# Patient Record
Sex: Male | Born: 1975 | Marital: Single | Smoking: Never smoker
Health system: Southern US, Community
[De-identification: ages and names within clinical notes are randomized; demographics above are authoritative.]

## PROBLEM LIST (undated history)

## (undated) DIAGNOSIS — K219 Gastro-esophageal reflux disease without esophagitis: Secondary | ICD-10-CM

## (undated) DIAGNOSIS — J309 Allergic rhinitis, unspecified: Secondary | ICD-10-CM

## (undated) DIAGNOSIS — T7840XA Allergy, unspecified, initial encounter: Secondary | ICD-10-CM

## (undated) HISTORY — PX: APPENDECTOMY: SHX54

## (undated) HISTORY — DX: Gastro-esophageal reflux disease without esophagitis: K21.9

## (undated) HISTORY — DX: Allergic rhinitis, unspecified: J30.9

## (undated) HISTORY — DX: Allergy, unspecified, initial encounter: T78.40XA

---

## 2004-10-31 ENCOUNTER — Ambulatory Visit: Payer: Self-pay | Admitting: Internal Medicine

## 2005-06-22 ENCOUNTER — Ambulatory Visit: Payer: Self-pay | Admitting: Internal Medicine

## 2006-05-10 ENCOUNTER — Ambulatory Visit: Payer: Self-pay | Admitting: Internal Medicine

## 2006-05-10 ENCOUNTER — Inpatient Hospital Stay (HOSPITAL_COMMUNITY): Admission: EM | Admit: 2006-05-10 | Discharge: 2006-05-11 | Payer: Self-pay | Admitting: Emergency Medicine

## 2006-05-10 ENCOUNTER — Ambulatory Visit: Payer: Self-pay | Admitting: *Deleted

## 2006-05-11 ENCOUNTER — Encounter (INDEPENDENT_AMBULATORY_CARE_PROVIDER_SITE_OTHER): Payer: Self-pay | Admitting: *Deleted

## 2007-05-29 ENCOUNTER — Ambulatory Visit: Payer: Self-pay | Admitting: Internal Medicine

## 2007-05-29 LAB — CONVERTED CEMR LAB: Hgb A1c MFr Bld: 6.2 % — ABNORMAL HIGH (ref 4.6–6.0)

## 2007-08-27 DIAGNOSIS — J309 Allergic rhinitis, unspecified: Secondary | ICD-10-CM

## 2007-08-27 HISTORY — DX: Allergic rhinitis, unspecified: J30.9

## 2007-09-15 ENCOUNTER — Ambulatory Visit: Payer: Self-pay | Admitting: Internal Medicine

## 2007-09-15 DIAGNOSIS — B369 Superficial mycosis, unspecified: Secondary | ICD-10-CM | POA: Insufficient documentation

## 2007-11-25 ENCOUNTER — Ambulatory Visit: Payer: Self-pay | Admitting: Internal Medicine

## 2007-11-25 DIAGNOSIS — J069 Acute upper respiratory infection, unspecified: Secondary | ICD-10-CM | POA: Insufficient documentation

## 2007-11-25 DIAGNOSIS — R300 Dysuria: Secondary | ICD-10-CM

## 2007-11-25 LAB — CONVERTED CEMR LAB: Blood Glucose, Fingerstick: 134

## 2008-03-26 ENCOUNTER — Ambulatory Visit: Payer: Self-pay | Admitting: Family Medicine

## 2008-05-04 ENCOUNTER — Ambulatory Visit: Payer: Self-pay | Admitting: Internal Medicine

## 2008-05-04 DIAGNOSIS — K5289 Other specified noninfective gastroenteritis and colitis: Secondary | ICD-10-CM

## 2008-05-04 DIAGNOSIS — K219 Gastro-esophageal reflux disease without esophagitis: Secondary | ICD-10-CM

## 2008-05-04 HISTORY — DX: Gastro-esophageal reflux disease without esophagitis: K21.9

## 2008-08-03 ENCOUNTER — Ambulatory Visit: Payer: Self-pay | Admitting: Internal Medicine

## 2008-08-03 DIAGNOSIS — M542 Cervicalgia: Secondary | ICD-10-CM | POA: Insufficient documentation

## 2008-08-11 ENCOUNTER — Telehealth: Payer: Self-pay | Admitting: Internal Medicine

## 2009-06-02 ENCOUNTER — Ambulatory Visit: Payer: Self-pay | Admitting: Internal Medicine

## 2009-06-02 LAB — CONVERTED CEMR LAB: Blood Glucose, Fingerstick: 92

## 2010-02-22 ENCOUNTER — Ambulatory Visit: Payer: Self-pay | Admitting: Internal Medicine

## 2010-02-22 DIAGNOSIS — J029 Acute pharyngitis, unspecified: Secondary | ICD-10-CM | POA: Insufficient documentation

## 2010-02-22 LAB — CONVERTED CEMR LAB: Rapid Strep: NEGATIVE

## 2011-01-01 ENCOUNTER — Ambulatory Visit
Admission: RE | Admit: 2011-01-01 | Discharge: 2011-01-01 | Payer: Self-pay | Source: Home / Self Care | Attending: Internal Medicine | Admitting: Internal Medicine

## 2011-01-03 ENCOUNTER — Telehealth: Payer: Self-pay

## 2011-01-03 ENCOUNTER — Telehealth: Payer: Self-pay | Admitting: *Deleted

## 2011-01-03 DIAGNOSIS — H109 Unspecified conjunctivitis: Secondary | ICD-10-CM

## 2011-01-03 MED ORDER — SULFACETAMIDE SODIUM 10 % OP SOLN: 2.0000 [drp] | Freq: Four times a day (QID) | OPHTHALMIC | Status: AC

## 2011-01-03 NOTE — Telephone Encounter (Signed)
[facility] Triage Call Report Triage Record Num: [id number] Operator: Vernell Barrier Patient Name: Todd Carroll Call Date & Time: 01/02/2011 7:24:51PM Patient Phone: [phone] PCP: Gordy Savers Patient Gender: Male PCP Fax : [phone] Patient DOB: May 18, 1976 [facility] Name[facility] Reason for Call: RN tried calling pt back but no answer. Msg left on VM. Protocol(s) Used: Office Note Recommended Outcome per Protocol: Information Noted and Sent to Office Reason for Outcome: Caller information to office Care Advice: ~ 01/02/2011 7:27:40PM Page 1 of 1 [email or web address]

## 2011-01-03 NOTE — Assessment & Plan Note (Signed)
Summary: COUGH/SORE THROAT/CJR   Vital Signs:  Patient profile:   35 year old male Weight:      215 pounds Temp:     98.7 degrees F oral BP sitting:   110 / 74  (right arm) Cuff size:   regular  Vitals Entered By: Duard Brady LPN (February 22, 2010 11:44 AM) CC: c/o productive cough , sore throat - exposed to strep Is Patient Diabetic? No   CC:  c/o productive cough  and sore throat - exposed to strep.  History of Present Illness: 35 year old patient who presents with a two-day history of cold, cough, and congestion.  His also noticed a mild sore throat.  There has been a history of strep exposure in the household.  No fever, cervical adenopathy, chills.  No prior history of strep throat denies any chest pain or shortness of breath.  There is no productive cough.  Preventive Screening-Counseling & Management  Alcohol-Tobacco     Smoking Status: never  Allergies (verified): No Known Drug Allergies  Social History: Smoking Status:  never  Review of Systems       The patient complains of anorexia and hoarseness.  The patient denies fever, weight loss, weight gain, vision loss, decreased hearing, chest pain, syncope, dyspnea on exertion, peripheral edema, prolonged cough, headaches, hemoptysis, abdominal pain, melena, hematochezia, severe indigestion/heartburn, hematuria, incontinence, genital sores, muscle weakness, suspicious skin lesions, transient blindness, difficulty walking, depression, unusual weight change, abnormal bleeding, enlarged lymph nodes, angioedema, breast masses, and testicular masses.    Physical Exam  General:  Well-developed,well-nourished,in no acute distress; alert,appropriate and cooperative throughout examination Head:  Normocephalic and atraumatic without obvious abnormalities. No apparent alopecia or balding. Eyes:  No corneal or conjunctival inflammation noted. EOMI. Perrla. Funduscopic exam benign, without hemorrhages, exudates or papilledema.  Vision grossly normal. Ears:  External ear exam shows no significant lesions or deformities.  Otoscopic examination reveals clear canals, tympanic membranes are intact bilaterally without bulging, retraction, inflammation or discharge. Hearing is grossly normal bilaterally. Nose:  External nasal examination shows no deformity or inflammation. Nasal mucosa are pink and moist without lesions or exudates. Mouth:  pharyngeal erythema.   Neck:  No deformities, masses, or tenderness noted. no adenopathy Lungs:  Normal respiratory effort, chest expands symmetrically. Lungs are clear to auscultation, no crackles or wheezes. Heart:  Normal rate and regular rhythm. S1 and S2 normal without gallop, murmur, click, rub or other extra sounds.   Impression & Recommendations:  Problem # 1:  SORE THROAT (ICD-462)  The following medications were removed from the medication list:    Naprosyn 500 Mg Tabs (Naproxen) ..... One by mouth two times a day for pain  Orders: Rapid Strep (16109)  Problem # 2:  URI (ICD-465.9)  The following medications were removed from the medication list:    Naprosyn 500 Mg Tabs (Naproxen) ..... One by mouth two times a day for pain His updated medication list for this problem includes:    Hydrocodone-homatropine 5-1.5 Mg/88ml Syrp (Hydrocodone-homatropine) .Marland Kitchen... 1 teaspoon every 6 hours as needed for cough  Problem # 3:  GERD (ICD-530.81)  His updated medication list for this problem includes:    Zegerid 20-1100 Mg Caps (Omeprazole-sodium bicarbonate) ..... Once daily  Complete Medication List: 1)  Zegerid 20-1100 Mg Caps (Omeprazole-sodium bicarbonate) .... Once daily 2)  Hydrocodone-homatropine 5-1.5 Mg/67ml Syrp (Hydrocodone-homatropine) .Marland Kitchen.. 1 teaspoon every 6 hours as needed for cough  Patient Instructions: 1)  Get plenty of rest, drink lots of clear liquids, and use  Tylenol or Ibuprofen for fever and comfort. Return in 7-10 days if you're not better:sooner if you're  feeling worse. Prescriptions: HYDROCODONE-HOMATROPINE 5-1.5 MG/5ML SYRP (HYDROCODONE-HOMATROPINE) 1 teaspoon every 6 hours as needed for cough  #6 oz x 0   Entered and Authorized by:   Gordy Savers  MD   Signed by:   Gordy Savers  MD on 02/22/2010   Method used:   Print then Give to Patient   RxID:   [id number] ZEGERID 20-1100 MG  CAPS (OMEPRAZOLE-SODIUM BICARBONATE) once daily  #90 x 4   Entered and Authorized by:   Gordy Savers  MD   Signed by:   Gordy Savers  MD on 02/22/2010   Method used:   Print then Give to Patient   RxID:   [id number]   Laboratory Results    Other Tests  Rapid Strep: negative Comments: Rita Ohara  February 22, 2010 12:09 PM   Kit Test Internal QC: Negative   (Normal Range: Negative)

## 2011-01-03 NOTE — Telephone Encounter (Signed)
[facility] Triage Call Report Triage Record Num: [id number] Operator: April Finney Patient Name: Todd Carroll Call Date & Time: 01/02/2011 7:25:25PM Patient Phone: [phone] PCP: Patient Gender: Male PCP Fax : Patient DOB: 06/07/1976 [facility] Name[facility] Reason for Call: [name] calling about lost Rx for Eye drops Sulsactimedz 10% generic for Sulfac eye drops. Unable to find info on eye drops. Instructed him to call back in am. Verbalized understanding. Protocol(s) Used: Office Note Recommended Outcome per Protocol: Information Noted and Sent to Office Reason for Outcome: Caller information to office Care Advice: ~ 01/02/2011 7:45:10PM Page 1 of 1 [email or web address]

## 2011-01-03 NOTE — Telephone Encounter (Signed)
VM from pt - requesting new rx for eye drops - his got thrown away. Call into [facility] [address].

## 2011-01-10 ENCOUNTER — Encounter: Payer: Self-pay | Admitting: Internal Medicine

## 2011-01-10 NOTE — Assessment & Plan Note (Signed)
Summary: 2 LUMPS ON BACK OF HEAD/SOME BLURRED VISION/COUGHING/CJR   Vital Signs:  Patient profile:   35 year old male Height:      71.5 inches Weight:      223 pounds BMI:     30.78 Temp:     99.2 degrees F oral BP sitting:   120 / 70  (right arm) Cuff size:   regular  Vitals Entered By: Duard Brady LPN (January 01, 2011 10:19 AM) CC: c/o coughing  , nasal congestion check lumps on either side of neck , dizzy when bending over Is Patient Diabetic? No   CC:  c/o coughing  , nasal congestion check lumps on either side of neck , and dizzy when bending over.  History of Present Illness: 35 for history chest congestion, and cough.  He states that his been expectorating a small amounts of blood and also describes sore throat, nasal congestion, and redness involving the right eye.  He has had low-grade fever.  He is a nonsmoker.  For the past 4 days, he has  also noted two small painful nodules involving the posterior scalp area. He has a history of gastroesophageal disease and request a prescription for Nexium.  He also requests a form completion for a prescription saving  program with [employer].  Preventive Screening-Counseling & Management  Alcohol-Tobacco     Smoking Status: never  Allergies (verified): No Known Drug Allergies  Past History:  Past Medical History: Reviewed history from 05/04/2008 and no changes required. Allergic rhinitis GERD  Social History: Reviewed history from 03/26/2008 and no changes required. Single no health insurance Current Smoker Alcohol use-yes Occupation: Former Smoker Drug use-no  Review of Systems       The patient complains of anorexia, hoarseness, and prolonged cough.  The patient denies fever, weight loss, weight gain, vision loss, decreased hearing, chest pain, syncope, dyspnea on exertion, peripheral edema, headaches, hemoptysis, abdominal pain, melena, hematochezia, severe indigestion/heartburn, hematuria, incontinence,  genital sores, muscle weakness, suspicious skin lesions, transient blindness, difficulty walking, depression, unusual weight change, abnormal bleeding, enlarged lymph nodes, angioedema, breast masses, and testicular masses.    Physical Exam  General:  Well-developed,well-nourished,in no acute distress; alert,appropriate and cooperative throughout examination Head:  Normocephalic and atraumatic without obvious abnormalities. No apparent alopecia or balding. Eyes:  mild right conjunctivitisvision grossly intact, pupils equal, pupils round, and pupils reactive to light.  vision grossly intact, pupils equal, pupils round, and pupils reactive to light.   Ears:  External ear exam shows no significant lesions or deformities.  Otoscopic examination reveals clear canals, tympanic membranes are intact bilaterally without bulging, retraction, inflammation or discharge. Hearing is grossly normal bilaterally. Nose:  External nasal examination shows no deformity or inflammation. Nasal mucosa are pink and moist without lesions or exudates. Mouth:  mild erythema of the oropharynx Neck:  No deformities, masses, or tenderness noted. Lungs:  Normal respiratory effort, chest expands symmetrically. Lungs are clear to auscultation, no crackles or wheezes. Heart:  Normal rate and regular rhythm. S1 and S2 normal without gallop, murmur, click, rub or other extra sounds. Skin:  two small, approximately 0.5-cm nodules in the posterior scalp area.   are more consistent with inflamed sebaceous cyst and not adenopathy   Impression & Recommendations:  Problem # 1:  CONJUNCTIVITIS (ICD-372.30)  His updated medication list for this problem includes:    Sulfacetamide Sodium 10 % Soln (Sulfacetamide sodium) .Marland Kitchen..Marland Kitchen Two drops to right eye 4 times daily  His updated medication list for this problem  includes:    Sulfacetamide Sodium 10 % Soln (Sulfacetamide sodium) .Marland Kitchen..Marland Kitchen Two drops to right eye 4 times daily  Problem # 2:  GERD  (ICD-530.81)  The following medications were removed from the medication list:    Zegerid 20-1100 Mg Caps (Omeprazole-sodium bicarbonate) ..... Once daily His updated medication list for this problem includes:    Nexium 40 Mg Cpdr (Esomeprazole magnesium) ..... One daily, as needed for heartburn  The following medications were removed from the medication list:    Zegerid 20-1100 Mg Caps (Omeprazole-sodium bicarbonate) ..... Once daily His updated medication list for this problem includes:    Nexium 40 Mg Cpdr (Esomeprazole magnesium) ..... One daily, as needed for heartburn  Problem # 3:  URI (ICD-465.9)  His updated medication list for this problem includes:    Hydrocodone-homatropine 5-1.5 Mg/34ml Syrp (Hydrocodone-homatropine) .Marland Kitchen... 1 teaspoon every 6 hours as needed for cough  His updated medication list for this problem includes:    Hydrocodone-homatropine 5-1.5 Mg/8ml Syrp (Hydrocodone-homatropine) .Marland Kitchen... 1 teaspoon every 6 hours as needed for cough  Complete Medication List: 1)  Hydrocodone-homatropine 5-1.5 Mg/70ml Syrp (Hydrocodone-homatropine) .Marland Kitchen.. 1 teaspoon every 6 hours as needed for cough 2)  Nexium 40 Mg Cpdr (Esomeprazole magnesium) .... One daily, as needed for heartburn 3)  Sulfacetamide Sodium 10 % Soln (Sulfacetamide sodium) .... Two drops to right eye 4 times daily  Patient Instructions: 1)  Get plenty of rest, drink lots of clear liquids, and use Tylenol or Ibuprofen for fever and comfort. Return in 7-10 days if you're not better:sooner if you're feeling worse. Prescriptions: SULFACETAMIDE SODIUM 10 % SOLN (SULFACETAMIDE SODIUM) two drops to right eye 4 times daily  #10 cc x 0   Entered and Authorized by:   Gordy Savers  MD   Signed by:   Gordy Savers  MD on 01/01/2011   Method used:   Print then Give to Patient   RxID:   [id number] NEXIUM 40 MG CPDR (ESOMEPRAZOLE MAGNESIUM) one daily, as needed for heartburn  #90 x 6   Entered and Authorized  by:   Gordy Savers  MD   Signed by:   Gordy Savers  MD on 01/01/2011   Method used:   Print then Give to Patient   RxID:   [id number] HYDROCODONE-HOMATROPINE 5-1.5 MG/5ML SYRP (HYDROCODONE-HOMATROPINE) 1 teaspoon every 6 hours as needed for cough  #6 oz x 0   Entered and Authorized by:   Gordy Savers  MD   Signed by:   Gordy Savers  MD on 01/01/2011   Method used:   Print then Give to Patient   RxID:   [id number]    Orders Added: 1)  Est. Patient Level III [36644]

## 2011-01-11 ENCOUNTER — Ambulatory Visit: Payer: Self-pay | Admitting: Internal Medicine

## 2011-01-12 ENCOUNTER — Ambulatory Visit (INDEPENDENT_AMBULATORY_CARE_PROVIDER_SITE_OTHER): Payer: Self-pay | Admitting: Internal Medicine

## 2011-01-12 ENCOUNTER — Encounter: Payer: Self-pay | Admitting: Internal Medicine

## 2011-01-12 DIAGNOSIS — L299 Pruritus, unspecified: Secondary | ICD-10-CM

## 2011-01-12 DIAGNOSIS — J309 Allergic rhinitis, unspecified: Secondary | ICD-10-CM

## 2011-01-12 DIAGNOSIS — K219 Gastro-esophageal reflux disease without esophagitis: Secondary | ICD-10-CM

## 2011-01-12 MED ORDER — PREDNISONE 10 MG PO TABS: 10.0000 mg | ORAL_TABLET | Freq: Two times a day (BID) | ORAL | Status: AC

## 2011-01-12 NOTE — Patient Instructions (Signed)
Call or return to clinic prn if these symptoms worsen or fail to improve as anticipated.

## 2011-01-12 NOTE — Progress Notes (Signed)
  Subjective:    Patient ID: Todd Carroll, male    DOB: 04/18/1976, 35 y.o.   MRN: [id number]  HPI   35 year old patient who was seen here approximately 10 days ago and treated systematically for a URI.  Medical regimen included Hydromet.  For the past 4 or 5 days.  He has had some generalized itching without rash.  He did stop his medication for two days without much improvement.  He also describes some mild cough and persistent mild sore throat.  Both of which are improving.  There has been no fever.    Review of Systems  Constitutional: Negative for fever, chills, appetite change and fatigue.  HENT: Negative for hearing loss, ear pain, congestion, sore throat, trouble swallowing, neck stiffness, dental problem, voice change and tinnitus.   Eyes: Negative for pain, discharge and visual disturbance.  Respiratory: Negative for cough, chest tightness, wheezing and stridor.   Cardiovascular: Negative for chest pain, palpitations and leg swelling.  Gastrointestinal: Negative for nausea, vomiting, abdominal pain, diarrhea, constipation, blood in stool and abdominal distention.  Genitourinary: Negative for urgency, hematuria, flank pain, discharge, difficulty urinating and genital sores.  Musculoskeletal: Negative for myalgias, back pain, joint swelling, arthralgias and gait problem.  Skin: Negative for rash.  Neurological: Negative for dizziness, syncope, speech difficulty, weakness, numbness and headaches.  Hematological: Negative for adenopathy. Does not bruise/bleed easily.  Psychiatric/Behavioral: Negative for behavioral problems and dysphoric mood. The patient is not nervous/anxious.        Objective:   Physical Exam  Constitutional: He is oriented to person, place, and time. He appears well-developed.  HENT:  Head: Normocephalic.  Right Ear: External ear normal.  Left Ear: External ear normal.  Eyes: Conjunctivae and EOM are normal.  Neck: Normal range of motion.    Cardiovascular: Normal rate and normal heart sounds.   Pulmonary/Chest: Breath sounds normal.  Abdominal: Bowel sounds are normal.  Musculoskeletal: Normal range of motion. He exhibits no edema and no tenderness.  Neurological: He is alert and oriented to person, place, and time.  Skin: Skin is warm and dry. No rash noted.  Psychiatric: He has a normal mood and affect. His behavior is normal.          Assessment & Plan:  Pruritus-  Unclear etiology, but may be related to the Hydromet.  This will be discontinued.  Will treat with 5 days of oral prednisone URI, improving GastroEsophageal reflux disease, stable

## 2011-02-16 ENCOUNTER — Telehealth: Payer: Self-pay | Admitting: Internal Medicine

## 2011-02-16 NOTE — Telephone Encounter (Signed)
Pt called and said that he still has sorethroat.  Tonsils are swollen. Pt says that med prescribed didn't help. Pt  req work in appt today or different med called in to [address]

## 2011-02-16 NOTE — Telephone Encounter (Signed)
Okay for work in  if patient desires or May have a rapid strep screen performed at the lab only without RV

## 2011-02-16 NOTE — Telephone Encounter (Signed)
Please advise 

## 2011-02-19 ENCOUNTER — Ambulatory Visit (INDEPENDENT_AMBULATORY_CARE_PROVIDER_SITE_OTHER): Payer: Self-pay | Admitting: Internal Medicine

## 2011-02-19 ENCOUNTER — Encounter: Payer: Self-pay | Admitting: Internal Medicine

## 2011-02-19 VITALS — BP 110/72 | Temp 99.3°F | Wt 221.0 lb

## 2011-02-19 DIAGNOSIS — J029 Acute pharyngitis, unspecified: Secondary | ICD-10-CM

## 2011-02-19 DIAGNOSIS — J3501 Chronic tonsillitis: Secondary | ICD-10-CM

## 2011-02-19 LAB — POCT RAPID STREP A (OFFICE): Rapid Strep A Screen: NEGATIVE

## 2011-02-19 MED ORDER — AMOXICILLIN-POT CLAVULANATE 875-125 MG PO TABS: 1.0000 | ORAL_TABLET | Freq: Two times a day (BID) | ORAL | Status: DC

## 2011-02-19 NOTE — Patient Instructions (Signed)
Get plenty of rest, Drink lots of  clear liquids, and use Tylenol or ibuprofen for fever and discomfort.    Take your antibiotic as prescribed until ALL of it is gone, but stop if you develop a rash, swelling, or any side effects of the medication.  Contact our office as soon as possible if  there are side effects of the medication.  Call or return to clinic prn if these symptoms worsen or fail to improve as anticipated.   

## 2011-02-19 NOTE — Progress Notes (Signed)
  Subjective:    Patient ID: Todd Carroll, male    DOB: [birth year], 35 y.o.   MRN: [id number]  HPI   35 year old patient who is seen today for evaluation of sore throat this has been present for at least 2 months and he has had at least 2 prior office visits related to sore throat. He has low-grade fever and a general sense of unwellness he does have a history of allergic rhinitis but very little in the way of other symptoms at this time has mild rhinorrhea. No real cough or significant sinus congestion. His throat has been chronically inflamed and sore for several weeks without periods of resolution   Review of Systems  Constitutional: Positive for fever and fatigue. Negative for chills and appetite change.  HENT: Negative for hearing loss, ear pain, congestion, sore throat, trouble swallowing, neck stiffness, dental problem, voice change and tinnitus.   Eyes: Negative for pain, discharge and visual disturbance.  Respiratory: Negative for cough, chest tightness, wheezing and stridor.   Cardiovascular: Negative for chest pain, palpitations and leg swelling.  Gastrointestinal: Negative for nausea, vomiting, abdominal pain, diarrhea, constipation, blood in stool and abdominal distention.  Genitourinary: Negative for urgency, hematuria, flank pain, discharge, difficulty urinating and genital sores.  Musculoskeletal: Negative for myalgias, back pain, joint swelling, arthralgias and gait problem.  Skin: Negative for rash.  Neurological: Negative for dizziness, syncope, speech difficulty, weakness, numbness and headaches.  Hematological: Negative for adenopathy. Does not bruise/bleed easily.  Psychiatric/Behavioral: Negative for behavioral problems and dysphoric mood. The patient is not nervous/anxious.        Objective:   Physical Exam  Constitutional: He is oriented to person, place, and time. He appears well-developed and well-nourished.  HENT:  Head: Normocephalic.  Right Ear: External  ear normal.  Left Ear: External ear normal.        Tonsils were enlarged and erythematous. No exudate noted. Negative rapid strep  Eyes: Conjunctivae and EOM are normal.  Neck: Normal range of motion. Neck supple.  Cardiovascular: Normal rate and normal heart sounds.   Pulmonary/Chest: Breath sounds normal.  Abdominal: Bowel sounds are normal.  Musculoskeletal: Normal range of motion. He exhibits no edema and no tenderness.  Lymphadenopathy:    He has no cervical adenopathy.  Neurological: He is alert and oriented to person, place, and time.  Psychiatric: He has a normal mood and affect. His behavior is normal.          Assessment & Plan:   chronic tonsillitis. Patient has been symptomatic for a number of weeks he has low-grade fever. Will treat with 10 days of Augmentin and will also continue symptomatic treatment.

## 2011-04-05 ENCOUNTER — Ambulatory Visit (INDEPENDENT_AMBULATORY_CARE_PROVIDER_SITE_OTHER): Payer: Self-pay | Admitting: Internal Medicine

## 2011-04-05 ENCOUNTER — Encounter: Payer: Self-pay | Admitting: Internal Medicine

## 2011-04-05 DIAGNOSIS — K219 Gastro-esophageal reflux disease without esophagitis: Secondary | ICD-10-CM

## 2011-04-05 DIAGNOSIS — J309 Allergic rhinitis, unspecified: Secondary | ICD-10-CM

## 2011-04-05 DIAGNOSIS — L639 Alopecia areata, unspecified: Secondary | ICD-10-CM

## 2011-04-05 NOTE — Patient Instructions (Signed)
Call or return to clinic prn if these symptoms worsen or fail to improve as anticipated.

## 2011-04-05 NOTE — Progress Notes (Signed)
  Subjective:    Patient ID: Todd Carroll, male    DOB: 12/29/1975, 35 y.o.   MRN: [id number]  HPI  35 year old patient who has a history of gastro esophageal reflux disease and allergic rhinitis. He has been stable. His chief complaint today is a concern about a scalp dermatitis. There is no itching or history of rash    Review of Systems  Constitutional: Negative for fever, chills, appetite change and fatigue.  HENT: Negative for hearing loss, ear pain, congestion, sore throat, trouble swallowing, neck stiffness, dental problem, voice change and tinnitus.   Eyes: Negative for pain, discharge and visual disturbance.  Respiratory: Negative for cough, chest tightness, wheezing and stridor.   Cardiovascular: Negative for chest pain, palpitations and leg swelling.  Gastrointestinal: Negative for nausea, vomiting, abdominal pain, diarrhea, constipation, blood in stool and abdominal distention.  Genitourinary: Negative for urgency, hematuria, flank pain, discharge, difficulty urinating and genital sores.  Musculoskeletal: Negative for myalgias, back pain, joint swelling, arthralgias and gait problem.  Skin: Negative for rash.  Neurological: Negative for dizziness, syncope, speech difficulty, weakness, numbness and headaches.  Hematological: Negative for adenopathy. Does not bruise/bleed easily.  Psychiatric/Behavioral: Negative for behavioral problems and dysphoric mood. The patient is not nervous/anxious.        Objective:   Physical Exam  Constitutional: He appears well-developed. No distress.  Skin: Skin is warm and dry. No rash noted. No erythema.       Examination the scalp revealed multiple diffuse patchy areas of alopecia 1/2-1 cm in diameter. The scalp appear to be normal;  hair was cut very short          Assessment & Plan:   Alopecia areata. Patient was told the most likely diagnosis. He will attempt to grow his hair slightly longer for cosmetic purposes. We'll consider  dermatologic referral if there is any progression

## 2011-04-20 NOTE — Op Note (Signed)
Todd Carroll, Todd Carroll           ACCOUNT NO.:  [id number]   MEDICAL RECORD NO.:  [id number]          PATIENT TYPE:  INP   LOCATION:  5727                         FACILITY:  [facility]   PHYSICIAN:  Velora Heckler, MD      DATE OF BIRTH:  08/21/1976   DATE OF PROCEDURE:  05/11/2006  DATE OF DISCHARGE:                                 OPERATIVE REPORT   PREOPERATIVE DIAGNOSIS:  Acute appendicitis.   POSTOPERATIVE DIAGNOSIS:  Acute appendicitis.   PROCEDURE:  Laparoscopic appendectomy.   SURGEON:  Velora Heckler, MD, FACS   ANESTHESIA:  General per Dr. Claybon Jabs.   ESTIMATED BLOOD LOSS:  Minimal   PREPARATION:  Betadine.   COMPLICATIONS:  None.   INDICATIONS:  The patient is a 35 year old black male from [place], [place], who presented to [facility] with a 24-hour history of  abdominal pain, diarrhea and nausea.  Pain localized to the right lower  quadrant.  CT scan of abdomen and pelvis obtained at [facility]  had findings consistent with acute appendicitis.  The patient was admitted  to the medical service at [facility] General Surgery was  consulted.   BODY OF REPORT:  Procedure is done in OR #16 at the [facility].  The patient is brought to the operating room and placed in a  supine position on the operating room table.  Following the administration  of general anesthesia, the patient is prepped and draped in the usual strict  aseptic fashion.  After ascertaining that an adequate level of anesthesia  had been obtained, a supraumbilical incision was made in the midline with a  #15 blade.  Dissection is carried down to the fascia.  Fascia is incised in  the midline and the peritoneal cavity is entered cautiously.  A 0 Vicryl  pursestring sutures is placed in the fascia.  An Hasson cannula is  introduced and secured with a pursestring suture.  The abdomen is  insufflated with carbon dioxide.  Operative ports are  placed in the right  upper quadrant and left lower quadrant.  Appendix is visible.  Base of the  appendix is mobilized.  Appendix extends laterally and inferiorly around the  base of the cecum.  Therefore, a window is created at the base of the  appendix.  The base of the appendix is then transected at its junction with  the cecum with an Endo-GIA stapler using a vascular cartridge.  Mesoappendix  is taken down using the harmonic scalpel for hemostasis.  The appendix is  then dissected out laterally and inferiorly off of the wall of the cecum  until it is completely freed.  It is placed into an EndoCatch bag and  withdrawn through the umbilical port without difficulty.  There is no  evidence of perforation.  Right lower quadrant is irrigated with warm  saline.  Good hemostasis is noted.  Staple line is intact.  Fluid is  evacuated.  Ports are removed under direct vision and good hemostasis is  noted at all port sites.  Pneumoperitoneum is released.  The 0  Vicryl  pursestring suture is tied securely.  Port sites were anesthetized with  local anesthetic.  All wounds are closed with interrupted 4-0 Vicryl  subcuticular sutures.  Wounds are washed and dried and Benzoin and Steri-  Strips are applied.  Sterile dressings are applied.  The patient is awakened  from anesthesia and brought to the recovery room in stable condition.  The  patient tolerated the procedure well.      Velora Heckler, MD  Electronically Signed     TMG/MEDQ  D:  05/11/2006  T:  05/13/2006  Job:  [id number]   cc:   Gordy Savers, M.D. [facility]  [address] [place]  [place] [address]   Rod Holler, MD  [address]  [place], [place] [address]

## 2011-04-20 NOTE — Consult Note (Signed)
[name], Carroll           ACCOUNT NO.:  [id number]   MEDICAL RECORD NO.:  [id number]          PATIENT TYPE:  INP   LOCATION:  5727                         FACILITY:  [facility]   PHYSICIAN:  Velora Heckler, MD      DATE OF BIRTH:  October 23, 1976   DATE OF CONSULTATION:  05/10/2006  DATE OF DISCHARGE:                                   CONSULTATION   REASON FOR CONSULTATION:  Abdominal pain, acute appendicitis.   HISTORY OF PRESENT ILLNESS:  Todd Carroll is a pleasant 35 year old  black male from [place], [place], admitted by [facility]  for abdominal pain. The patient has greater than a 24-hour history of  abdominal pain. This was initially sharp in the right upper quadrant. It was  associated with nausea and diarrhea. The pain shifted to the right lower  quadrant. The patient was seen at Dr. Nathen May office at  [facility]. He was noted to have an elevated white blood cell count of  13,000. CT scan of the abdomen and pelvis were obtained at [facility]  [facility] and was felt to be consistent with acute appendicitis. The patient  was moved to [facility] and was admitted on admitted on [facility] medical service. General surgery was consulted.   PAST MEDICAL HISTORY:  No prior surgeries, no significant medical  conditions.   MEDICATIONS:  Zyrtec and Flonase.   ALLERGIES:  Seasonal only; no known drug allergies.   SOCIAL HISTORY:  The patient lives in [place]. He is currently  unemployed. He used to work at [employer]. He denies tobacco use. He  drinks alcohol on occasion.   FAMILY HISTORY:  Noncontributory.   REVIEW OF SYSTEMS:  A 15-system review without significant other positives.   PHYSICAL EXAMINATION:  GENERAL: A 35 year old, well-developed, well-  nourished black male in ward 5700 at [facility].  VITAL SIGNS: Temperature 97.9, pulse 84, respirations 20, blood pressure  116/90, O2 saturation 98% on  room air.  HEENT: Normocephalic and atraumatic. Sclerae clear. Conjunctiva clear.  Pupils equal and reactive. Dentition good. Mucous membranes moist. Voice  normal. There is a piercing in the right ear.  NECK: Palpation of the neck shows no mass, no nodularity, no  lymphadenopathy, no tenderness.  LUNGS: Clear to auscultation bilaterally without rales, rhonchi, or wheezes.  CARDIOVASCULAR: Regular rate and rhythm without murmurs. Peripheral pulses  are full.  ABDOMEN: Soft, without distention. There are a few bowel sounds on  auscultation. There is a small umbilical hernia which is asymptomatic. There  is tenderness to palpation in the right mid abdomen and right lower  quadrant. There is voluntary guarding. There is rebound tenderness in the  right lower quadrant.  EXTREMITIES: Nontender without edema.  NEUROLOGIC: The patient alert and oriented without focal neurologic  deficits.   LABORATORY DATA:  White count 13.3, hemoglobin 14.8.  Electrolytes are  normal.  Liver function tests are normal.   RADIOGRAPHIC STUDIES:  CT scan of the abdomen and pelvis from outside  radiographer noted to be consistent with acute appendicitis by [facility]  radiology department.  IMPRESSION:  Acute appendicitis.   PLAN:  1.  Admission to [facility] per [facility].  2.  Initiation of intravenous antibiotics.  3.  To the operating room for appendectomy.  4.  Routine postoperative care.      Velora Heckler, MD  Electronically Signed     TMG/MEDQ  D:  05/10/2006  T:  05/11/2006  Job:  [id number]   cc:   Rod Holler, MD  [address]  [place], [place] [address]   Gordy Savers, M.D. [facility]  [address] [place]  [place] [address]

## 2011-04-20 NOTE — H&P (Signed)
NAMEVENANCIO, CHENIER           ACCOUNT NO.:  [id number]   MEDICAL RECORD NO.:  [id number]          PATIENT TYPE:  INP   LOCATION:  5727                         [facility]AC[facility]  [facility]   PHYSICIAN:  [facility] OF B[facility]:  11-07-1976   DATE OF ADMISSION:  05/10/2006  DATE OF DISCHARGE:                                HISTORY & PHYSICAL   CHIEF COMPLAINT:  Abdominal pain.   HISTORY OF PRESENT ILLNESS:  Mr. Welcher is a 35 year old male began to have  diarrhea last night and some right upper quadrant discomfort.  This morning,  he had right upper quadrant sharp pain, worse than last night.  He had no  further diarrhea.  No nausea or emesis.  His last bowel movement was last  night.  He has no complaints of fevers or chills.  His last meal was at 11  a.m.  He had a CT scan of his abdomen at his PCP's office, reportedly  consistent with acute appendicitis.   PAST MEDICAL HISTORY:  Seasonal allergies.   MEDICINES:  Zyrtec, Flonase.   ALLERGIES:  No known drug allergies.   SOCIAL HISTORY:  The patient denies tobacco, occasionally uses alcohol.  He  does not work and lives in [place].   FAMILY HISTORY:  Noncontributory.   REVIEW OF SYSTEMS:  All systems reviewed in detail and are negative except  as noted in the history of present illness.   PHYSICAL EXAMINATION:  VITAL SIGNS:  Temperature 97.5, heart rate 84,  respiratory rate 20, blood pressure 115/80, oxygen saturation 98% on room  air.  GENERAL:  A well-developed well-nourished male, alert and oriented x3, no  apparent distress.  HEENT:  Atraumatic, normocephalic.  Pupils are equal, round, and reactive to  light.  Extraocular movements intact.  Oropharynx clear.  NECK:  Supple.  No adenopathy.  No JVD.  No carotid bruits.  CHEST:  Lungs clear to auscultation bilaterally with equal bilateral breath  sounds.  CORONARY:  Regular rhythm.  Normal rate.  Normal S1 S2.  No murmurs, rubs,  or gallops.  Peripheral  pulses 2+.  ABDOMEN:  Soft, tender to soft palpation in his right lower quadrant.  Normoactive bowel sounds.  No rebound or guarding.  EXTREMITIES:  No clubbing, cyanosis, or edema.  NEUROLOGIC:  No focal deficits.  Normal affect.  SKIN:  No rashes.   LABORATORY:  White blood cell count 13.3, hematocrit 44.3, platelets 367.  Urinalysis:  Specific gravity greater than 1.046.  Lipase 15.  Sodium 134,  potassium 4, chloride 105, bicarb 25, BUN 8, creatinine 1.1, glucose 107.  Total bilirubin 0.6, alk phos 111, SGOT of 21, SGPT of 25, total protein  7.3, albumin 4.1, calcium 9.3.   IMPRESSION:  A 35 year old male with right lower quadrant pain.  CT scan  reportedly consistent with acute appendicitis.   PLAN:  1.  I have consulted the general surgery service that is on call tonight and      they will evaluate the patient.  2.  NPO.  3.  Normal saline at 125 cc/hr.  4.  Labs in the morning, BMP and CBC.  Rod Holler, MD  Electronically Signed     ______________________________  [facility]    TRK/MEDQ  D:  05/10/2006  T:  05/10/2006  Job:  [id number]

## 2011-05-07 ENCOUNTER — Ambulatory Visit (INDEPENDENT_AMBULATORY_CARE_PROVIDER_SITE_OTHER): Payer: Self-pay | Admitting: Internal Medicine

## 2011-05-07 ENCOUNTER — Encounter: Payer: Self-pay | Admitting: Internal Medicine

## 2011-05-07 DIAGNOSIS — R197 Diarrhea, unspecified: Secondary | ICD-10-CM

## 2011-05-07 DIAGNOSIS — J309 Allergic rhinitis, unspecified: Secondary | ICD-10-CM

## 2011-05-07 MED ORDER — DIPHENOXYLATE-ATROPINE 2.5-0.025 MG PO TABS: 1.0000 | ORAL_TABLET | Freq: Four times a day (QID) | ORAL | Status: AC | PRN

## 2011-05-07 NOTE — Progress Notes (Signed)
  Subjective:    Patient ID: Todd Carroll, male    DOB: [birth year], 35 y.o.   MRN: [id number]  HPI  35 year old patient who presents with a chief complaint of diarrhea for the past 4 days. He is also noticed some mild lower abdominal discomfort. There is a 90 had some brief chills. His main complaint today is loose stool. No nausea vomiting or change in his appetite. Denies any fever. He has a history of allergic rhinitis and gastroesophageal reflux disease    Review of Systems  Constitutional: Negative for fever, chills, appetite change and fatigue.  HENT: Negative for hearing loss, ear pain, congestion, sore throat, trouble swallowing, neck stiffness, dental problem, voice change and tinnitus.   Eyes: Negative for pain, discharge and visual disturbance.  Respiratory: Negative for cough, chest tightness, wheezing and stridor.   Cardiovascular: Negative for chest pain, palpitations and leg swelling.  Gastrointestinal: Positive for diarrhea. Negative for nausea, vomiting, abdominal pain, constipation, blood in stool and abdominal distention.  Genitourinary: Negative for urgency, hematuria, flank pain, discharge, difficulty urinating and genital sores.  Musculoskeletal: Negative for myalgias, back pain, joint swelling, arthralgias and gait problem.  Skin: Negative for rash.  Neurological: Negative for dizziness, syncope, speech difficulty, weakness, numbness and headaches.  Hematological: Negative for adenopathy. Does not bruise/bleed easily.  Psychiatric/Behavioral: Negative for behavioral problems and dysphoric mood. The patient is not nervous/anxious.        Objective:   Physical Exam  Constitutional: He is oriented to person, place, and time. He appears well-developed.       Mildly overweight. No distress. Afebrile. Normal blood pressure.  HENT:  Head: Normocephalic.  Right Ear: External ear normal.  Left Ear: External ear normal.  Eyes: Conjunctivae and EOM are normal.  Neck:  Normal range of motion.  Cardiovascular: Normal rate and normal heart sounds.   Pulmonary/Chest: Breath sounds normal.  Abdominal: Bowel sounds are normal. He exhibits no distension. There is no tenderness. There is no rebound.  Musculoskeletal: Normal range of motion. He exhibits no edema and no tenderness.  Neurological: He is alert and oriented to person, place, and time.  Psychiatric: He has a normal mood and affect. His behavior is normal.          Assessment & Plan:   Diarrhea. Probable viral gastroenteritis. Will treat with align for 7 days and also a prescription for an antidiarrheal We'll call if he does not improve

## 2011-05-07 NOTE — Patient Instructions (Signed)
Drink clear liquids only for the next 24 hours,  then slowly add other liquids and food as you  tolerate them  ALIGN one daily   Call or return to clinic prn if these symptoms worsen or fail to improve as anticipated.  

## 2011-07-09 ENCOUNTER — Other Ambulatory Visit: Payer: Self-pay | Admitting: Internal Medicine

## 2011-09-14 ENCOUNTER — Ambulatory Visit (INDEPENDENT_AMBULATORY_CARE_PROVIDER_SITE_OTHER): Payer: Self-pay | Admitting: Internal Medicine

## 2011-09-14 ENCOUNTER — Encounter: Payer: Self-pay | Admitting: Internal Medicine

## 2011-09-14 DIAGNOSIS — R350 Frequency of micturition: Secondary | ICD-10-CM

## 2011-09-14 DIAGNOSIS — Z0000 Encounter for general adult medical examination without abnormal findings: Secondary | ICD-10-CM

## 2011-09-14 DIAGNOSIS — Z23 Encounter for immunization: Secondary | ICD-10-CM

## 2011-09-14 LAB — POCT URINALYSIS DIPSTICK
Bilirubin, UA: NEGATIVE
Blood, UA: NEGATIVE
Ketones, UA: NEGATIVE
Leukocytes, UA: NEGATIVE
Nitrite, UA: NEGATIVE
Protein, UA: NEGATIVE
pH, UA: 8

## 2011-09-14 NOTE — Patient Instructions (Signed)
It is important that you exercise regularly, at least 20 minutes 3 to 4 times per week.  If you develop chest pain or shortness of breath seek  medical attention.  You need to lose weight.  Consider a lower calorie diet and regular exercise.  Call or return to clinic prn if these symptoms worsen or fail to improve as anticipated.  

## 2011-09-14 NOTE — Progress Notes (Signed)
  Subjective:    Patient ID: Todd Carroll, male    DOB: 02/27/1976, 35 y.o.   MRN: [id number]  HPI  Wt Readings from Last 3 Encounters:  09/14/11 230 lb (104.327 kg)  05/07/11 227 lb (102.967 kg)  04/05/11 227 lb (102.967 kg)    A 34 year old patient who presents with a chief complaint of urinary frequency. He was concerned about diabetes or a bladder infection. Denies any dysuria hematuria fever or flank pain. A random blood sugar was normal and urinalysis revealed normal findings;  specific gravity was low  Review of Systems  Genitourinary: Positive for frequency.       Objective:   Physical Exam  Constitutional: He appears well-developed and well-nourished. No distress.       Overweight. Blood pressure normal          Assessment & Plan:   Urinary frequency. Normal UA and no evidence of diabetes both parents have diabetes and he should lose 40 pounds to obtain a normal BMI of 25. This was discussed at length. He was reassured

## 2011-10-22 ENCOUNTER — Other Ambulatory Visit: Payer: Self-pay | Admitting: Internal Medicine

## 2011-10-22 MED ORDER — ESOMEPRAZOLE MAGNESIUM 40 MG PO CPDR: 40.0000 mg | DELAYED_RELEASE_CAPSULE | Freq: Every day | ORAL | Status: DC | PRN

## 2011-10-22 MED ORDER — HYDROCODONE-ACETAMINOPHEN 5-500 MG PO TABS: 1.0000 | ORAL_TABLET | Freq: Four times a day (QID) | ORAL | Status: AC | PRN

## 2011-10-22 NOTE — Telephone Encounter (Signed)
Pt need new rx nexium 40 mg #90 with 3 refills. Please having med shipped to pt address. Fax to [facility] [phone]. [facility] 10-19-11. Pt was prescribed hydrocodone he is almost out of med, pt would like to know if he can get rx [facility] [address] [phone]. Pt had dental extraction.

## 2011-10-22 NOTE — Telephone Encounter (Signed)
Med called in - vicodin - nexium faxed per pt request

## 2011-10-22 NOTE — Telephone Encounter (Signed)
OK    Vicodin 5/500 #30 one every 6 hours as needed for pain

## 2011-10-30 ENCOUNTER — Telehealth: Payer: Self-pay

## 2011-10-30 NOTE — Telephone Encounter (Signed)
Attempt to call - VM - LMTCB - have recv'd nexium from pt assist program - ready for pick up

## 2011-11-01 ENCOUNTER — Ambulatory Visit: Payer: Self-pay | Admitting: Internal Medicine

## 2011-11-09 ENCOUNTER — Telehealth: Payer: Self-pay | Admitting: Internal Medicine

## 2011-11-09 MED ORDER — HYDROCODONE-HOMATROPINE 5-1.5 MG/5ML PO SYRP: 5.0000 mL | ORAL_SOLUTION | Freq: Four times a day (QID) | ORAL | Status: AC | PRN

## 2011-11-09 NOTE — Telephone Encounter (Signed)
[facility]

## 2011-11-09 NOTE — Telephone Encounter (Signed)
Spoke with pt - productive cough, some head congestion, no fever , but chills on/off - otc for 5 days - little improvement - would like rx cough med  Please advise

## 2011-11-09 NOTE — Telephone Encounter (Signed)
Kim, I found this in Debbie's basket unanswered. Please read & advise. Thank you.

## 2011-11-09 NOTE — Telephone Encounter (Signed)
Patient is calling back to check on status.

## 2011-11-09 NOTE — Telephone Encounter (Signed)
Hydromet 6 ounces 1 teaspoon  every 6 hours as needed for cough and congestion 

## 2011-11-09 NOTE — Telephone Encounter (Signed)
Has a lot of chest congestion. Coughing up some blood this morning, has a sore throat and hoarseness. Requesting an appt to see Dr Todd Carroll or something called in to [facility]. Please advise, asap. He has used over the counter meds. Not helping. Thanks.

## 2011-11-20 ENCOUNTER — Ambulatory Visit (INDEPENDENT_AMBULATORY_CARE_PROVIDER_SITE_OTHER): Payer: PRIVATE HEALTH INSURANCE | Admitting: Internal Medicine

## 2011-11-20 ENCOUNTER — Encounter: Payer: Self-pay | Admitting: Internal Medicine

## 2011-11-20 DIAGNOSIS — J029 Acute pharyngitis, unspecified: Secondary | ICD-10-CM

## 2011-11-20 DIAGNOSIS — K219 Gastro-esophageal reflux disease without esophagitis: Secondary | ICD-10-CM

## 2011-11-20 DIAGNOSIS — J069 Acute upper respiratory infection, unspecified: Secondary | ICD-10-CM

## 2011-11-20 DIAGNOSIS — Z0000 Encounter for general adult medical examination without abnormal findings: Secondary | ICD-10-CM

## 2011-11-20 MED ORDER — ESOMEPRAZOLE MAGNESIUM 40 MG PO CPDR: 40.0000 mg | DELAYED_RELEASE_CAPSULE | Freq: Every day | ORAL | Status: DC | PRN

## 2011-11-20 MED ORDER — FLUTICASONE PROPIONATE 50 MCG/ACT NA SUSP: 2.0000 | Freq: Every day | NASAL | Status: DC

## 2011-11-20 MED ORDER — HYDROCODONE-HOMATROPINE 5-1.5 MG/5ML PO SYRP: 5.0000 mL | ORAL_SOLUTION | Freq: Four times a day (QID) | ORAL | Status: AC | PRN

## 2011-11-20 NOTE — Progress Notes (Signed)
  Subjective:    Patient ID: Todd Carroll, male    DOB: 1976/03/10, 35 y.o.   MRN: [id number]  HPI  35 year old patient who has a history of allergic rhinitis as well as gastroesophageal reflux disease. The past 2 weeks he has had some cold symptoms with head and chest congestion rhinorrhea sore throat. His most significant symptom is nonproductive cough. He has had low-grade fever and malaise.    Review of Systems  Constitutional: Positive for fever and fatigue. Negative for chills and appetite change.  HENT: Positive for congestion, sore throat, rhinorrhea and sinus pressure. Negative for hearing loss, ear pain, trouble swallowing, neck stiffness, dental problem, voice change and tinnitus.   Eyes: Negative for pain, discharge and visual disturbance.  Respiratory: Positive for cough. Negative for chest tightness, wheezing and stridor.   Cardiovascular: Negative for chest pain, palpitations and leg swelling.  Gastrointestinal: Negative for nausea, vomiting, abdominal pain, diarrhea, constipation, blood in stool and abdominal distention.  Genitourinary: Negative for urgency, hematuria, flank pain, discharge, difficulty urinating and genital sores.  Musculoskeletal: Negative for myalgias, back pain, joint swelling, arthralgias and gait problem.  Skin: Negative for rash.  Neurological: Positive for weakness. Negative for dizziness, syncope, speech difficulty, numbness and headaches.  Hematological: Negative for adenopathy. Does not bruise/bleed easily.  Psychiatric/Behavioral: Negative for behavioral problems and dysphoric mood. The patient is not nervous/anxious.        Objective:   Physical Exam  Constitutional: He is oriented to person, place, and time. He appears well-developed.       Temperature 99.1  HENT:  Head: Normocephalic.  Right Ear: External ear normal.  Left Ear: External ear normal.       Oropharynx is erythematous with tonsillar enlargement  Eyes: Conjunctivae and  EOM are normal.  Neck: Normal range of motion.  Cardiovascular: Normal rate and normal heart sounds.   Pulmonary/Chest: Breath sounds normal.  Abdominal: Bowel sounds are normal.  Musculoskeletal: Normal range of motion. He exhibits no edema and no tenderness.  Lymphadenopathy:    He has no cervical adenopathy.  Neurological: He is alert and oriented to person, place, and time.  Psychiatric: He has a normal mood and affect. His behavior is normal.          Assessment & Plan:   URI with viral pharyngitis Allergic rhinitis  We'll treat symptomatically

## 2011-11-20 NOTE — Patient Instructions (Addendum)
Get plenty of rest, Drink lots of  clear liquids, and use Tylenol or ibuprofen for fever and discomfort.    Call or return to clinic prn if these symptoms worsen or fail to improve as anticipated.  

## 2011-11-23 NOTE — Progress Notes (Signed)
Quick Note:  Spoke with pt- informed of results ______ 

## 2011-11-29 ENCOUNTER — Encounter: Payer: Self-pay | Admitting: Internal Medicine

## 2011-11-29 ENCOUNTER — Ambulatory Visit (INDEPENDENT_AMBULATORY_CARE_PROVIDER_SITE_OTHER): Payer: PRIVATE HEALTH INSURANCE | Admitting: Internal Medicine

## 2011-11-29 VITALS — BP 120/80 | Temp 99.2°F | Wt 230.0 lb

## 2011-11-29 DIAGNOSIS — A529 Late syphilis, unspecified: Secondary | ICD-10-CM

## 2011-11-29 MED ORDER — PENICILLIN G BENZATHINE 1200000 UNIT/2ML IM SUSP
2.4000 10*6.[IU] | Freq: Once | INTRAMUSCULAR | Status: AC
  Administered 2011-11-29: 2.4 10*6.[IU] via INTRAMUSCULAR

## 2011-11-29 NOTE — Progress Notes (Signed)
  Subjective:    Patient ID: Todd Carroll, male    DOB: [birth year], 35 y.o.   MRN: [id number]  HPI  35 year old patient who is seen today after attempting to get blood but with positive serology for syphilis.  TP-PA screen was positive as well as confirmatory EIA with a nonreactive RPR.  The most likely interpretation is probable remote inactive infection. The patient has no known prior history of syphilis and no known treatment with penicillin. Options were discussed. It was elected to treat with Bicillin 2.4 million units weekly x3  Review of Systems  Constitutional: Negative for fever, chills, appetite change and fatigue.  HENT: Negative for hearing loss, ear pain, congestion, sore throat, trouble swallowing, neck stiffness, dental problem, voice change and tinnitus.   Eyes: Negative for pain, discharge and visual disturbance.  Respiratory: Negative for cough, chest tightness, wheezing and stridor.   Cardiovascular: Negative for chest pain, palpitations and leg swelling.  Gastrointestinal: Negative for nausea, vomiting, abdominal pain, diarrhea, constipation, blood in stool and abdominal distention.  Genitourinary: Negative for urgency, hematuria, flank pain, discharge, difficulty urinating and genital sores.  Musculoskeletal: Negative for myalgias, back pain, joint swelling, arthralgias and gait problem.  Skin: Negative for rash.  Neurological: Negative for dizziness, syncope, speech difficulty, weakness, numbness and headaches.  Hematological: Negative for adenopathy. Does not bruise/bleed easily.  Psychiatric/Behavioral: Negative for behavioral problems and dysphoric mood. The patient is not nervous/anxious.        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 Will treat with Bicillin 2.4 million units weekly for 3  weeks

## 2011-11-29 NOTE — Patient Instructions (Signed)
Return weekly for 2 additional weeks for additional therapy

## 2011-12-06 ENCOUNTER — Ambulatory Visit: Payer: PRIVATE HEALTH INSURANCE | Admitting: Internal Medicine

## 2011-12-06 DIAGNOSIS — A539 Syphilis, unspecified: Secondary | ICD-10-CM

## 2011-12-06 MED ORDER — PENICILLIN G BENZATHINE 1200000 UNIT/2ML IM SUSP
2.4000 10*6.[IU] | Freq: Once | INTRAMUSCULAR | Status: AC
  Administered 2011-12-06: 2.4 10*6.[IU] via INTRAMUSCULAR

## 2011-12-13 ENCOUNTER — Ambulatory Visit (INDEPENDENT_AMBULATORY_CARE_PROVIDER_SITE_OTHER): Payer: PRIVATE HEALTH INSURANCE | Admitting: Internal Medicine

## 2011-12-13 DIAGNOSIS — A539 Syphilis, unspecified: Secondary | ICD-10-CM

## 2011-12-13 MED ORDER — PENICILLIN G BENZATHINE 1200000 UNIT/2ML IM SUSP
2.4000 10*6.[IU] | Freq: Once | INTRAMUSCULAR | Status: AC
  Administered 2011-12-13: 2.4 10*6.[IU] via INTRAMUSCULAR

## 2011-12-17 ENCOUNTER — Telehealth: Payer: Self-pay | Admitting: Family Medicine

## 2011-12-17 ENCOUNTER — Telehealth: Payer: Self-pay | Admitting: Internal Medicine

## 2011-12-17 NOTE — Telephone Encounter (Signed)
Office Message from Date: 12/16/2011 12:00:00 AM Time of Call: [phone] Faxed To: [facility] Fax Number: [phone] Facility: home Patient: Todd Carroll, Todd Carroll DOB: [birth year] Phone: [phone] Provider: Eleonore Chiquito Message: Please have Dr Charm Rings RN call the patient regarding his visit last Thursday. Regarding Appointment: Appt Date: Appt Time: Unknown Provider: Reason: Details: Outcome: Message Taken by: Forbes Cellar, CSR [facility]  [address] [place], [place] [address]  P: [phone]  F: [phone]

## 2011-12-17 NOTE — Telephone Encounter (Signed)
I am unclear what followup lab tests are required. Please check with public health as to what is required

## 2011-12-17 NOTE — Telephone Encounter (Signed)
Spoke with pt - - health dept called him requesting labs to f/u dx. Following treatment. I told him I would get ok from dr. Amador Cunas but we need to know exactly what labs they need if we are going to order and draw.  Sent to dr. Amador Cunas to inform

## 2011-12-17 NOTE — Telephone Encounter (Signed)
Attempt to call pt - Todd Carroll - Todd Carroll if questions - will need to know what needs to be drawn - unsure of what standards are- will do some checking

## 2011-12-17 NOTE — Telephone Encounter (Signed)
Was seen last week and would like for Kim to return call. He has a question. Pt declined to leave further message. Thanks.

## 2012-02-04 ENCOUNTER — Encounter: Payer: Self-pay | Admitting: Internal Medicine

## 2012-02-04 ENCOUNTER — Ambulatory Visit (INDEPENDENT_AMBULATORY_CARE_PROVIDER_SITE_OTHER): Payer: PRIVATE HEALTH INSURANCE | Admitting: Internal Medicine

## 2012-02-04 DIAGNOSIS — L299 Pruritus, unspecified: Secondary | ICD-10-CM

## 2012-02-04 DIAGNOSIS — K219 Gastro-esophageal reflux disease without esophagitis: Secondary | ICD-10-CM

## 2012-02-04 DIAGNOSIS — J309 Allergic rhinitis, unspecified: Secondary | ICD-10-CM

## 2012-02-04 MED ORDER — ESOMEPRAZOLE MAGNESIUM 40 MG PO CPDR: 40.0000 mg | DELAYED_RELEASE_CAPSULE | Freq: Every day | ORAL | Status: DC | PRN

## 2012-02-04 NOTE — Progress Notes (Signed)
  Subjective:    Patient ID: Todd Carroll, male    DOB: 08-Jul-1976, 36 y.o.   MRN: [id number]  HPI  36 year old patient who presents with a two-day history of generalized pruritus. There has been no rash. He does have a history of allergic rhinitis. He states it a very difficult time sleeping last night due to the itching.  Puritus  is generalized and involved the scalp area as well    Review of Systems  Constitutional: Negative for fever, chills, appetite change and fatigue.  HENT: Negative for hearing loss, ear pain, congestion, sore throat, trouble swallowing, neck stiffness, dental problem, voice change and tinnitus.   Eyes: Negative for pain, discharge and visual disturbance.  Respiratory: Negative for cough, chest tightness, wheezing and stridor.   Cardiovascular: Negative for chest pain, palpitations and leg swelling.  Gastrointestinal: Negative for nausea, vomiting, abdominal pain, diarrhea, constipation, blood in stool and abdominal distention.  Genitourinary: Negative for urgency, hematuria, flank pain, discharge, difficulty urinating and genital sores.  Musculoskeletal: Negative for myalgias, back pain, joint swelling, arthralgias and gait problem.  Skin: Negative for rash (generalized itching without rash).  Neurological: Negative for dizziness, syncope, speech difficulty, weakness, numbness and headaches.  Hematological: Negative for adenopathy. Does not bruise/bleed easily.  Psychiatric/Behavioral: Negative for behavioral problems and dysphoric mood. The patient is not nervous/anxious.        Objective:   Physical Exam  Constitutional: He appears well-developed and well-nourished. No distress.  Skin: No rash noted.       The scalp was examined and was unremarkable          Assessment & Plan:   Generalized pruritus. May in part be related to dry skin. Measures were discussed at length and written directions also dispensed. Return here as needed if unimproved

## 2012-02-04 NOTE — Patient Instructions (Addendum)
Suggest a once a day non-sedating antihistamines such as Allegra or Alavert once daily  Use moisturizing cream as discussed  Pruritus   Pruritis is an itch. There are many different problems that can cause an itch. Dry skin is one of the most common causes of itching. Most cases of itching do not require medical attention.   HOME CARE INSTRUCTIONS   Make sure your skin is moistened on a regular basis. A moisturizer that contains petroleum jelly is best for keeping moisture in your skin. If you develop a rash, you may try the following for relief:    Use corticosteroid cream.   Apply cool compresses to the affected areas.   Bathe with Epsom salts or baking soda in the bathwater.   Soak in colloidal oatmeal baths. These are available at your pharmacy.   Apply baking soda paste to the rash. Stir water into baking soda until it reaches a paste-like consistency.   Use an anti-itch lotion.   Take over-the-counter diphenhydramine medicine by mouth as the instructions direct.   Avoid scratching. Scratching may cause the rash to become infected. If itching is very bad, your caregiver may suggest prescription lotions or creams to lessen your symptoms.   Avoid hot showers, which can make itching worse. A cold shower may help with itching as long as you use a moisturizer after the shower.  SEEK MEDICAL CARE IF: The itching does not go away after several days. Document Released: 08/01/2011 Document Revised: 11/08/2011 Document Reviewed: 08/01/2011 [facility] Patient Information 2012 [place], [place].

## 2012-05-14 ENCOUNTER — Other Ambulatory Visit: Payer: Self-pay | Admitting: Internal Medicine

## 2012-05-14 MED ORDER — ESOMEPRAZOLE MAGNESIUM 40 MG PO CPDR: 40.0000 mg | DELAYED_RELEASE_CAPSULE | Freq: Every day | ORAL | Status: DC | PRN

## 2012-05-14 NOTE — Telephone Encounter (Signed)
Pt needs new rx fax to [facility] for nexium #90 with 3 refills fax #[phone] attn rush team

## 2012-05-14 NOTE — Telephone Encounter (Signed)
Faxed

## 2012-06-02 ENCOUNTER — Encounter: Payer: Self-pay | Admitting: Internal Medicine

## 2012-06-02 ENCOUNTER — Telehealth: Payer: Self-pay | Admitting: Internal Medicine

## 2012-06-02 ENCOUNTER — Ambulatory Visit (INDEPENDENT_AMBULATORY_CARE_PROVIDER_SITE_OTHER): Payer: PRIVATE HEALTH INSURANCE | Admitting: Internal Medicine

## 2012-06-02 VITALS — BP 100/74 | Temp 98.5°F | Wt 234.0 lb

## 2012-06-02 DIAGNOSIS — K219 Gastro-esophageal reflux disease without esophagitis: Secondary | ICD-10-CM

## 2012-06-02 DIAGNOSIS — B369 Superficial mycosis, unspecified: Secondary | ICD-10-CM

## 2012-06-02 MED ORDER — NYSTATIN-TRIAMCINOLONE 100000-0.1 UNIT/GM-% EX OINT: TOPICAL_OINTMENT | Freq: Two times a day (BID) | CUTANEOUS | Status: DC

## 2012-06-02 MED ORDER — TRIAMCINOLONE ACETONIDE 0.1 % EX CREA: TOPICAL_CREAM | Freq: Two times a day (BID) | CUTANEOUS | Status: DC

## 2012-06-02 MED ORDER — ESOMEPRAZOLE MAGNESIUM 40 MG PO CPDR: 40.0000 mg | DELAYED_RELEASE_CAPSULE | Freq: Every day | ORAL | Status: DC | PRN

## 2012-06-02 MED ORDER — NYSTATIN 100000 UNIT/GM EX CREA: TOPICAL_CREAM | Freq: Two times a day (BID) | CUTANEOUS | Status: DC

## 2012-06-02 NOTE — Telephone Encounter (Signed)
Pt called and said that [facility] did not rcv the fax for pts esomeprazole (NEXIUM) 40 MG capsule. Pls fax to [facility] fax # [phone] attn pt ID # [id number]. Pls be sure its 90 day supply with refills.

## 2012-06-02 NOTE — Patient Instructions (Signed)
Heat Rash  Heat rash (miliaria) is a skin irritation caused by heavy sweating during hot, humid weather. It results from blockage of the sweat glands on our body. It can occur at any age. It is most common in young children whose sweat ducts are still developing or are not fully developed. Tight clothing may make the condition worse. Heat rash can look like small blisters (vesicles) that break open easily with bathing or minimal pressure. These blisters are found most commonly on the face, upper trunk of children and the trunk of adults. It can also look like a red cluster of red bumps or pimples (pustules). These usually itch and can also sometimes burn. It is more likely to occur on the neck and upper chest, in the groin, under the breasts, and in elbow creases.  HOME CARE INSTRUCTIONS    The best treatment for heat rash is to provide a cooler, less humid environment where sweating is much decreased.   Keep the affected area dry. Dusting powder (cornstarch powder, baby powder) may be used to increase comfort. Avoid using ointments or creams. They keep the skin warm and moist and may make the condition worse.   Treating heat rash is simple and usually does not require medical assistance.  SEEK MEDICAL CARE IF:    There is any evidence of infection such as fever, redness, swelling.   There is discomfort such as pain.   The skin lesions do no resolve with cooler, dryer environment.  MAKE SURE YOU:    Understand these instructions.   Will watch your condition.   Will get help right away if you are not doing well or get worse.  Document Released: 11/07/2009 Document Revised: 11/08/2011 Document Reviewed: 11/07/2009  [employer] Patient Information 2012 [employer].

## 2012-06-02 NOTE — Telephone Encounter (Signed)
Pt can not afford generic mycolog cost over130.00 requesting samples or another med call into [facility] [phone]

## 2012-06-02 NOTE — Telephone Encounter (Signed)
This was done on 05/14/12  Re faxed per request

## 2012-06-02 NOTE — Telephone Encounter (Signed)
Please advise 

## 2012-06-02 NOTE — Progress Notes (Signed)
  Subjective:    Patient ID: Todd Carroll, male    DOB: 1976-08-08, 36 y.o.   MRN: [id number]  HPI  36 year old patient who is seen today complaining of a rash in the left groin area he has been treated for a fungal dermatitis in the past. He has been walking 4 miles 3 times per week. He has a history of gastro esophageal reflux disease as well as allergic rhinitis that have been quite stable.    Review of Systems  Skin: Positive for rash.       Objective:   Physical Exam  Constitutional: He appears well-developed and well-nourished. No distress.       Weight 234  Skin:       Erythematous changes in the left  inguinal area; a couple areas of folliculitis involving the left scrotal skin          Assessment & Plan:   Heat  Rash/ folliculitis-  local skin care discussed Gastroesophageal reflux disease

## 2012-06-02 NOTE — Telephone Encounter (Signed)
Please call drug store and try to find a less expensive alternative

## 2012-07-18 ENCOUNTER — Telehealth: Payer: Self-pay | Admitting: Internal Medicine

## 2012-07-18 NOTE — Telephone Encounter (Signed)
Patient called stating that he wanted to check status of his [facility] paperwork. Please assist.

## 2012-07-18 NOTE — Telephone Encounter (Signed)
This was completed and faxed ppwk last week

## 2012-08-15 ENCOUNTER — Telehealth: Payer: Self-pay | Admitting: Internal Medicine

## 2012-08-15 NOTE — Telephone Encounter (Signed)
Caller: Deontrey/Patient; Phone: [phone]; Reason for Call: Patient is calling about a letter that is suppose to be fax so he can receive free medications.  Please call him back.  Thanks

## 2012-08-15 NOTE — Telephone Encounter (Signed)
Spoke with pt- re-fax az and me ppwk .

## 2012-10-03 ENCOUNTER — Ambulatory Visit: Payer: PRIVATE HEALTH INSURANCE | Admitting: Internal Medicine

## 2012-10-06 ENCOUNTER — Telehealth: Payer: Self-pay | Admitting: Internal Medicine

## 2012-10-06 ENCOUNTER — Encounter: Payer: Self-pay | Admitting: Internal Medicine

## 2012-10-06 ENCOUNTER — Ambulatory Visit (INDEPENDENT_AMBULATORY_CARE_PROVIDER_SITE_OTHER): Payer: PRIVATE HEALTH INSURANCE | Admitting: Internal Medicine

## 2012-10-06 VITALS — BP 120/78 | Temp 98.5°F | Wt 238.0 lb

## 2012-10-06 DIAGNOSIS — N2889 Other specified disorders of kidney and ureter: Secondary | ICD-10-CM

## 2012-10-06 DIAGNOSIS — R300 Dysuria: Secondary | ICD-10-CM

## 2012-10-06 LAB — POCT URINALYSIS DIPSTICK
Bilirubin, UA: NEGATIVE
Ketones, UA: NEGATIVE
Leukocytes, UA: NEGATIVE
Nitrite, UA: NEGATIVE
Protein, UA: NEGATIVE

## 2012-10-06 MED ORDER — CEFTRIAXONE SODIUM 250 MG IJ SOLR: 250.0000 mg | Freq: Once | INTRAMUSCULAR | Status: DC

## 2012-10-06 MED ORDER — AZITHROMYCIN 250 MG PO TABS: ORAL_TABLET | ORAL | Status: DC

## 2012-10-06 NOTE — Addendum Note (Signed)
Addended by: Duard Brady I on: 10/06/2012 01:46 PM   Modules accepted: Orders

## 2012-10-06 NOTE — Telephone Encounter (Signed)
Was sent in error- this was given x1 in office today

## 2012-10-06 NOTE — Progress Notes (Signed)
  Subjective:    Patient ID: Todd Carroll, male    DOB: Oct 23, 1976, 36 y.o.   MRN: [id number]  HPI 36 year old patient who has a prior history of STD who presents with a two-day history of urethral discharge and some dysuria.  Symptoms are minor. No fever. No history of genital lesions. He does state that he had protected sex about 2 weeks ago.    Review of Systems  Genitourinary: Positive for dysuria and discharge.       Objective:   Physical Exam  Genitourinary: Penis normal. No penile tenderness.          Assessment & Plan:   History of urethral discharge and STD. No pyuria noted on urinalysis and no obvious urethral discharge at this time. Options discussed have elected to treat with Rocephin 250 IM and 1 g of azithromycin

## 2012-10-06 NOTE — Patient Instructions (Addendum)
Take your antibiotic as prescribed until ALL of it is gone, but stop if you develop a rash, swelling, or any side effects of the medication.  Contact our office as soon as possible if  there are side effects of the medication.  Call or return to clinic prn if these symptoms worsen or fail to improve as anticipated.  

## 2012-10-06 NOTE — Telephone Encounter (Signed)
[facility] called and said that they can not fill script for cefTRIAXone (ROCEPHIN) injection 250 mg.

## 2012-10-09 ENCOUNTER — Telehealth: Payer: Self-pay | Admitting: Internal Medicine

## 2012-10-09 MED ORDER — PHENAZOPYRIDINE HCL 200 MG PO TABS: 200.0000 mg | ORAL_TABLET | Freq: Four times a day (QID) | ORAL | Status: DC | PRN

## 2012-10-09 NOTE — Telephone Encounter (Signed)
Pt called and said that he is still having the same symptoms he was having when he came in for the ov. Pt req Kim to call back.

## 2012-10-09 NOTE — Telephone Encounter (Signed)
Please advise 

## 2012-10-09 NOTE — Telephone Encounter (Signed)
Pt aware - med sent to [facility]

## 2012-10-09 NOTE — Telephone Encounter (Signed)
Pyridium 200 mg  #30  one 4 times a day

## 2013-02-05 ENCOUNTER — Ambulatory Visit (INDEPENDENT_AMBULATORY_CARE_PROVIDER_SITE_OTHER): Payer: PRIVATE HEALTH INSURANCE | Admitting: Internal Medicine

## 2013-02-05 ENCOUNTER — Encounter: Payer: Self-pay | Admitting: Internal Medicine

## 2013-02-05 VITALS — BP 110/70 | Temp 101.7°F | Wt 230.0 lb

## 2013-02-05 DIAGNOSIS — R300 Dysuria: Secondary | ICD-10-CM

## 2013-02-05 DIAGNOSIS — J029 Acute pharyngitis, unspecified: Secondary | ICD-10-CM

## 2013-02-05 LAB — POCT URINALYSIS DIPSTICK
Glucose, UA: NEGATIVE
Protein, UA: 30
Spec Grav, UA: 1.02
Urobilinogen, UA: 0.2

## 2013-02-05 LAB — POCT RAPID STREP A (OFFICE): Rapid Strep A Screen: NEGATIVE

## 2013-02-05 MED ORDER — PENICILLIN V POTASSIUM 500 MG PO TABS: 500.0000 mg | ORAL_TABLET | Freq: Four times a day (QID) | ORAL | Status: DC

## 2013-02-05 MED ORDER — HYDROCODONE-ACETAMINOPHEN 5-300 MG PO TABS: ORAL_TABLET | ORAL | Status: DC

## 2013-02-05 NOTE — Patient Instructions (Signed)
Take your antibiotic as prescribed until ALL of it is gone, but stop if you develop a rash, swelling, or any side effects of the medication.  Contact our office as soon as possible if  there are side effects of the medication.  Call or return to clinic prn if these symptoms worsen or fail to improve as anticipated.  

## 2013-02-05 NOTE — Progress Notes (Signed)
Subjective:    Patient ID: Todd Carroll, male    DOB: 25-Jul-1976, 37 y.o.   MRN: [id number]  HPI  37 year old patient who presents with a one-day history of sore throat fever. He did have a strep exposure 2 weeks ago and took a gun son to the pediatrician where he was treated for strep pharyngitis. He is scheduled to see his dentist in due 2 a toothache. He has generalized weakness and myalgias. No chills. His chief complaint is sore throat and toothache. A rapid strep negative urinalysis revealed no pyuria.  Past Medical History  Diagnosis Date  . ALLERGIC RHINITIS 08/27/2007  . GERD 05/04/2008    History   Social History  . Marital Status: Single    Spouse Name: N/A    Number of Children: N/A  . Years of Education: N/A   Occupational History  . Not on file.   Social History Main Topics  . Smoking status: Never Smoker   . Smokeless tobacco: Never Used  . Alcohol Use: No  . Drug Use: No  . Sexually Active: Not on file   Other Topics Concern  . Not on file   Social History Narrative  . No narrative on file    Past Surgical History  Procedure Laterality Date  . Appendectomy      No family history on file.  No Known Allergies  Current Outpatient Prescriptions on File Prior to Visit  Medication Sig Dispense Refill  . esomeprazole (NEXIUM) 40 MG capsule Take 1 capsule (40 mg total) by mouth daily as needed.  90 capsule  3  . fluticasone (FLONASE) 50 MCG/ACT nasal spray Place 2 sprays into the nose daily.  16 g  6  . nystatin cream (MYCOSTATIN) Apply topically 2 (two) times daily.  30 g  1  . nystatin-triamcinolone ointment (MYCOLOG) Apply topically 2 (two) times daily.  30 g  0  . triamcinolone cream (KENALOG) 0.1 % Apply topically 2 (two) times daily.  30 g  1   No current facility-administered medications on file prior to visit.    BP 110/70  Temp(Src) 101.7 F (38.7 C) (Oral)  Wt 230 lb (104.327 kg)  BMI 31.63 kg/m2       Review of Systems   Constitutional: Positive for fever and fatigue. Negative for chills and appetite change.  HENT: Positive for sore throat. Negative for hearing loss, ear pain, congestion, trouble swallowing, neck stiffness, dental problem, voice change and tinnitus.   Eyes: Negative for pain, discharge and visual disturbance.  Respiratory: Negative for cough, chest tightness, wheezing and stridor.   Cardiovascular: Negative for chest pain, palpitations and leg swelling.  Gastrointestinal: Negative for nausea, vomiting, abdominal pain, diarrhea, constipation, blood in stool and abdominal distention.  Genitourinary: Positive for dysuria. Negative for urgency, hematuria, flank pain, discharge, difficulty urinating and genital sores.  Musculoskeletal: Positive for myalgias. Negative for back pain, joint swelling, arthralgias and gait problem.  Skin: Negative for rash.  Neurological: Negative for dizziness, syncope, speech difficulty, weakness, numbness and headaches.  Hematological: Negative for adenopathy. Does not bruise/bleed easily.  Psychiatric/Behavioral: Negative for behavioral problems and dysphoric mood. The patient is not nervous/anxious.        Objective:   Physical Exam  Constitutional: He is oriented to person, place, and time. He appears well-developed.  Temperature 101.7  HENT:  Head: Normocephalic.  Right Ear: External ear normal.  Left Ear: External ear normal.  Pharyngitis with marked tonsillar large left greater than the right  Eyes:  Conjunctivae and EOM are normal.  Neck: Normal range of motion.  No significant cervical adenopathy  Cardiovascular: Normal rate and normal heart sounds.   Pulmonary/Chest: Breath sounds normal.  Abdominal: Bowel sounds are normal.  Musculoskeletal: Normal range of motion. He exhibits no edema and no tenderness.  Neurological: He is alert and oriented to person, place, and time.  Psychiatric: He has a normal mood and affect. His behavior is normal.           Assessment & Plan:   Pharyngitis Toothache Dysuria. Urinalysis normal may simply be related to his febrile state  We'll treat symptomatically and also with penicillin for the toothache as well as the strep exposure

## 2013-02-09 ENCOUNTER — Encounter: Payer: Self-pay | Admitting: Internal Medicine

## 2013-02-09 ENCOUNTER — Telehealth: Payer: Self-pay | Admitting: Internal Medicine

## 2013-02-09 ENCOUNTER — Ambulatory Visit (INDEPENDENT_AMBULATORY_CARE_PROVIDER_SITE_OTHER): Payer: PRIVATE HEALTH INSURANCE | Admitting: Internal Medicine

## 2013-02-09 VITALS — BP 120/90 | HR 93 | Temp 98.7°F | Resp 20 | Wt 232.0 lb

## 2013-02-09 DIAGNOSIS — M545 Low back pain: Secondary | ICD-10-CM

## 2013-02-09 DIAGNOSIS — J029 Acute pharyngitis, unspecified: Secondary | ICD-10-CM

## 2013-02-09 NOTE — Telephone Encounter (Signed)
Patient Information:  Caller Name: Nyaire  Phone: [phone]  Patient: Todd Carroll, Todd Carroll  Gender: Male  DOB: 04/10/1976  Age: 37 Years  PCP: Eleonore Chiquito [facility])  Office Follow Up:  Does the office need to follow up with this patient?: No  Instructions For The Office: N/A   Symptoms  Reason For Call & Symptoms: Back pain (8-9 of 10) when lying down started after he was seen for Sore Throat (8 of 10 - constant) and Abscessed Tooth (10 of 10).    No improvement in symptoms with Rx Vicodin. Back pain across the waist area.  Reviewed Health History In EMR: Yes  Reviewed Medications In EMR: Yes  Reviewed Allergies In EMR: Yes  Reviewed Surgeries / Procedures: Yes  Date of Onset of Symptoms: 02/06/2013  Treatments Tried: Vicodin, Walking  Treatments Tried Worked: No  Guideline(s) Used:  Back Pain  Disposition Per Guideline:   Go to Office Now  Reason For Disposition Reached:   Severe back pain  Advice Given:  Reassurance:  Twisting or heavy lifting can cause back pain.  Cold or Heat:  Cold Pack: For pain or swelling, use a cold pack or ice wrapped in a wet cloth. Put it on the sore area for 20 minutes. Repeat 4 times on the first day, then as needed.  Heat Pack: If pain lasts over 2 days, apply heat to the sore area. Use a heat pack, heating pad, or warm wet washcloth. Do this for 10 minutes, then as needed. For widespread stiffness, take a hot bath or hot shower instead. Move the sore area under the warm water.  Sleep:  Sleep on your side with a pillow between your knees. If you sleep on your back, put a pillow under your knees.  Avoid sleeping on your stomach.  Your mattress should be firm. Avoid waterbeds.  Activity  Keep doing your day-to-day activities if it is not too painful. Staying active is better than resting.  Call Back If:  Numbness or weakness occur  Bowel/bladder problems occur  You become worse.  RN Overrode Recommendation:  Make  Appointment  Appointment with PCP at 13:00, next available and within office guidelines  Appointment Scheduled:  02/09/2013 13:00:00 Appointment Scheduled Provider:  Eleonore Chiquito [facility])

## 2013-02-09 NOTE — Progress Notes (Signed)
Subjective:    Patient ID: Todd Carroll, male    DOB: Nov 18, 1976, 37 y.o.   MRN: [id number]  HPI  37 year old patient who was seen 4 days ago. At that time he was placed on penicillin do to a toothache and suspected  dental infection. He also had a pharyngitis and weak strep exposure history. Rapid strep screen was negative. Today he complains of mild low back pain. He is on analgesics as well. He did donate plasma today and does have a dental evaluation in 2 days. His fever has resolved he still feels a bit weak and diaphoretic.  Past Medical History  Diagnosis Date  . ALLERGIC RHINITIS 08/27/2007  . GERD 05/04/2008    History   Social History  . Marital Status: Single    Spouse Name: N/A    Number of Children: N/A  . Years of Education: N/A   Occupational History  . Not on file.   Social History Main Topics  . Smoking status: Never Smoker   . Smokeless tobacco: Never Used  . Alcohol Use: No  . Drug Use: No  . Sexually Active: Not on file   Other Topics Concern  . Not on file   Social History Narrative  . No narrative on file    Past Surgical History  Procedure Laterality Date  . Appendectomy      No family history on file.  No Known Allergies  Current Outpatient Prescriptions on File Prior to Visit  Medication Sig Dispense Refill  . esomeprazole (NEXIUM) 40 MG capsule Take 1 capsule (40 mg total) by mouth daily as needed.  90 capsule  3  . fluticasone (FLONASE) 50 MCG/ACT nasal spray Place 2 sprays into the nose daily.  16 g  6  . Hydrocodone-Acetaminophen 5-300 MG TABS 1 tablet every 6 hours as needed for pain  30 each  0  . penicillin v potassium (VEETID) 500 MG tablet Take 1 tablet (500 mg total) by mouth 4 (four) times daily.  30 tablet  0   No current facility-administered medications on file prior to visit.    BP 120/90  Pulse 93  Temp(Src) 98.7 F (37.1 C) (Oral)  Resp 20  Wt 232 lb (105.235 kg)  BMI 31.91 kg/m2  SpO2 97%      Review  of Systems  Constitutional: Positive for diaphoresis and fatigue. Negative for fever, chills and appetite change.  HENT: Positive for sore throat. Negative for hearing loss, ear pain, congestion, trouble swallowing, neck stiffness, dental problem, voice change and tinnitus.   Eyes: Negative for pain, discharge and visual disturbance.  Respiratory: Negative for cough, chest tightness, wheezing and stridor.   Cardiovascular: Negative for chest pain, palpitations and leg swelling.  Gastrointestinal: Negative for nausea, vomiting, abdominal pain, diarrhea, constipation, blood in stool and abdominal distention.  Genitourinary: Negative for urgency, hematuria, flank pain, discharge, difficulty urinating and genital sores.  Musculoskeletal: Positive for back pain. Negative for myalgias, joint swelling, arthralgias and gait problem.  Skin: Negative for rash.  Neurological: Negative for dizziness, syncope, speech difficulty, weakness, numbness and headaches.  Hematological: Negative for adenopathy. Does not bruise/bleed easily.  Psychiatric/Behavioral: Negative for behavioral problems and dysphoric mood. The patient is not nervous/anxious.        Objective:   Physical Exam  Constitutional: He is oriented to person, place, and time. He appears well-developed.  HENT:  Head: Normocephalic.  Right Ear: External ear normal.  Left Ear: External ear normal.  Prominent tonsil enlargement but less  erythematous  Eyes: Conjunctivae and EOM are normal.  Neck: Normal range of motion.  Cardiovascular: Normal rate and normal heart sounds.   Pulmonary/Chest: Breath sounds normal.  Abdominal: Bowel sounds are normal.  Musculoskeletal: Normal range of motion. He exhibits no edema and no tenderness.  Neurological: He is alert and oriented to person, place, and time.  Psychiatric: He has a normal mood and affect. His behavior is normal.          Assessment & Plan:   Non specific LBP/pharyngitis Continue  symptomatic treatment Dental f/u

## 2013-02-09 NOTE — Patient Instructions (Signed)
Most patients with low back pain will improve with time over the next two to 6 weeks.  Keep active but avoid any activities that cause pain.  Apply moist heat to the low back area several times daily.  Call or return to clinic prn if these symptoms worsen or fail to improve as anticipated.  

## 2013-02-13 ENCOUNTER — Telehealth: Payer: Self-pay | Admitting: Internal Medicine

## 2013-02-13 MED ORDER — CYCLOBENZAPRINE HCL 10 MG PO TABS: 10.0000 mg | ORAL_TABLET | Freq: Three times a day (TID) | ORAL | Status: DC | PRN

## 2013-02-13 NOTE — Telephone Encounter (Signed)
Patient calling back wanting medication. Declines Triage. He states that someone from the office was going to call him back about "Muscle Relaxers". Requesting follow up regarding medication. Reviewed epic and note that Dr. Kirtland Bouchard. Has responded about medication.  PLEASE REVIEW. Patient requesting medication before office closes. [phone].  Requested that patient give the office time to respond to request. Understanding expressed. MEDICATION REQUEST

## 2013-02-13 NOTE — Telephone Encounter (Signed)
Left message on voicemail. Rx was sent to [facility].

## 2013-02-13 NOTE — Telephone Encounter (Signed)
Generic Flexeril 10 mg #30 one 3 times a day as needed for muscle spasm

## 2013-02-13 NOTE — Telephone Encounter (Signed)
Patient Information:  Caller Name: Seichi  Phone: [phone]  Patient: Todd Carroll, Todd Carroll  Gender: Male  DOB: 09-07-1976  Age: 37 Years  PCP: Eleonore Chiquito [facility])  Office Follow Up:  Does the office need to follow up with this patient?: Yes  Instructions For The Office: OFFICE PLEASE CALL PT BACK AT [phone] TO LET HIM KNOW IF MD WILL CALL IN MUSCLE RELAXER FOR HIS BACK PAIN.  WAS SEEN IN OFFICE ON 02/05/13 AND AGAIN ON 02/09/13.  WOULD LIKE TO AVOID ANOTHER APPT.  PHARM[facility]Y IS [facility] [address].  PHONE NUMBER IN CHART.  Thanks.  RN Note:  Pt does not wish another appt.  Would like muscle relaxer called in. Advised pt to continue taking PCN and pain medication as directed by MD.    Symptoms  Reason For Call & Symptoms: Was in office on 02/05/13 for sore throat and was prescribed PCN.  Started having back pain the day after taking the PCN.  Was seen in office again on 02/09/13 because of the back pain MD did prescribed Vicodin for the back pain but it does not seem to help.  Pt would like to have a muscle relaxer called in.  He has been drinking lots of water.  Seems like not urinating as frequently as he would normally go.  But is urinating several times at least every 8 hours.  No pain with urination and no blood in urine. Pain is right above his tail bone and goes across.  Reviewed Health History In EMR: Yes  Reviewed Medications In EMR: Yes  Reviewed Allergies In EMR: Yes  Reviewed Surgeries / Procedures: Yes  Date of Onset of Symptoms: 02/10/2013  Treatments Tried: pain medication  Treatments Tried Worked: No  Guideline(s) Used:  Back Pain  Disposition Per Guideline:   Home Care  Reason For Disposition Reached:   Back pain  Advice Given:  N/A  Patient Will Follow Care Advice:  YES

## 2013-02-17 ENCOUNTER — Ambulatory Visit: Payer: PRIVATE HEALTH INSURANCE | Admitting: Internal Medicine

## 2013-02-25 ENCOUNTER — Telehealth: Payer: Self-pay | Admitting: Internal Medicine

## 2013-02-25 DIAGNOSIS — Z209 Contact with and (suspected) exposure to unspecified communicable disease: Secondary | ICD-10-CM

## 2013-02-25 NOTE — Telephone Encounter (Signed)
Caller: Berman/Patient; Phone: [phone]; Reason for Call: Wanting to be tested for HIV.  He is not having any symptoms.  Please ask Dr. Kirtland Bouchard if this can be ordered and call him back.

## 2013-02-25 NOTE — Telephone Encounter (Signed)
Pls advise.  

## 2013-02-26 NOTE — Telephone Encounter (Signed)
Yes, OK to check

## 2013-02-27 ENCOUNTER — Telehealth: Payer: Self-pay | Admitting: Internal Medicine

## 2013-02-27 ENCOUNTER — Other Ambulatory Visit (INDEPENDENT_AMBULATORY_CARE_PROVIDER_SITE_OTHER): Payer: PRIVATE HEALTH INSURANCE

## 2013-02-27 DIAGNOSIS — Z209 Contact with and (suspected) exposure to unspecified communicable disease: Secondary | ICD-10-CM

## 2013-02-27 DIAGNOSIS — B20 Human immunodeficiency virus [HIV] disease: Secondary | ICD-10-CM

## 2013-02-27 HISTORY — DX: Human immunodeficiency virus (HIV) disease: B20

## 2013-02-27 NOTE — Telephone Encounter (Signed)
[address] [place], [place] [address] p. [phone] f. [phone] To: [facility] (After Hours Triage) Fax: [phone] From: [facility] Date/ Time: 02/26/2013 8:33 PM Taken By: Patria Mane, CSR Caller: [name] Facility: home Patient: Todd, Carroll DOB: 1976/08/07 Phone: [phone] Reason for Call: Pt requested a call back today but no one called him back. He needs a order for lab work that Dr Kirtland Bouchard was wanting him to have. Please call this pt tomorrow.

## 2013-02-27 NOTE — Telephone Encounter (Signed)
Spoke to pt told him order is done just need to make an appt for lab. Pt verbalized understanding.

## 2013-02-28 LAB — HIV ANTIBODY (ROUTINE TESTING W REFLEX): HIV: REACTIVE

## 2013-03-03 ENCOUNTER — Telehealth: Payer: Self-pay | Admitting: *Deleted

## 2013-03-03 NOTE — Telephone Encounter (Signed)
See lab result

## 2013-03-06 ENCOUNTER — Other Ambulatory Visit: Payer: Self-pay | Admitting: Internal Medicine

## 2013-03-06 DIAGNOSIS — Z21 Asymptomatic human immunodeficiency virus [HIV] infection status: Secondary | ICD-10-CM

## 2013-03-09 ENCOUNTER — Telehealth: Payer: Self-pay

## 2013-03-09 NOTE — Telephone Encounter (Signed)
Call to Patient Pt was referred by Dr Amador Cunas for newly diagnosed HIV.  He is non insured and will need financial assistance.  Information given for appointment date and time along with address.  Laurell Josephs, RN

## 2013-03-17 ENCOUNTER — Telehealth: Payer: Self-pay | Admitting: Internal Medicine

## 2013-03-17 NOTE — Telephone Encounter (Signed)
Patient Information:  Caller Name: [place]  Phone: [phone]  Patient: Todd Carroll, Todd Carroll  Gender: Male  DOB: 1976/11/30  Age: 37 Years  PCP: Eleonore Chiquito [facility])  Office Follow Up:  Does the office need to follow up with this patient?: Yes  Instructions For The Office: He would like to know from Dr. Kirtland Bouchard what he should do about enlarged tonsils.  He has already spoke to a nurse and did not receive call back from office  RN Note:  Patient declined triage. He only wants Dr. Kirtland Bouchard opinion should he come in for evaluation. Advised caller that I can schedule him appt. Declined and declined triage . He states he already spoke with a nurse this morning . he is wanting follow up from office.  Symptoms  Reason For Call & Symptoms: Patient was seen by an oral surgeon last week 03/10/13 and he noted that patient had enlarged tonsils and should be evaluated by the physician.   He has no sore throat, no fever, no issues with swallowing or breathing.  Reviewed Health History In EMR: Yes  Reviewed Medications In EMR: Yes  Reviewed Allergies In EMR: Yes  Reviewed Surgeries / Procedures: Yes  Date of Onset of Symptoms: 03/10/2013  Guideline(s) Used:  Sore Throat  No Protocol Available - Information Only  Disposition Per Guideline:   Discuss with PCP and Callback by Nurse Today  Reason For Disposition Reached:   Nursing judgment  Advice Given:  Call Back If:  New symptoms develop  You become worse.  Patient Refused Recommendation:  Patient Refused Care Advice  He would like to know from Dr. Kirtland Bouchard what he should do about enlarged tonsils.  He has already spoke to a nurse and did not receive call back from office

## 2013-03-17 NOTE — Telephone Encounter (Signed)
[place], [place] [address] p. [phone] f. [phone] To: [facility] (After Hours Triage) Fax: [phone] From: [facility] Date/ Time: 03/16/2013 11:34 PM Taken By: Candida Peeling, RN Caller: [name] Facility: home Patient: Todd Carroll, Todd Carroll DOB: July 20, 1976 Phone: [phone] Reason for Call: Please call pt, his oral surgeon told him his tonsils were enlarged. Pt does not know if he needs to make an appt. Regarding Appointment: Appt Date: Appt Time: Unknown Provider: Reason: Details: Outcome:

## 2013-03-17 NOTE — Telephone Encounter (Signed)
Please notify patient that we'll reassess his tonsils at his next routine visit. He does not need office followup at this time unless his tonsils are causing pain or difficulty

## 2013-03-18 NOTE — Telephone Encounter (Signed)
Spoke to patient told him that we'll reassess his tonsils at his next routine visit. He does not need office followup at this time unless his tonsils are causing pain or difficulty per Dr. Amador Cunas. Pt verbalized understanding and stated that is fine.

## 2013-03-19 ENCOUNTER — Other Ambulatory Visit (HOSPITAL_COMMUNITY)
Admission: RE | Admit: 2013-03-19 | Discharge: 2013-03-19 | Disposition: A | Discharge status: Home / Self Care | Payer: Self-pay | Source: Ambulatory Visit | Attending: Infectious Diseases | Admitting: Infectious Diseases

## 2013-03-19 ENCOUNTER — Other Ambulatory Visit: Payer: Self-pay | Admitting: Internal Medicine

## 2013-03-19 ENCOUNTER — Ambulatory Visit (INDEPENDENT_AMBULATORY_CARE_PROVIDER_SITE_OTHER): Payer: PRIVATE HEALTH INSURANCE

## 2013-03-19 ENCOUNTER — Ambulatory Visit: Payer: PRIVATE HEALTH INSURANCE

## 2013-03-19 ENCOUNTER — Other Ambulatory Visit: Payer: Self-pay | Admitting: Infectious Diseases

## 2013-03-19 DIAGNOSIS — Z113 Encounter for screening for infections with a predominantly sexual mode of transmission: Secondary | ICD-10-CM | POA: Insufficient documentation

## 2013-03-19 DIAGNOSIS — Z79899 Other long term (current) drug therapy: Secondary | ICD-10-CM

## 2013-03-19 DIAGNOSIS — Z23 Encounter for immunization: Secondary | ICD-10-CM

## 2013-03-19 DIAGNOSIS — B20 Human immunodeficiency virus [HIV] disease: Secondary | ICD-10-CM

## 2013-03-19 LAB — HEPATITIS B SURFACE ANTIGEN: Hepatitis B Surface Ag: NEGATIVE

## 2013-03-19 LAB — CBC WITH DIFFERENTIAL/PLATELET
Basophils Absolute: 0 10*3/uL (ref 0.0–0.1)
Basophils Relative: 1 % (ref 0–1)
Eosinophils Absolute: 0.1 10*3/uL (ref 0.0–0.7)
Eosinophils Relative: 1 % (ref 0–5)
HCT: 40.1 % (ref 39.0–52.0)
Lymphocytes Relative: 39 % (ref 12–46)
MCH: 29.3 pg (ref 26.0–34.0)
MCHC: 34.7 g/dL (ref 30.0–36.0)
MCV: 84.4 fL (ref 78.0–100.0)
Monocytes Absolute: 0.8 10*3/uL (ref 0.1–1.0)
Platelets: 337 10*3/uL (ref 150–400)
RDW: 14.2 % (ref 11.5–15.5)

## 2013-03-19 LAB — COMPLETE METABOLIC PANEL WITH GFR
AST: 21 U/L (ref 0–37)
BUN: 16 mg/dL (ref 6–23)
Calcium: 9.5 mg/dL (ref 8.4–10.5)
Chloride: 102 mEq/L (ref 96–112)
Creat: 1.23 mg/dL (ref 0.50–1.35)
Total Bilirubin: 0.3 mg/dL (ref 0.3–1.2)

## 2013-03-19 LAB — RPR

## 2013-03-19 LAB — LIPID PANEL
HDL: 42 mg/dL (ref >39)
LDL Cholesterol: 86 mg/dL (ref 0–99)
Total CHOL/HDL Ratio: 4.2 Ratio
VLDL: 48 mg/dL — ABNORMAL HIGH (ref 0–40)

## 2013-03-19 LAB — HEPATITIS C ANTIBODY: HCV Ab: NEGATIVE

## 2013-03-19 NOTE — Progress Notes (Signed)
Patient states he is asymptomatic.  He has sex with males and females.  He will meet to financial counselor for assistance with Juanell Fairly and [facility].

## 2013-03-20 LAB — URINALYSIS
Bilirubin Urine: NEGATIVE
Glucose, UA: NEGATIVE mg/dL
Hgb urine dipstick: NEGATIVE
Ketones, ur: NEGATIVE mg/dL
Leukocytes, UA: NEGATIVE
Protein, ur: NEGATIVE mg/dL
pH: 6 (ref 5.0–8.0)

## 2013-03-20 LAB — HIV-1 RNA ULTRAQUANT REFLEX TO GENTYP+
HIV 1 RNA Quant: 38421 copies/mL — ABNORMAL HIGH (ref <20)
HIV-1 RNA Quant, Log: 4.58 {Log} — ABNORMAL HIGH (ref <1.30)

## 2013-03-20 LAB — HEPATITIS B SURFACE ANTIBODY,QUALITATIVE: Hep B S Ab: NONREACTIVE

## 2013-03-30 LAB — HIV-1 GENOTYPR PLUS

## 2013-04-02 ENCOUNTER — Ambulatory Visit (INDEPENDENT_AMBULATORY_CARE_PROVIDER_SITE_OTHER): Payer: Self-pay | Admitting: Internal Medicine

## 2013-04-02 ENCOUNTER — Encounter: Payer: Self-pay | Admitting: Internal Medicine

## 2013-04-02 VITALS — BP 137/88 | HR 80 | Temp 98.1°F | Ht 73.0 in | Wt 225.0 lb

## 2013-04-02 DIAGNOSIS — Z23 Encounter for immunization: Secondary | ICD-10-CM

## 2013-04-02 DIAGNOSIS — B20 Human immunodeficiency virus [HIV] disease: Secondary | ICD-10-CM

## 2013-04-02 MED ORDER — ELVITEG-COBIC-EMTRICIT-TENOFDF 150-150-200-300 MG PO TABS: 1.0000 | ORAL_TABLET | Freq: Every day | ORAL | Status: DC

## 2013-04-02 NOTE — Progress Notes (Signed)
[facility] CLINIC NOTE  RFV: establshing care after newly diagnosed Subjective:    Patient ID: Todd Carroll, male    DOB: 12-31-1975, 37 y.o.   MRN: [id number]  HPI  Todd Carroll is a 37 yo M with newly diagnosed hiv, CD 4 count of 600(22%)/VL 38,421. HIV genotype shows minor mutations (L10V,M36I), non immune to hep A or B. HIV RF unprotected sex with men and women. He reports doing well except has occ dental pain left lower jaw molars. He has seen dentist who estimated $1K of work.   - unclear hx of STI, last tested for HIV in dec2012  Current Outpatient Prescriptions on File Prior to Visit  Medication Sig Dispense Refill  . esomeprazole (NEXIUM) 40 MG capsule Take 1 capsule (40 mg total) by mouth daily as needed.  90 capsule  3  . cyclobenzaprine (FLEXERIL) 10 MG tablet Take 1 tablet (10 mg total) by mouth 3 (three) times daily as needed for muscle spasms.  30 tablet  0  . fluticasone (FLONASE) 50 MCG/ACT nasal spray Place 2 sprays into the nose daily.  16 g  6  . Hydrocodone-Acetaminophen 5-300 MG TABS 1 tablet every 6 hours as needed for pain  30 each  0  . penicillin v potassium (VEETID) 500 MG tablet Take 1 tablet (500 mg total) by mouth 4 (four) times daily.  30 tablet  0   No current facility-administered medications on file prior to visit.     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 No illicit drugs   Review of Systems Review of Systems  Constitutional: Negative for fever, chills, diaphoresis, activity change, appetite change, fatigue and unexpected weight change.  HENT: Negative for congestion, sore throat, rhinorrhea, sneezing, trouble swallowing and sinus pressure.  Eyes: Negative for photophobia and visual disturbance.  Respiratory: Negative for cough, chest tightness, shortness of breath, wheezing and stridor.  Cardiovascular: Negative for chest  pain, palpitations and leg swelling.  Gastrointestinal: Negative for nausea, vomiting, abdominal pain, diarrhea, constipation, blood in stool, abdominal distention and anal bleeding.  Genitourinary: Negative for dysuria, hematuria, flank pain and difficulty urinating.  Musculoskeletal: Negative for myalgias, back pain, joint swelling, arthralgias and gait problem.  Skin: Negative for color change, pallor, rash and wound.  Neurological: Negative for dizziness, tremors, weakness and light-headedness.  Hematological: Negative for adenopathy. Does not bruise/bleed easily.  Psychiatric/Behavioral: Negative for behavioral problems, confusion, sleep disturbance, dysphoric mood, decreased concentration and agitation.       Objective:   Physical Exam BP 137/88  Pulse 80  Temp(Src) 98.1 F (36.7 C) (Oral)  Ht 6\' 1"  (1.854 m)  Wt 225 lb (102.059 kg)  BMI 29.69 kg/m2 Physical Exam  Constitutional: He is oriented to person, place, and time. He appears well-developed and well-nourished. No distress.  HENT:  Mouth/Throat: Oropharynx is clear and moist. No oropharyngeal exudate.  Cardiovascular: Normal rate, regular rhythm and normal heart sounds. Exam reveals no gallop and no friction rub.  No murmur heard.  Pulmonary/Chest: Effort normal and breath sounds normal. No respiratory distress. He has no wheezes.  Abdominal: Soft. Bowel sounds are normal. He exhibits no distension. There is no tenderness.  Lymphadenopathy:  He has no cervical adenopathy.  Neurological: He is alert and oriented to person, place, and time.  Skin: Skin is warm and dry. No rash noted. No erythema.  Psychiatric: He has a normal  mood and affect. His behavior is normal.       Assessment & Plan:  HIV = will plan on starting on stribild once adap in place  Dental caries = get into dental clinic with application process  Health maintenance = will give hep a and hep b today,rtc in 1 month for hep b #2  Follow up in 6-7 wk  after today, roughly at 4 wks of stribild initiation

## 2013-04-15 ENCOUNTER — Other Ambulatory Visit: Payer: Self-pay | Admitting: *Deleted

## 2013-04-15 DIAGNOSIS — B20 Human immunodeficiency virus [HIV] disease: Secondary | ICD-10-CM

## 2013-04-15 MED ORDER — ELVITEG-COBIC-EMTRICIT-TENOFDF 150-150-200-300 MG PO TABS: 1.0000 | ORAL_TABLET | Freq: Every day | ORAL | Status: DC

## 2013-04-23 ENCOUNTER — Telehealth: Payer: Self-pay | Admitting: Internal Medicine

## 2013-04-23 MED ORDER — ESOMEPRAZOLE MAGNESIUM 40 MG PO CPDR: 40.0000 mg | DELAYED_RELEASE_CAPSULE | Freq: Every day | ORAL | Status: DC | PRN

## 2013-04-23 NOTE — Telephone Encounter (Signed)
Pt needs refill of his NEXIUM. (90 day supply) Pt states he gets free esomeprazole (NEXIUM) 40 MG capsule from [email or web address] . They require MD to fax the script.It needs to state: Patient's 1st name, last name Date of Birth Pt's ID #  [id number] Fax no:  [phone]

## 2013-04-23 NOTE — Telephone Encounter (Signed)
Spoke to pt told him will fax Rx to [facility] as requested. Pt verbalized understanding. Rx for Nexium faxed.

## 2013-04-29 ENCOUNTER — Encounter: Payer: Self-pay | Admitting: Infectious Disease

## 2013-04-29 ENCOUNTER — Telehealth: Payer: Self-pay | Admitting: *Deleted

## 2013-04-29 ENCOUNTER — Ambulatory Visit (INDEPENDENT_AMBULATORY_CARE_PROVIDER_SITE_OTHER): Payer: Self-pay | Admitting: Infectious Disease

## 2013-04-29 VITALS — BP 130/82 | HR 90 | Temp 98.3°F | Ht 73.0 in | Wt 232.0 lb

## 2013-04-29 DIAGNOSIS — N419 Inflammatory disease of prostate, unspecified: Secondary | ICD-10-CM

## 2013-04-29 DIAGNOSIS — R300 Dysuria: Secondary | ICD-10-CM | POA: Insufficient documentation

## 2013-04-29 DIAGNOSIS — B20 Human immunodeficiency virus [HIV] disease: Secondary | ICD-10-CM

## 2013-04-29 MED ORDER — SULFAMETHOXAZOLE-TMP DS 800-160 MG PO TABS: 1.0000 | ORAL_TABLET | Freq: Two times a day (BID) | ORAL | Status: DC

## 2013-04-29 NOTE — Telephone Encounter (Signed)
Pt reports -  Urine color change several days ago, burning last night w/ possible slight whitish discharge.  Requesting appt.  Dr. Daiva Eves for this afternoon.

## 2013-04-29 NOTE — Telephone Encounter (Signed)
Looks like he is already on the schedule. I am now already double booked for two appt times

## 2013-04-29 NOTE — Progress Notes (Signed)
Subjective:    Patient ID: Todd Carroll, male    DOB: [birth year], 37 y.o.   MRN: [id number]  Dysuria  Pertinent negatives include no chills, flank pain, hematuria, nausea or vomiting.    37 year old [place] American man recently diagnosed with HIV and recently started on STRIBILD, [place]ent[place] to clinic with a three-day history of dysuria. He has not had frank penile discharge but rather more dysuria and polyuria. He states he has not had unprotected sex for nearly 6 months and no sexual activity at all for the past 3 months. His urine gonorrhea and Chlamydia screens were negative of note in April 2014. Otherwise he is tolerating his antiretroviral regimen without difficulties. He has no fevers nausea or malaise.  Review of Systems  Constitutional: Negative for fever, chills, diaphoresis, activity change, appetite change, fatigue and unexpected weight change.  HENT: Negative for congestion, sore throat, rhinorrhea, sneezing, trouble swallowing and sinus pressure.   Eyes: Negative for photophobia and visual disturbance.  Respiratory: Negative for cough, chest tightness, shortness of breath, wheezing and stridor.   Cardiovascular: Negative for chest pain, palpitations and leg swelling.  Gastrointestinal: Negative for nausea, vomiting, abdominal pain, diarrhea, constipation, blood in stool, abdominal distention and anal bleeding.  Genitourinary: Positive for dysuria. Negative for hematuria, flank pain and difficulty urinating.  Musculoskeletal: Negative for myalgias, back pain, joint swelling, arthralgias and gait problem.  Skin: Negative for color change, pallor, rash and wound.  Neurological: Negative for dizziness, tremors, weakness and light-headedness.  Hematological: Negative for adenopathy. Does not bruise/bleed easily.  Psychiatric/Behavioral: Negative for behavioral problems, confusion, sleep disturbance, dysphoric mood, decreased concentration and agitation.       Objective:   Physical Exam  Constitutional: He is oriented to person, place, and time. He appears well-developed and well-nourished. No distress.  HENT:  Head: Normocephalic and atraumatic.  Mouth/Throat: Oropharynx is clear and moist. No oropharyngeal exudate.  Eyes: Conjunctivae and EOM are normal. Pupils are equal, round, and reactive to light. No scleral icterus.  Neck: Normal range of motion. Neck supple. No JVD present.  Cardiovascular: Normal rate, regular rhythm and normal heart sounds.  Exam reveals no gallop and no friction rub.   No murmur heard. Pulmonary/Chest: Effort normal and breath sounds normal. No respiratory distress. He has no wheezes. He has no rales. He exhibits no tenderness.  Abdominal: He exhibits no distension and no mass. There is no tenderness. There is no rebound and no guarding.  Genitourinary: Prostate is not enlarged and not tender.  Musculoskeletal: He exhibits no edema and no tenderness.  Lymphadenopathy:    He has no cervical adenopathy.  Neurological: He is alert and oriented to person, place, and time. He has normal reflexes. He exhibits normal muscle tone. Coordination normal.  Skin: Skin is warm and dry. He is not diaphoretic. No erythema. No pallor.  Psychiatric: He has a normal mood and affect. His behavior is normal. Judgment and thought content normal.          Assessment & Plan:   #1 Dysuria: Not clearly a syndrome of penile discharge but I will get a gonorrhea and Chlamydia tests from urine. We'll also obtain a post prostate exam culture. Will start patient on Bactrim one double strength tablet twice daily giving him presumptive diagnosis of prostatitis although his prostate was not markedly enlarged on exam.  #2 HIV:: He is tolerating his single tablet regimen quite well recheck labs to document virological drop   #3 Prostatitis: see above ,not clearly the  diagnosis

## 2013-04-30 LAB — URINALYSIS, ROUTINE W REFLEX MICROSCOPIC
Glucose, UA: NEGATIVE mg/dL
Hgb urine dipstick: NEGATIVE
Ketones, ur: NEGATIVE mg/dL
Nitrite: NEGATIVE
Specific Gravity, Urine: 1.012 (ref 1.005–1.030)
pH: 7 (ref 5.0–8.0)

## 2013-04-30 LAB — URINALYSIS, MICROSCOPIC ONLY
Crystals: NONE SEEN
Squamous Epithelial / LPF: NONE SEEN

## 2013-05-01 LAB — URINE CULTURE: Organism ID, Bacteria: NO GROWTH

## 2013-05-19 ENCOUNTER — Other Ambulatory Visit (INDEPENDENT_AMBULATORY_CARE_PROVIDER_SITE_OTHER): Payer: Self-pay

## 2013-05-19 DIAGNOSIS — B20 Human immunodeficiency virus [HIV] disease: Secondary | ICD-10-CM

## 2013-05-19 LAB — CBC WITH DIFFERENTIAL/PLATELET
Basophils Relative: 0 % (ref 0–1)
Eosinophils Absolute: 0.1 10*3/uL (ref 0.0–0.7)
Eosinophils Relative: 2 % (ref 0–5)
Hemoglobin: 13.1 g/dL (ref 13.0–17.0)
Lymphs Abs: 3.2 10*3/uL (ref 0.7–4.0)
MCH: 29.6 pg (ref 26.0–34.0)
MCHC: 34 g/dL (ref 30.0–36.0)
MCV: 87.1 fL (ref 78.0–100.0)
Monocytes Absolute: 0.6 10*3/uL (ref 0.1–1.0)
Monocytes Relative: 8 % (ref 3–12)
RBC: 4.42 MIL/uL (ref 4.22–5.81)

## 2013-05-19 LAB — COMPREHENSIVE METABOLIC PANEL
BUN: 13 mg/dL (ref 6–23)
CO2: 25 mEq/L (ref 19–32)
Calcium: 9.2 mg/dL (ref 8.4–10.5)
Chloride: 103 mEq/L (ref 96–112)
Creat: 1.42 mg/dL — ABNORMAL HIGH (ref 0.50–1.35)
Glucose, Bld: 95 mg/dL (ref 70–99)
Total Bilirubin: 0.3 mg/dL (ref 0.3–1.2)

## 2013-05-20 LAB — T-HELPER CELL (CD4) - (RCID CLINIC ONLY)
CD4 % Helper T Cell: 34 % (ref 33–55)
CD4 T Cell Abs: 1090 uL (ref 400–2700)

## 2013-05-29 ENCOUNTER — Encounter: Payer: Self-pay | Admitting: *Deleted

## 2013-06-02 ENCOUNTER — Encounter: Payer: Self-pay | Admitting: Internal Medicine

## 2013-06-02 ENCOUNTER — Ambulatory Visit (INDEPENDENT_AMBULATORY_CARE_PROVIDER_SITE_OTHER): Payer: Self-pay | Admitting: Internal Medicine

## 2013-06-02 VITALS — BP 118/82 | HR 83 | Temp 98.1°F | Wt 233.0 lb

## 2013-06-02 DIAGNOSIS — Z23 Encounter for immunization: Secondary | ICD-10-CM

## 2013-06-02 DIAGNOSIS — B20 Human immunodeficiency virus [HIV] disease: Secondary | ICD-10-CM

## 2013-06-02 NOTE — Progress Notes (Signed)
[facility] CLIN[facility] NOTE  RFV: routine Subjective:    Patient ID: Todd Carroll, male    DOB: July 19, 1976, 37 y.o.   MRN: [id number]  HPI 37yo M with HIV, CD 4 count 1090/VL <20, on stribild since April 2014. Doing wel with adherence. Did see dr. Zenaida Niece dam at end of May for prostatitis given rx for bactrim.  Any urinary symptoms, dysuria, when he noticed that he has blood in his semen from ejaculation. Itchy like sensation for urination, intermittent. Had it 2 wks ago. But now resolved.  Current Outpatient Prescriptions on File Prior to Visit  Medication Sig Dispense Refill  . elvitegravir-cobicistat-emtricitabine-tenofovir (STRIBILD) 150-150-200-300 MG TABS Take 1 tablet by mouth daily with breakfast.  30 tablet  5  . esomeprazole (NEXIUM) 40 MG capsule Take 1 capsule (40 mg total) by mouth daily as needed.  90 capsule  3  . sulfamethoxazole-trimethoprim (BACTRIM DS) 800-160 MG per tablet Take 1 tablet by mouth 2 (two) times daily.  60 tablet  1   No current facility-administered medications on file prior to visit.   Active Ambulatory Problems    Diagnosis Date Noted  . ALLERGIC RHINITIS 08/27/2007  . GERD 05/04/2008  . Dysuria 04/29/2013  . Human immunodeficiency virus (HIV) disease 02/27/2013   Resolved Ambulatory Problems    Diagnosis Date Noted  . FUNGAL DERMATITIS 09/15/2007  . SORE THROAT 02/22/2010  . URI 11/25/2007  . GASTROENTERITIS 05/04/2008  . NECK PAIN, RIGHT 08/03/2008  . Dysuria 11/25/2007   No Additional Past Medical History       Review of Systems Review of Systems  Constitutional: Negative for fever, chills, diaphoresis, activity change, appetite change, fatigue and unexpected weight change.  HENT: Negative for congestion, sore throat, rhinorrhea, sneezing, trouble swallowing and sinus pressure.  Eyes: Negative for photophobia and visual disturbance.  Respiratory: Negative for cough, chest tightness, shortness of breath, wheezing and stridor.   Cardiovascular: Negative for chest pain, palpitations and leg swelling.  Gastrointestinal: Negative for nausea, vomiting, abdominal pain, diarrhea, constipation, blood in stool, abdominal distention and anal bleeding.  Genitourinary: Negative for dysuria, hematuria, flank pain and difficulty urinating.  Musculoskeletal: Negative for myalgias, back pain, joint swelling, arthralgias and gait problem.  Skin: Negative for color change, pallor, rash and wound.  Neurological: Negative for dizziness, tremors, weakness and light-headedness.  Hematological: Negative for adenopathy. Does not bruise/bleed easily.  Psychiatric/Behavioral: Negative for behavioral problems, confusion, sleep disturbance, dysphoric mood, decreased concentration and agitation.       Objective:   Physical Exam Physical Exam  Constitutional: He is oriented to person, place, and time. He appears well-developed and well-nourished. No distress.  HENT:  Mouth/Throat: Oropharynx is clear and moist. No oropharyngeal exudate.  Cardiovascular: Normal rate, regular rhythm and normal heart sounds. Exam reveals no gallop and no friction rub.  No murmur heard.  Pulmonary/Chest: Effort normal and breath sounds normal. No respiratory distress. He has no wheezes.  Abdominal: Soft. Bowel sounds are normal. He exhibits no distension. There is no tenderness.  Lymphadenopathy:  He has no cervical adenopathy.  Neurological: He is alert and oriented to person, place, and time.  Skin: Skin is warm and dry. No rash noted. No erythema.  Psychiatric: He has a normal mood and affect. His behavior is normal.        Assessment & Plan:  HIV = continue with stribild, and excellent adherence  Health maintenance = will get hep B #2 today, somewhat off schedule by 1 month.  GERD = continue  on nexium

## 2013-06-04 ENCOUNTER — Telehealth: Payer: Self-pay | Admitting: *Deleted

## 2013-06-04 ENCOUNTER — Ambulatory Visit (INDEPENDENT_AMBULATORY_CARE_PROVIDER_SITE_OTHER): Payer: Self-pay | Admitting: Internal Medicine

## 2013-06-04 ENCOUNTER — Encounter: Payer: Self-pay | Admitting: Internal Medicine

## 2013-06-04 VITALS — BP 116/80 | HR 103 | Temp 98.3°F | Wt 234.0 lb

## 2013-06-04 VITALS — BP 126/80 | HR 97 | Temp 99.1°F | Resp 20 | Wt 238.0 lb

## 2013-06-04 DIAGNOSIS — B20 Human immunodeficiency virus [HIV] disease: Secondary | ICD-10-CM

## 2013-06-04 DIAGNOSIS — S40029A Contusion of unspecified upper arm, initial encounter: Secondary | ICD-10-CM

## 2013-06-04 DIAGNOSIS — S40022A Contusion of left upper arm, initial encounter: Secondary | ICD-10-CM

## 2013-06-04 DIAGNOSIS — K047 Periapical abscess without sinus: Secondary | ICD-10-CM

## 2013-06-04 MED ORDER — PENICILLIN V POTASSIUM 500 MG PO TABS: 500.0000 mg | ORAL_TABLET | Freq: Four times a day (QID) | ORAL | Status: DC

## 2013-06-04 MED ORDER — TRAMADOL HCL 50 MG PO TABS: 50.0000 mg | ORAL_TABLET | Freq: Four times a day (QID) | ORAL | Status: DC | PRN
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
## 2013-06-04 NOTE — Progress Notes (Signed)
[facility] CL[facility] NOTE  RFV: sick visit  Subjective:    Patient ID: Todd Carroll, male    DOB: [birth year], 37 y.o.   MRN: [id number]  HPI HIV, CD 4 count 1090/VL <20, on stribild since April 2014, seen 2 days ago in clinic but now has developed tooth pain to right lower back molar. Has had difficulty with it in the past.   He reports itching to having pain medication in the past. He thinks he had percocet  Current Outpatient Prescriptions on File Prior to Visit  Medication Sig Dispense Refill  . elvitegravir-cobicistat-emtricitabine-tenofovir (STRIBILD) 150-150-200-300 MG TABS Take 1 tablet by mouth daily with breakfast.  30 tablet  5  . esomeprazole (NEXIUM) 40 MG capsule Take 1 capsule (40 mg total) by mouth daily as needed.  90 capsule  3  . sulfamethoxazole-trimethoprim (BACTRIM DS) 800-160 MG per tablet Take 1 tablet by mouth 2 (two) times daily.  60 tablet  1   No current facility-administered medications on file prior to visit.   Active Ambulatory Problems    Diagnosis Date Noted  . ALLERGIC RHINITIS 08/27/2007  . GERD 05/04/2008  . Dysuria 04/29/2013  . Human immunodeficiency virus (HIV) disease 02/27/2013   Resolved Ambulatory Problems    Diagnosis Date Noted  . FUNGAL DERMATITIS 09/15/2007  . SORE THROAT 02/22/2010  . URI 11/25/2007  . GASTROENTERITIS 05/04/2008  . NECK PAIN, RIGHT 08/03/2008  . Dysuria 11/25/2007   No Additional Past Medical History     Review of Systems  12 point ROS except positive pertinents in hpi      Objective:   Physical Exam BP 116/80  Pulse 103  Temp(Src) 98.3 F (36.8 C) (Oral)  Wt 234 lb (106.142 kg)  BMI 30.88 kg/m2 Physical Exam  Constitutional: He is oriented to person, place, and time. He appears well-developed and well-nourished. No distress.  HENT: OP clear, no thrush, right lower molar appears broken on posterior aspect of tooth. Abrasion to mucosa, swollen gum line.        Assessment & Plan:  Tooth  decay/peridontal disease possible abscess= will give rx for pen VK and tramadol rx since he complains of 8 of 10 pain. Also recommend that he can use ibuprofen 800mg  Q 8hr

## 2013-06-04 NOTE — Telephone Encounter (Signed)
RN checked with [facility] to see where the pt is on the "dental clinic list."  He is listed as over 100.  RN asked that a comment be added to his name that he is experiencing pain and requesting "something for pain today."  RN checked with Dr. Drue Second about pt's problem.  Will "work-in" pt to this morning's schedule.  Message left for pt to call [facility] for a "work-in" appt this morning (06/04/13).

## 2013-06-04 NOTE — Patient Instructions (Signed)
You  may move around, but avoid painful motions and activities.  Apply ice to the sore area for 15 to 20 minutes 3 or 4 times daily for the next two to 3 days. 

## 2013-06-08 NOTE — Telephone Encounter (Signed)
Pt came for appt

## 2013-07-13 ENCOUNTER — Encounter: Payer: Self-pay | Admitting: Internal Medicine

## 2013-07-13 ENCOUNTER — Ambulatory Visit (INDEPENDENT_AMBULATORY_CARE_PROVIDER_SITE_OTHER): Payer: Self-pay | Admitting: Internal Medicine

## 2013-07-13 VITALS — BP 124/79 | HR 80 | Temp 98.4°F | Wt 233.0 lb

## 2013-07-13 DIAGNOSIS — K089 Disorder of teeth and supporting structures, unspecified: Secondary | ICD-10-CM

## 2013-07-13 DIAGNOSIS — L739 Follicular disorder, unspecified: Secondary | ICD-10-CM

## 2013-07-13 DIAGNOSIS — K0889 Other specified disorders of teeth and supporting structures: Secondary | ICD-10-CM

## 2013-07-13 DIAGNOSIS — L738 Other specified follicular disorders: Secondary | ICD-10-CM

## 2013-07-13 MED ORDER — HYDROCORTISONE 1 % EX LOTN: TOPICAL_LOTION | Freq: Two times a day (BID) | CUTANEOUS | Status: DC

## 2013-07-13 NOTE — Progress Notes (Signed)
Patient ID: Todd Carroll, male   DOB: Oct 15, 1976, 37 y.o.   MRN: [id number]          [facility]  Patient Active Problem List   Diagnosis Date Noted  . Folliculitis 07/13/2013  . Pain, dental 07/13/2013  . Dysuria 04/29/2013  . Human immunodeficiency virus (HIV) disease 02/27/2013  . GERD 05/04/2008  . ALLERGIC RHINITIS 08/27/2007    Patient's Medications  New Prescriptions   HYDROCORTISONE 1 % LOTION    Apply topically 2 (two) times daily.  Previous Medications   ELVITEGRAVIR-COBICISTAT-EMTRICITABINE-TENOFOVIR (STRIBILD) 150-150-200-300 MG TABS    Take 1 tablet by mouth daily with breakfast.   ESOMEPRAZOLE (NEXIUM) 40 MG CAPSULE    Take 1 capsule (40 mg total) by mouth daily as needed.   MOMETASONE (NASONEX) 50 MCG/ACT NASAL SPRAY    Place 2 sprays into the nose daily.  Modified Medications   No medications on file  Discontinued Medications   SULFAMETHOXAZOLE-TRIMETHOPRIM (BACTRIM DS) 800-160 MG PER TABLET    Take 1 tablet by mouth 2 (two) times daily.   TRAMADOL (ULTRAM) 50 MG TABLET    Take 1 tablet (50 mg total) by mouth every 6 (six) hours as needed for pain.    Subjective: Todd Carroll is seen on a work in basis. He states that 2-3 days ago he began to develop a pruritic rash on his anterior chest. About 2 weeks ago he used a depilatory cream on his chest to remove hair. He's never had a rash like this before. He is also complaining of left mandibular tooth pain and says that Toradol does not help. He denies missing any doses of his Stribild. Has not been on any new medications recently other than Benadryl which does not help his itching.  Review of Systems: Pertinent items are noted in HPI.  Past Medical History  Diagnosis Date  . ALLERGIC RHINITIS 08/27/2007  . GERD 05/04/2008    History  Substance Use Topics  . Smoking status: Never Smoker   . Smokeless tobacco: Never Used  . Alcohol Use: 0.5 oz/week    1 drink(s) per week   Comment: social , liquor    No family history on file.  No Known Allergies  Objective: Temp: 98.4 F (36.9 C) (08/11 1506) Temp src: Oral (08/11 1506) BP: 124/79 mmHg (08/11 1506) Pulse Rate: 80 (08/11 1506)  General: He is alert and in no distress Oral: No gum swelling or tenderness nose around the tooth that he states is bothering him Skin: Red papular rash on anterior chest. It is difficult to know if this is follicular-based because he said are removal.  Lab Results HIV 1 RNA Quant (copies/mL)  Date Value  05/19/2013 <20   03/19/2013 38421*  11/20/2011 <20      CD4 T Cell Abs (cmm)  Date Value  05/19/2013 1090   03/19/2013 600      Assessment: I suspect that he has folliculitis related to the recent use of depilatory cream. I will treat him with topical steroids.  I do not see any clear evidence of tooth abscess. I will order an orthopantogram today. I told him I did not think that he needed narcotic pain medication stronger than Toradol.  Plan: 1. Continue Stribild 2. Topical hydrocortisone cream 3. Orthopantogram   Cliffton Asters, MD [facility] [phone] pager   [phone] cell 07/13/2013, 3:58 PM

## 2013-07-17 ENCOUNTER — Ambulatory Visit (HOSPITAL_COMMUNITY)
Admission: RE | Admit: 2013-07-17 | Discharge: 2013-07-17 | Disposition: A | Discharge status: Home / Self Care | Payer: Self-pay | Source: Ambulatory Visit | Attending: Internal Medicine | Admitting: Internal Medicine

## 2013-07-17 DIAGNOSIS — K029 Dental caries, unspecified: Secondary | ICD-10-CM | POA: Insufficient documentation

## 2013-07-17 DIAGNOSIS — K0889 Other specified disorders of teeth and supporting structures: Secondary | ICD-10-CM

## 2013-07-17 DIAGNOSIS — K089 Disorder of teeth and supporting structures, unspecified: Secondary | ICD-10-CM | POA: Insufficient documentation

## 2013-08-10 ENCOUNTER — Telehealth: Payer: Self-pay | Admitting: *Deleted

## 2013-08-10 NOTE — Telephone Encounter (Signed)
RN left message for pt to call [facility] if he has had a return of symptoms of UTI/Prostatitis as diagnosed 04/29/13 by Dr. Daiva Eves.  When pt was seen on 07/13/13 by Dr. Orvan Falconer, Bactrim DS was discontinued from the pt's medication profile.

## 2013-08-10 NOTE — Telephone Encounter (Signed)
Message left for [facility] that Bactrim DS was discontinued by Dr. Orvan Falconer 07/13/13.

## 2013-09-09 ENCOUNTER — Other Ambulatory Visit: Payer: Self-pay | Admitting: Internal Medicine

## 2013-09-09 DIAGNOSIS — B20 Human immunodeficiency virus [HIV] disease: Secondary | ICD-10-CM

## 2013-09-09 MED ORDER — ELVITEG-COBIC-EMTRICIT-TENOFDF 150-150-200-300 MG PO TABS: 1.0000 | ORAL_TABLET | Freq: Every day | ORAL | Status: DC

## 2013-09-10 ENCOUNTER — Other Ambulatory Visit: Payer: Self-pay | Admitting: *Deleted

## 2013-09-10 DIAGNOSIS — B20 Human immunodeficiency virus [HIV] disease: Secondary | ICD-10-CM

## 2013-09-10 MED ORDER — ELVITEG-COBIC-EMTRICIT-TENOFDF 150-150-200-300 MG PO TABS: 1.0000 | ORAL_TABLET | Freq: Every day | ORAL | Status: DC

## 2013-09-10 NOTE — Telephone Encounter (Signed)
AD[facility] 

## 2013-10-06 ENCOUNTER — Ambulatory Visit: Payer: Self-pay | Admitting: Internal Medicine

## 2013-10-06 ENCOUNTER — Telehealth: Payer: Self-pay | Admitting: *Deleted

## 2013-10-06 NOTE — Telephone Encounter (Signed)
Requested pt call [facility] dental coordinator to arrange appt for dental pain.  November appts available per the coordinator.

## 2013-10-08 ENCOUNTER — Other Ambulatory Visit: Payer: Self-pay

## 2013-10-14 ENCOUNTER — Other Ambulatory Visit: Payer: Self-pay | Admitting: *Deleted

## 2013-10-14 DIAGNOSIS — B20 Human immunodeficiency virus [HIV] disease: Secondary | ICD-10-CM

## 2013-10-14 MED ORDER — ELVITEG-COBIC-EMTRICIT-TENOFDF 150-150-200-300 MG PO TABS: 1.0000 | ORAL_TABLET | Freq: Every day | ORAL | Status: DC

## 2013-10-22 ENCOUNTER — Other Ambulatory Visit: Payer: Self-pay | Admitting: Licensed Clinical Social Worker

## 2013-10-22 ENCOUNTER — Telehealth: Payer: Self-pay | Admitting: *Deleted

## 2013-10-22 ENCOUNTER — Ambulatory Visit: Payer: Self-pay | Admitting: Internal Medicine

## 2013-10-22 ENCOUNTER — Telehealth: Payer: Self-pay | Admitting: Licensed Clinical Social Worker

## 2013-10-22 DIAGNOSIS — Z113 Encounter for screening for infections with a predominantly sexual mode of transmission: Secondary | ICD-10-CM

## 2013-10-22 DIAGNOSIS — B20 Human immunodeficiency virus [HIV] disease: Secondary | ICD-10-CM

## 2013-10-22 DIAGNOSIS — R82998 Other abnormal findings in urine: Secondary | ICD-10-CM

## 2013-10-22 NOTE — Telephone Encounter (Signed)
Called patient about no show and had to leave a message for him to call the office and reschedule his appt.

## 2013-10-22 NOTE — Telephone Encounter (Signed)
Patient was notified for a missed appointment today, he called back requesting to reschedule and mentioned that he was concerned that his urine was a dark brown color. I noticed he needed lab work so I asked him to come in tomorrow for labs and urine dipstick. If the urine is positive for infection we can ask one of the providers if they could prescribe something. Patient was told to come in before 69.

## 2013-10-23 ENCOUNTER — Ambulatory Visit (INDEPENDENT_AMBULATORY_CARE_PROVIDER_SITE_OTHER): Payer: Self-pay | Admitting: Infectious Diseases

## 2013-10-23 ENCOUNTER — Encounter: Payer: Self-pay | Admitting: Infectious Diseases

## 2013-10-23 ENCOUNTER — Other Ambulatory Visit: Payer: Self-pay

## 2013-10-23 VITALS — BP 124/79 | HR 85 | Temp 98.2°F | Ht 73.0 in | Wt 227.2 lb

## 2013-10-23 DIAGNOSIS — B20 Human immunodeficiency virus [HIV] disease: Secondary | ICD-10-CM

## 2013-10-23 DIAGNOSIS — K089 Disorder of teeth and supporting structures, unspecified: Secondary | ICD-10-CM

## 2013-10-23 DIAGNOSIS — R300 Dysuria: Secondary | ICD-10-CM

## 2013-10-23 DIAGNOSIS — Z23 Encounter for immunization: Secondary | ICD-10-CM

## 2013-10-23 DIAGNOSIS — K0889 Other specified disorders of teeth and supporting structures: Secondary | ICD-10-CM

## 2013-10-23 LAB — COMPREHENSIVE METABOLIC PANEL
BUN: 14 mg/dL (ref 6–23)
CO2: 30 mEq/L (ref 19–32)
Chloride: 101 mEq/L (ref 96–112)
Creat: 1.1 mg/dL (ref 0.50–1.35)
Glucose, Bld: 62 mg/dL — ABNORMAL LOW (ref 70–99)
Total Bilirubin: 0.3 mg/dL (ref 0.3–1.2)

## 2013-10-23 LAB — CBC
Hemoglobin: 13.3 g/dL (ref 13.0–17.0)
MCH: 29.8 pg (ref 26.0–34.0)
MCV: 88.3 fL (ref 78.0–100.0)
Platelets: 444 10*3/uL — ABNORMAL HIGH (ref 150–400)
RBC: 4.46 MIL/uL (ref 4.22–5.81)
WBC: 8.5 10*3/uL (ref 4.0–10.5)

## 2013-10-23 MED ORDER — AMOXICILLIN-POT CLAVULANATE 875-125 MG PO TABS: 1.0000 | ORAL_TABLET | Freq: Two times a day (BID) | ORAL | Status: AC

## 2013-10-23 MED ORDER — ACETAMINOPHEN-CODEINE #3 300-30 MG PO TABS: 1.0000 | ORAL_TABLET | Freq: Three times a day (TID) | ORAL | Status: DC | PRN

## 2013-10-23 NOTE — Assessment & Plan Note (Addendum)
Will check urine gc/chlamydia. Check RPR. Check UCx, UA.

## 2013-10-23 NOTE — Assessment & Plan Note (Signed)
Will check labs today. 2nd Hep A today. Give flu shot. See back in 3-4 months. Has condoms.

## 2013-10-23 NOTE — Assessment & Plan Note (Signed)
Will give rx for augmentin. Short course of tylenol #3. Asked him to call dentist on Monday if still in pain.

## 2013-10-23 NOTE — Addendum Note (Signed)
Addended by: Jennet Maduro D on: 10/23/2013 03:43 PM   Modules accepted: Orders

## 2013-10-23 NOTE — Addendum Note (Signed)
Addended by: Mariea Clonts D on: 10/23/2013 04:27 PM   Modules accepted: Orders

## 2013-10-23 NOTE — Progress Notes (Signed)
  Subjective:    Patient ID: Todd Carroll, male    DOB: February 02, 1976, 37 y.o.   MRN: [id number]  Dental Pain  Associated symptoms include a fever.  Sore Throat  Associated symptoms include trouble swallowing. Pertinent negatives include no diarrhea.   37 yo M with hx of HIV+ (on stribild), and prev dental abscesses.  Cr mildly abn. Has been having a few weeks of urinary burning, throat sore. Hoarse.Mild dysphagia. Felt feverish 1 day. No urinary d/c or cloudiness. No new partners.  Had tooth pulled ~ 1 week ago, got tramadol which did not help, still has pain.   HIV 1 RNA Quant (copies/mL)  Date Value  05/19/2013 <20   03/19/2013 38421*  11/20/2011 <20      CD4 T Cell Abs (cmm)  Date Value  05/19/2013 1090   03/19/2013 600    Review of Systems  Constitutional: Positive for fever. Negative for chills, appetite change and unexpected weight change.  HENT: Positive for sore throat, trouble swallowing and voice change.   Gastrointestinal: Negative for nausea and diarrhea.  Genitourinary: Positive for dysuria. Negative for frequency, discharge and genital sores.       Objective:   Physical Exam  Constitutional: He appears well-developed and well-nourished.  HENT:  Mouth/Throat: No oropharyngeal exudate.    Neck: Neck supple.  Cardiovascular: Normal rate, regular rhythm and normal heart sounds.   Pulmonary/Chest: Effort normal and breath sounds normal.  Abdominal: Soft. Bowel sounds are normal. He exhibits no distension. There is no tenderness.  Lymphadenopathy:    He has no cervical adenopathy.          Assessment & Plan:

## 2013-10-24 LAB — URINALYSIS, ROUTINE W REFLEX MICROSCOPIC
Leukocytes, UA: NEGATIVE
Nitrite: NEGATIVE
Protein, ur: NEGATIVE mg/dL
Urobilinogen, UA: 1 mg/dL (ref 0.0–1.0)

## 2013-10-24 LAB — RPR

## 2013-10-25 LAB — URINE CULTURE
Colony Count: NO GROWTH
Organism ID, Bacteria: NO GROWTH

## 2013-10-27 LAB — HIV-1 RNA QUANT-NO REFLEX-BLD
HIV 1 RNA Quant: <20 copies/mL (ref <20)
HIV-1 RNA Quant, Log: <1.3 {Log} (ref <1.30)

## 2013-11-06 ENCOUNTER — Other Ambulatory Visit: Payer: Self-pay | Admitting: *Deleted

## 2013-11-06 DIAGNOSIS — B20 Human immunodeficiency virus [HIV] disease: Secondary | ICD-10-CM

## 2013-11-06 MED ORDER — ELVITEG-COBIC-EMTRICIT-TENOFDF 150-150-200-300 MG PO TABS: 1.0000 | ORAL_TABLET | Freq: Every day | ORAL | Status: DC

## 2013-11-30 ENCOUNTER — Other Ambulatory Visit: Payer: Self-pay | Admitting: Licensed Clinical Social Worker

## 2013-11-30 ENCOUNTER — Other Ambulatory Visit: Payer: Self-pay

## 2013-11-30 DIAGNOSIS — B20 Human immunodeficiency virus [HIV] disease: Secondary | ICD-10-CM

## 2013-12-01 LAB — T-HELPER CELL (CD4) - (RCID CLINIC ONLY): CD4 % Helper T Cell: 29 % — ABNORMAL LOW (ref 33–55)

## 2013-12-30 ENCOUNTER — Telehealth: Payer: Self-pay | Admitting: *Deleted

## 2013-12-30 NOTE — Telephone Encounter (Signed)
Patient called and advised he has a rash on his arm and chest. Advised it is red, pimply and slightly painful and has been there for 2 days. No fevers, chills or shortness of breath. Offered him an appt with Dr Luciana Axeomer and the patient only wants to see his doctor who's next available is 01/11/14. The patient accepted the appt 01/11/14 at 315 pm. Advised him if his symptoms change or get worse to give the office a call and we can see if we can work him in as an emergency.

## 2014-01-04 ENCOUNTER — Telehealth: Payer: Self-pay

## 2014-01-04 NOTE — Telephone Encounter (Signed)
Patient is having continued problems with rash on arm.  Rash continues to itch . Patient states he was told to call for office visit if symptoms continued.  OV given  Laurell Josephsammy K Eldridge Marcott, RN

## 2014-01-05 ENCOUNTER — Ambulatory Visit (INDEPENDENT_AMBULATORY_CARE_PROVIDER_SITE_OTHER): Payer: Self-pay | Admitting: Internal Medicine

## 2014-01-05 ENCOUNTER — Encounter: Payer: Self-pay | Admitting: Internal Medicine

## 2014-01-05 VITALS — BP 131/93 | HR 89 | Temp 98.5°F | Ht 73.0 in | Wt 237.0 lb

## 2014-01-05 DIAGNOSIS — Z23 Encounter for immunization: Secondary | ICD-10-CM

## 2014-01-05 DIAGNOSIS — R21 Rash and other nonspecific skin eruption: Secondary | ICD-10-CM

## 2014-01-05 DIAGNOSIS — B20 Human immunodeficiency virus [HIV] disease: Secondary | ICD-10-CM

## 2014-01-05 MED ORDER — HYDROCORTISONE 1 % EX OINT: 1.0000 "application " | TOPICAL_OINTMENT | Freq: Two times a day (BID) | CUTANEOUS | Status: DC

## 2014-01-05 NOTE — Assessment & Plan Note (Signed)
Hep b today

## 2014-01-05 NOTE — Assessment & Plan Note (Signed)
Reactive vs mites.  Will try topical steroid oint and he will call if no improvement.  Will try permetherin if persists.  It does spare interdigit webbing.

## 2014-01-05 NOTE — Progress Notes (Signed)
   Subjective:    Patient ID: Todd Carroll, male    DOB: [birth year], 38 y.o.   MRN: [id number]  HPI Here for a work in visit.  For more than 1 weeks has had bumps on his arm.  No particularly itchiness, not painful.  On forearms and chest. No fever or chills.    Review of Systems  Constitutional: Negative for fever.  Skin: Negative for rash.       Objective:   Physical Exam  Constitutional: He appears well-developed and well-nourished. No distress.  Skin: Rash noted.  Pinpoint, erythematous rash on forearms and anterior torso, multiple  Psychiatric: He has a normal mood and affect.          Assessment & Plan:

## 2014-01-11 ENCOUNTER — Ambulatory Visit: Payer: Self-pay | Admitting: Infectious Diseases

## 2014-02-22 ENCOUNTER — Ambulatory Visit: Payer: Self-pay | Admitting: Internal Medicine

## 2014-03-22 ENCOUNTER — Other Ambulatory Visit: Payer: Self-pay | Admitting: Internal Medicine

## 2014-03-24 ENCOUNTER — Ambulatory Visit: Payer: Self-pay

## 2014-03-31 ENCOUNTER — Encounter: Payer: Self-pay | Admitting: *Deleted

## 2014-03-31 ENCOUNTER — Telehealth: Payer: Self-pay | Admitting: *Deleted

## 2014-03-31 DIAGNOSIS — B20 Human immunodeficiency virus [HIV] disease: Secondary | ICD-10-CM

## 2014-03-31 MED ORDER — ELVITEG-COBIC-EMTRICIT-TENOFDF 150-150-200-300 MG PO TABS: ORAL_TABLET | ORAL | Status: DC

## 2014-03-31 NOTE — Telephone Encounter (Signed)
ADAP application 

## 2014-03-31 NOTE — Progress Notes (Signed)
Patient ID: Todd Carroll, male   DOB: [birth year], 38 y.o.   MRN: [id number] Walk-in to [facility].  Pt c/o of sore throat, productive cough w/ yellowish-brown sputum, slight laryngitis, no fever or chills.  Requesting appt.  Given appt for tomorrow w/ Dr. Drue SecondSnider.

## 2014-04-01 ENCOUNTER — Ambulatory Visit: Payer: Self-pay | Admitting: Internal Medicine

## 2014-04-07 ENCOUNTER — Ambulatory Visit (INDEPENDENT_AMBULATORY_CARE_PROVIDER_SITE_OTHER): Payer: Self-pay | Admitting: Internal Medicine

## 2014-04-07 ENCOUNTER — Encounter: Payer: Self-pay | Admitting: Internal Medicine

## 2014-04-07 VITALS — BP 123/85 | HR 83 | Temp 98.7°F | Wt 235.0 lb

## 2014-04-07 DIAGNOSIS — B20 Human immunodeficiency virus [HIV] disease: Secondary | ICD-10-CM

## 2014-04-07 LAB — CBC WITH DIFFERENTIAL/PLATELET
BASOS ABS: 0 10*3/uL (ref 0.0–0.1)
Basophils Relative: 0 % (ref 0–1)
Eosinophils Absolute: 0.2 10*3/uL (ref 0.0–0.7)
Eosinophils Relative: 2 % (ref 0–5)
HEMATOCRIT: 38.5 % — AB (ref 39.0–52.0)
Hemoglobin: 13.1 g/dL (ref 13.0–17.0)
LYMPHS ABS: 3.6 10*3/uL (ref 0.7–4.0)
LYMPHS PCT: 42 % (ref 12–46)
MCH: 29.4 pg (ref 26.0–34.0)
MCHC: 34 g/dL (ref 30.0–36.0)
MCV: 86.3 fL (ref 78.0–100.0)
MONO ABS: 0.6 10*3/uL (ref 0.1–1.0)
Monocytes Relative: 7 % (ref 3–12)
Neutro Abs: 4.2 10*3/uL (ref 1.7–7.7)
Neutrophils Relative %: 49 % (ref 43–77)
PLATELETS: 418 10*3/uL — AB (ref 150–400)
RBC: 4.46 MIL/uL (ref 4.22–5.81)
RDW: 15 % (ref 11.5–15.5)
WBC: 8.6 10*3/uL (ref 4.0–10.5)

## 2014-04-07 NOTE — Progress Notes (Signed)
   Subjective:    Patient ID: Todd RainwaterChristopher M Jaros, male    DOB: 01-18-1976, 38 y.o.   MRN: [id number]  HPI 38yo M with HIV ,cD 4 count of 760/VL<20 (winter 2014) on stribild. He is worked in for sick visit. He reports having 2 wk history of sore throat, occ productive cough. He noticed last week having exudate on tonsils but now improved. He still feels that he has lingering sore throat. No fevers, takes otc theraflu and alka seltzer with some improvement  Current Outpatient Prescriptions on File Prior to Visit  Medication Sig Dispense Refill  . elvitegravir-cobicistat-emtricitabine-tenofovir (STRIBILD) 150-150-200-300 MG TABS tablet TAKE 1 TABLET BY MOUTH DAILY WITH BREAKFAST  30 tablet  0  . esomeprazole (NEXIUM) 40 MG capsule Take 1 capsule (40 mg total) by mouth daily as needed.  90 capsule  3  . mometasone (NASONEX) 50 MCG/ACT nasal spray Place 2 sprays into the nose daily.      . hydrocortisone 1 % ointment Apply 1 application topically 2 (two) times daily. For 5 days  30 g  0   No current facility-administered medications on file prior to visit.   Active Ambulatory Problems    Diagnosis Date Noted  . ALLERGIC RHINITIS 08/27/2007  . GERD 05/04/2008  . Dysuria 04/29/2013  . Human immunodeficiency virus (HIV) disease 02/27/2013  . Folliculitis 07/13/2013  . Pain, dental 07/13/2013  . Rash and nonspecific skin eruption 01/05/2014   Resolved Ambulatory Problems    Diagnosis Date Noted  . FUNGAL DERMATITIS 09/15/2007  . SORE THROAT 02/22/2010  . URI 11/25/2007  . GASTROENTERITIS 05/04/2008  . NECK PAIN, RIGHT 08/03/2008  . Dysuria 11/25/2007   No Additional Past Medical History       Review of Systems 10 point ros is negative exept what is mentioned in hpi for uri sx    Objective:   Physical Exam BP 123/85  Pulse 83  Temp(Src) 98.7 F (37.1 C) (Oral)  Wt 235 lb (106.595 kg) Physical Exam  Constitutional: He is oriented to person, place, and time. He appears  well-developed and well-nourished. No distress.  HENT:  Mouth/Throat: Oropharynx is clear and moist. No oropharyngeal exudate. TM clear bilaterally. No conjunctivitis Cardiovascular: Normal rate, regular rhythm and normal heart sounds. Exam reveals no gallop and no friction rub.  No murmur heard.  Pulmonary/Chest: Effort normal and breath sounds normal. No respiratory distress. He has no wheezes.  Lymphadenopathy:He has no cervical adenopathy.  Skin: Skin is warm and dry. No rash noted. No erythema.  Psychiatric: He has a normal mood and affect. His behavior is normal.       Assessment & Plan:  Hiv= well controlled from last labs. Will repeat today  Uri = continue with supportive care with otc. Appears slowly resolving. No need for antibiotics  Health maintenance = finished hep b vaccination in feb 2015. Hep a vax also completed  rtc in 3 months

## 2014-04-08 LAB — COMPLETE METABOLIC PANEL WITH GFR
ALK PHOS: 99 U/L (ref 39–117)
ALT: 19 U/L (ref 0–53)
AST: 24 U/L (ref 0–37)
Albumin: 4.2 g/dL (ref 3.5–5.2)
BUN: 12 mg/dL (ref 6–23)
CHLORIDE: 101 meq/L (ref 96–112)
CO2: 25 mEq/L (ref 19–32)
Calcium: 9.4 mg/dL (ref 8.4–10.5)
Creat: 1.12 mg/dL (ref 0.50–1.35)
GFR, EST NON AFRICAN AMERICAN: 83 mL/min
GFR, Est African American: >89 mL/min
Glucose, Bld: 88 mg/dL (ref 70–99)
Potassium: 4.2 mEq/L (ref 3.5–5.3)
Sodium: 135 mEq/L (ref 135–145)
TOTAL PROTEIN: 7.3 g/dL (ref 6.0–8.3)
Total Bilirubin: 0.3 mg/dL (ref 0.2–1.2)

## 2014-04-08 LAB — HIV-1 RNA QUANT-NO REFLEX-BLD

## 2014-04-08 LAB — T-HELPER CELL (CD4) - (RCID CLINIC ONLY)
CD4 T CELL ABS: 1340 /uL (ref 400–2700)
CD4 T CELL HELPER: 35 % (ref 33–55)

## 2014-05-07 ENCOUNTER — Other Ambulatory Visit: Payer: Self-pay | Admitting: Internal Medicine

## 2014-05-25 ENCOUNTER — Ambulatory Visit (INDEPENDENT_AMBULATORY_CARE_PROVIDER_SITE_OTHER): Payer: Self-pay | Admitting: Internal Medicine

## 2014-05-25 ENCOUNTER — Encounter: Payer: Self-pay | Admitting: Internal Medicine

## 2014-05-25 VITALS — BP 142/86 | HR 76 | Temp 98.9°F | Ht 73.0 in | Wt 235.0 lb

## 2014-05-25 DIAGNOSIS — B20 Human immunodeficiency virus [HIV] disease: Secondary | ICD-10-CM

## 2014-05-25 DIAGNOSIS — L299 Pruritus, unspecified: Secondary | ICD-10-CM

## 2014-05-25 NOTE — Assessment & Plan Note (Signed)
Labs good, rtc 3 months.

## 2014-05-25 NOTE — Progress Notes (Signed)
   Subjective:    Patient ID: Todd RainwaterChristopher M Carroll, male    DOB: 03-Nov-1976, 38 y.o.   MRN: [id number]  HPI  Here for a work in visit.  Developed an itch in his groin, no rash.  Has had issues with itching over the last year.  Labs are good with undetectable virus,  CD4 of 1340.    Review of Systems  Constitutional: Negative for fatigue.  HENT: Negative for sore throat.   Skin: Negative for rash.  Neurological: Negative for dizziness.       Objective:   Physical Exam  Constitutional: He appears well-developed and well-nourished. No distress.  Eyes: No scleral icterus.  Genitourinary: Penis normal.  Musculoskeletal: He exhibits no edema.  Skin: Skin is warm and dry. No rash noted.          Assessment & Plan:

## 2014-05-25 NOTE — Assessment & Plan Note (Signed)
Supportive care. 

## 2014-06-17 ENCOUNTER — Encounter: Payer: Self-pay | Admitting: *Deleted

## 2014-06-17 NOTE — Progress Notes (Signed)
Patient ID: Todd Carroll, male   DOB: 07/26/1976, 38 y.o.   MRN: [id number] Walk-in to [facility].  Pt c/o of left cheek numbness x1 days.  He says it feels like it does when he has been to the dentist and had a cavity fixed.  He has been using salt water rinses to his mouth.  No other complaints of other body parts having numbness or movement problems or strength.  Speech is clear.  Facial movement symmetrical.  Pt to continue to monitor the numbness. Pt also asked about his referral to PCP at [facility].  RN called [facility].  Appt made for the pt.  July 23, 2014 @ 3:45PM with the NP.  Phone call to pt to notify him of this appt.  Message left with appt details. Pt also asked to speak with the RW/ADAP Counselor to start renewal process.  Carver Fila met with the pt.Marland Kitchen

## 2014-07-19 ENCOUNTER — Telehealth: Payer: Self-pay | Admitting: *Deleted

## 2014-07-19 DIAGNOSIS — B20 Human immunodeficiency virus [HIV] disease: Secondary | ICD-10-CM

## 2014-07-19 MED ORDER — ELVITEG-COBIC-EMTRICIT-TENOFDF 150-150-200-300 MG PO TABS: ORAL_TABLET | ORAL | Status: DC

## 2014-07-19 NOTE — Telephone Encounter (Signed)
ADAP Application 

## 2014-07-23 ENCOUNTER — Ambulatory Visit (INDEPENDENT_AMBULATORY_CARE_PROVIDER_SITE_OTHER): Payer: Self-pay | Admitting: Family Medicine

## 2014-07-23 ENCOUNTER — Telehealth: Payer: Self-pay

## 2014-07-23 ENCOUNTER — Encounter: Payer: Self-pay | Admitting: Family Medicine

## 2014-07-23 VITALS — BP 131/87 | HR 111 | Temp 98.5°F | Resp 14 | Ht 73.0 in | Wt 228.0 lb

## 2014-07-23 DIAGNOSIS — K219 Gastro-esophageal reflux disease without esophagitis: Secondary | ICD-10-CM

## 2014-07-23 DIAGNOSIS — L299 Pruritus, unspecified: Secondary | ICD-10-CM

## 2014-07-23 DIAGNOSIS — B20 Human immunodeficiency virus [HIV] disease: Secondary | ICD-10-CM

## 2014-07-23 NOTE — Telephone Encounter (Signed)
Patient seen in office today. 

## 2014-07-23 NOTE — Progress Notes (Signed)
Subjective:    Patient ID: Todd Carroll, male    DOB: [birth year], 38 y.o.   MRN: [id number]  HPI  [name] presents to establish . He states that he is followed by [facility] for HIV and has been followed there for the last several years.    [name] was previously diagnosed with HIV infection.  [name]'s health is reported to have been satisfactory since last evaluation by Dr. Luciana Axe.   He is complaining of an itchy scalp for the past several months. He states that he washes scalp weekly, yet it still itches primarily at night. He has not attempted any OTC interventions to alleviate itching. He reports that his nephew had a slumber party at his residence and one of the children had lice. He denies red eruptions to hair, scalp, or hairline, tickling sensation to scalp, or small, white objects in hair. He state that he has treated bed linens and pillows for lice.       Past Medical History  Diagnosis Date  . ALLERGIC RHINITIS 08/27/2007  . GERD 05/04/2008   Review of Systems  Constitutional: Negative.   HENT: Negative.   Eyes: Negative.   Respiratory: Negative.   Cardiovascular: Negative.   Gastrointestinal: Negative.   Genitourinary: Negative.   Skin: Negative.        Patient is complaining of an itching scalp periodically  Allergic/Immunologic: Positive for immunocompromised state.  Neurological: Negative.   Hematological: Negative.   Psychiatric/Behavioral: Negative.        Objective:   Physical Exam  Constitutional: He is oriented to person, place, and time. He appears well-developed.  HENT:  Head: Normocephalic.  Right Ear: External ear normal.  Mouth/Throat: Oropharynx is clear and moist.  Eyes: Conjunctivae are normal. Pupils are equal, round, and reactive to light.  Neck: Normal range of motion. Neck supple.  Cardiovascular: Normal rate, regular rhythm and normal heart sounds.   Pulmonary/Chest: Effort normal and breath sounds normal.  Abdominal:  Soft. Bowel sounds are normal.  Musculoskeletal: Normal range of motion.  Neurological: He is alert and oriented to person, place, and time. He has normal reflexes.  Skin: Skin is warm, dry and intact.  Psychiatric: He has a normal mood and affect. His behavior is normal. Judgment and thought content normal.       BP 131/87  Pulse 111  Temp(Src) 98.5 F (36.9 C)  Resp 14  Ht 6\' 1"  (1.854 m)  Wt 228 lb (103.42 kg)  BMI 30.09 kg/m2 Assessment & Plan:   1. Human immunodeficiency virus (HIV) disease Follow up with Dr. Luciana Axe as scheduled for labs. Reviewed previous labs.   2. Gastroesophageal reflux disease without esophagitis Continue Omeprazole. Discussed GERD diet. Provided written information on diet and interventions  3. Scalp pruritus Continue washing hair weekly and apply cocconut oil which has improved dryness to scalp 6 months physical examinations.   4. Preventative care Vaccinations: Mr. [name] is up to date Patient is not sexually active at this time.  Last dental visit 1 month ago   RTC: 6 months for CPE with Dr. Wynona Canes, FNP

## 2014-07-23 NOTE — Patient Instructions (Signed)
Heartburn  Heartburn is a painful, burning sensation in the chest. It may feel worse in certain positions, such as lying down or bending over. It is caused by stomach acid backing up into the tube that carries food from the mouth down to the stomach (lower esophagus).   CAUSES   · Large meals.  · Certain foods and drinks.  · Exercise.  · Increased acid production.  · Being overweight or obese.  · Certain medicines.  SYMPTOMS   · Burning pain in the chest or lower throat.  · Bitter taste in the mouth.  · Coughing.  DIAGNOSIS   If the usual treatments for heartburn do not improve your symptoms, then tests may be done to see if there is another condition present. Possible tests may include:  · X-rays.  · Endoscopy. This is when a tube with a light and a camera on the end is used to examine the esophagus and the stomach.  · A test to measure the amount of acid in the esophagus (pH test).  · A test to see if the esophagus is working properly (esophageal manometry).  · Blood, breath, or stool tests to check for bacteria that cause ulcers.  TREATMENT   · Your caregiver may tell you to use certain over-the-counter medicines (antacids, acid reducers) for mild heartburn.  · Your caregiver may prescribe medicines to decrease the acid in your stomach or protect your stomach lining.  · Your caregiver may recommend certain diet changes.  · For severe cases, your caregiver may recommend that the head of your bed be elevated on blocks. (Sleeping with more pillows is not an effective treatment as it only changes the position of your head and does not improve the main problem of stomach acid refluxing into the esophagus.)  HOME CARE INSTRUCTIONS   · Take all medicines as directed by your caregiver.  · Raise the head of your bed by putting blocks under the legs if instructed to by your caregiver.  · Do not exercise right after eating.  · Avoid eating 2 or 3 hours before bed. Do not lie down right after eating.  · Eat small meals  throughout the day instead of 3 large meals.  · Stop smoking if you smoke.  · Maintain a healthy weight.  · Identify foods and beverages that make your symptoms worse and avoid them. Foods you may want to avoid include:  ¨ Peppers.  ¨ Chocolate.  ¨ High-fat foods, including fried foods.  ¨ Spicy foods.  ¨ Garlic and onions.  ¨ Citrus fruits, including oranges, grapefruit, lemons, and limes.  ¨ Food containing tomatoes or tomato products.  ¨ Mint.  ¨ Carbonated drinks, caffeinated drinks, and alcohol.  ¨ Vinegar.  SEEK IMMEDIATE MEDICAL CARE IF:  · You have severe chest pain that goes down your arm or into your jaw or neck.  · You feel sweaty, dizzy, or lightheaded.  · You are short of breath.  · You vomit blood.  · You have difficulty or pain with swallowing.  · You have bloody or black, tarry stools.  · You have episodes of heartburn more than 3 times a week for more than 2 weeks.  MAKE SURE YOU:  · Understand these instructions.  · Will watch your condition.  · Will get help right away if you are not doing well or get worse.  Document Released: 04/07/2009 Document Revised: 02/11/2012 Document Reviewed: 05/06/2011  ExitCare® Patient Information ©2015 [employer]. This information is   not intended to replace advice given to you by your health care provider. Make sure you discuss any questions you have with your health care provider.

## 2014-07-25 DIAGNOSIS — L299 Pruritus, unspecified: Secondary | ICD-10-CM | POA: Insufficient documentation

## 2014-10-26 ENCOUNTER — Ambulatory Visit: Payer: Self-pay | Admitting: Internal Medicine

## 2014-11-01 ENCOUNTER — Ambulatory Visit (INDEPENDENT_AMBULATORY_CARE_PROVIDER_SITE_OTHER): Payer: Self-pay | Admitting: Infectious Diseases

## 2014-11-01 ENCOUNTER — Other Ambulatory Visit: Payer: Self-pay

## 2014-11-01 ENCOUNTER — Encounter: Payer: Self-pay | Admitting: Infectious Diseases

## 2014-11-01 ENCOUNTER — Ambulatory Visit (INDEPENDENT_AMBULATORY_CARE_PROVIDER_SITE_OTHER): Payer: Self-pay | Admitting: *Deleted

## 2014-11-01 VITALS — BP 141/93 | HR 87 | Temp 98.8°F | Wt 226.0 lb

## 2014-11-01 DIAGNOSIS — M7022 Olecranon bursitis, left elbow: Secondary | ICD-10-CM

## 2014-11-01 DIAGNOSIS — Z23 Encounter for immunization: Secondary | ICD-10-CM

## 2014-11-01 DIAGNOSIS — R361 Hematospermia: Secondary | ICD-10-CM | POA: Insufficient documentation

## 2014-11-01 DIAGNOSIS — B20 Human immunodeficiency virus [HIV] disease: Secondary | ICD-10-CM

## 2014-11-01 DIAGNOSIS — Z113 Encounter for screening for infections with a predominantly sexual mode of transmission: Secondary | ICD-10-CM

## 2014-11-01 DIAGNOSIS — M7030 Other bursitis of elbow, unspecified elbow: Secondary | ICD-10-CM | POA: Insufficient documentation

## 2014-11-01 MED ORDER — CEPHALEXIN 500 MG PO CAPS: 500.0000 mg | ORAL_CAPSULE | Freq: Two times a day (BID) | ORAL | Status: DC

## 2014-11-01 NOTE — Progress Notes (Signed)
   Subjective:    Patient ID: Steffanie RainwaterChristopher M Mundt, male    DOB: 01-27-1976, 38 y.o.   MRN: [id number]  HPI 38 yo M with hx of HIV+, on stribild.  C/o L elbow pain and swelling. No trauma. Does not work. The swelling has improved but he still has pain. Pin point on ulnarl head.  He also complains of blood in his semen. Has noted darker urine.   HIV 1 RNA QUANT (copies/mL)  Date Value  04/07/2014 <20  10/23/2013 <20  05/19/2013 <20   CD4 T CELL ABS  Date Value  04/07/2014 1340 /uL  11/30/2013 760 /uL  05/19/2013 1090 cmm    Review of Systems  Constitutional: Negative for fever and chills.  Genitourinary: Negative for dysuria.      Objective:   Physical Exam  Constitutional: He appears well-developed and well-nourished.  Genitourinary: Testes normal. Right testis shows no mass, no swelling and no tenderness. Right testis is descended. Left testis shows no mass, no swelling and no tenderness. Left testis is descended.  Musculoskeletal:       Arms:         Assessment & Plan:

## 2014-11-01 NOTE — Assessment & Plan Note (Signed)
Encouraged him to continue taking ibuprofen. He asks for anbx, will give him 10 days of keflex.

## 2014-11-01 NOTE — Assessment & Plan Note (Signed)
Appears to be doing well. Offered/refused condoms. Will f/u per routine. Labs today. Has gotten flu shot.

## 2014-11-01 NOTE — Assessment & Plan Note (Signed)
Std screening, UA, UCx. Consider testicular u/s if other labs normal.

## 2014-11-02 ENCOUNTER — Other Ambulatory Visit: Payer: Self-pay | Admitting: Infectious Diseases

## 2014-11-02 ENCOUNTER — Other Ambulatory Visit (INDEPENDENT_AMBULATORY_CARE_PROVIDER_SITE_OTHER): Payer: Self-pay

## 2014-11-02 DIAGNOSIS — B20 Human immunodeficiency virus [HIV] disease: Secondary | ICD-10-CM

## 2014-11-02 DIAGNOSIS — Z79899 Other long term (current) drug therapy: Secondary | ICD-10-CM

## 2014-11-02 LAB — COMPREHENSIVE METABOLIC PANEL
ALBUMIN: 4.1 g/dL (ref 3.5–5.2)
ALK PHOS: 97 U/L (ref 39–117)
ALT: 14 U/L (ref 0–53)
AST: 17 U/L (ref 0–37)
BUN: 10 mg/dL (ref 6–23)
CO2: 25 mEq/L (ref 19–32)
Calcium: 8.9 mg/dL (ref 8.4–10.5)
Chloride: 103 mEq/L (ref 96–112)
Creat: 1.13 mg/dL (ref 0.50–1.35)
Glucose, Bld: 122 mg/dL — ABNORMAL HIGH (ref 70–99)
POTASSIUM: 4.1 meq/L (ref 3.5–5.3)
SODIUM: 140 meq/L (ref 135–145)
TOTAL PROTEIN: 7 g/dL (ref 6.0–8.3)
Total Bilirubin: 0.3 mg/dL (ref 0.2–1.2)

## 2014-11-02 LAB — URINALYSIS, ROUTINE W REFLEX MICROSCOPIC
Bilirubin Urine: NEGATIVE
GLUCOSE, UA: NEGATIVE mg/dL
Hgb urine dipstick: NEGATIVE
Ketones, ur: NEGATIVE mg/dL
LEUKOCYTES UA: NEGATIVE
Nitrite: NEGATIVE
PROTEIN: NEGATIVE mg/dL
Specific Gravity, Urine: 1.019 (ref 1.005–1.030)
UROBILINOGEN UA: 0.2 mg/dL (ref 0.0–1.0)
pH: 6 (ref 5.0–8.0)

## 2014-11-02 LAB — CBC WITH DIFFERENTIAL/PLATELET
BASOS ABS: 0 10*3/uL (ref 0.0–0.1)
Basophils Relative: 0 % (ref 0–1)
EOS ABS: 0.2 10*3/uL (ref 0.0–0.7)
EOS PCT: 3 % (ref 0–5)
HCT: 38.2 % — ABNORMAL LOW (ref 39.0–52.0)
Hemoglobin: 12.8 g/dL — ABNORMAL LOW (ref 13.0–17.0)
Lymphocytes Relative: 35 % (ref 12–46)
Lymphs Abs: 2.1 10*3/uL (ref 0.7–4.0)
MCH: 29.6 pg (ref 26.0–34.0)
MCHC: 33.5 g/dL (ref 30.0–36.0)
MCV: 88.2 fL (ref 78.0–100.0)
MPV: 9.5 fL (ref 9.4–12.4)
Monocytes Absolute: 0.5 10*3/uL (ref 0.1–1.0)
Monocytes Relative: 8 % (ref 3–12)
Neutro Abs: 3.2 10*3/uL (ref 1.7–7.7)
Neutrophils Relative %: 54 % (ref 43–77)
PLATELETS: 398 10*3/uL (ref 150–400)
RBC: 4.33 MIL/uL (ref 4.22–5.81)
RDW: 14.7 % (ref 11.5–15.5)
WBC: 6 10*3/uL (ref 4.0–10.5)

## 2014-11-02 LAB — LIPID PANEL
CHOL/HDL RATIO: 3.7 ratio
CHOLESTEROL: 188 mg/dL (ref 0–200)
HDL: 51 mg/dL (ref >39)
LDL Cholesterol: 104 mg/dL — ABNORMAL HIGH (ref 0–99)
TRIGLYCERIDES: 164 mg/dL — AB (ref <150)
VLDL: 33 mg/dL (ref 0–40)

## 2014-11-02 LAB — RPR

## 2014-11-03 LAB — HIV-1 RNA QUANT-NO REFLEX-BLD
HIV 1 RNA Quant: <20 copies/mL (ref <20)
HIV-1 RNA Quant, Log: <1.3 {Log} (ref <1.30)

## 2014-11-03 LAB — URINE CULTURE
Colony Count: NO GROWTH
Organism ID, Bacteria: NO GROWTH

## 2014-11-03 LAB — URINE CYTOLOGY ANCILLARY ONLY
Chlamydia: NEGATIVE
NEISSERIA GONORRHEA: NEGATIVE

## 2014-11-03 LAB — T-HELPER CELL (CD4) - (RCID CLINIC ONLY)
CD4 % Helper T Cell: 36 % (ref 33–55)
CD4 T Cell Abs: 800 /uL (ref 400–2700)

## 2014-11-04 ENCOUNTER — Ambulatory Visit: Payer: Self-pay | Admitting: Family Medicine

## 2014-11-11 ENCOUNTER — Telehealth: Payer: Self-pay | Admitting: *Deleted

## 2014-11-11 NOTE — Telephone Encounter (Signed)
Shared lab results.  Discussed Triglycerides and that they had dropped.   Pt stated that he was paying attention to carbohydrate/starch consumption.  RN praised pt for his dietary attention.

## 2014-11-23 ENCOUNTER — Other Ambulatory Visit: Payer: Self-pay | Admitting: Internal Medicine

## 2014-11-23 ENCOUNTER — Telehealth: Payer: Self-pay | Admitting: Family Medicine

## 2014-11-23 DIAGNOSIS — B20 Human immunodeficiency virus [HIV] disease: Secondary | ICD-10-CM

## 2014-12-07 ENCOUNTER — Telehealth: Payer: Self-pay | Admitting: Family Medicine

## 2014-12-07 NOTE — Telephone Encounter (Signed)
Patient called to request RX be changed to Nexium oral suspension packets to qualify for free [employer] [facility]. Please call patient once RX has been changed.

## 2014-12-07 NOTE — Telephone Encounter (Signed)
Todd Carroll,  Can this be changed to oral suspension packets? Please advise. Thanks!

## 2014-12-09 ENCOUNTER — Other Ambulatory Visit: Payer: Self-pay | Admitting: Family Medicine

## 2014-12-09 DIAGNOSIS — K219 Gastro-esophageal reflux disease without esophagitis: Secondary | ICD-10-CM

## 2014-12-09 MED ORDER — ESOMEPRAZOLE MAGNESIUM 40 MG PO PACK: 40.0000 mg | PACK | Freq: Every day | ORAL | Status: DC

## 2014-12-28 ENCOUNTER — Ambulatory Visit: Payer: Self-pay

## 2014-12-29 ENCOUNTER — Other Ambulatory Visit: Payer: Self-pay | Admitting: *Deleted

## 2014-12-29 DIAGNOSIS — B20 Human immunodeficiency virus [HIV] disease: Secondary | ICD-10-CM

## 2014-12-29 MED ORDER — ELVITEG-COBIC-EMTRICIT-TENOFDF 150-150-200-300 MG PO TABS: 1.0000 | ORAL_TABLET | Freq: Every day | ORAL | Status: DC

## 2015-02-15 ENCOUNTER — Other Ambulatory Visit: Payer: Self-pay

## 2015-03-01 ENCOUNTER — Ambulatory Visit: Payer: Self-pay | Admitting: Internal Medicine

## 2015-03-24 ENCOUNTER — Other Ambulatory Visit (INDEPENDENT_AMBULATORY_CARE_PROVIDER_SITE_OTHER): Payer: Self-pay

## 2015-03-24 DIAGNOSIS — B20 Human immunodeficiency virus [HIV] disease: Secondary | ICD-10-CM

## 2015-03-24 LAB — COMPLETE METABOLIC PANEL WITH GFR
ALK PHOS: 99 U/L (ref 39–117)
ALT: 11 U/L (ref 0–53)
AST: 17 U/L (ref 0–37)
Albumin: 3.9 g/dL (ref 3.5–5.2)
BUN: 8 mg/dL (ref 6–23)
CALCIUM: 8.8 mg/dL (ref 8.4–10.5)
CHLORIDE: 104 meq/L (ref 96–112)
CO2: 28 mEq/L (ref 19–32)
Creat: 1.13 mg/dL (ref 0.50–1.35)
GFR, Est African American: >89 mL/min
GFR, Est Non African American: 82 mL/min
Glucose, Bld: 92 mg/dL (ref 70–99)
POTASSIUM: 4 meq/L (ref 3.5–5.3)
Sodium: 139 mEq/L (ref 135–145)
Total Bilirubin: 0.5 mg/dL (ref 0.2–1.2)
Total Protein: 7 g/dL (ref 6.0–8.3)

## 2015-03-24 LAB — CBC WITH DIFFERENTIAL/PLATELET
BASOS PCT: 0 % (ref 0–1)
Basophils Absolute: 0 10*3/uL (ref 0.0–0.1)
EOS ABS: 0.1 10*3/uL (ref 0.0–0.7)
Eosinophils Relative: 2 % (ref 0–5)
HCT: 40.9 % (ref 39.0–52.0)
Hemoglobin: 13.9 g/dL (ref 13.0–17.0)
Lymphocytes Relative: 48 % — ABNORMAL HIGH (ref 12–46)
Lymphs Abs: 2.8 10*3/uL (ref 0.7–4.0)
MCH: 29.6 pg (ref 26.0–34.0)
MCHC: 34 g/dL (ref 30.0–36.0)
MCV: 87.2 fL (ref 78.0–100.0)
MPV: 9.5 fL (ref 8.6–12.4)
Monocytes Absolute: 0.5 10*3/uL (ref 0.1–1.0)
Monocytes Relative: 9 % (ref 3–12)
NEUTROS PCT: 41 % — AB (ref 43–77)
Neutro Abs: 2.4 10*3/uL (ref 1.7–7.7)
PLATELETS: 401 10*3/uL — AB (ref 150–400)
RBC: 4.69 MIL/uL (ref 4.22–5.81)
RDW: 15 % (ref 11.5–15.5)
WBC: 5.8 10*3/uL (ref 4.0–10.5)

## 2015-03-25 LAB — T-HELPER CELL (CD4) - (RCID CLINIC ONLY)
CD4 % Helper T Cell: 35 % (ref 33–55)
CD4 T CELL ABS: 1090 /uL (ref 400–2700)

## 2015-03-26 LAB — HIV-1 RNA QUANT-NO REFLEX-BLD: HIV 1 RNA Quant: <20 copies/mL (ref <20)

## 2015-03-29 ENCOUNTER — Ambulatory Visit: Payer: Self-pay | Admitting: Internal Medicine

## 2015-04-07 ENCOUNTER — Other Ambulatory Visit: Payer: Self-pay | Admitting: Internal Medicine

## 2015-04-12 ENCOUNTER — Ambulatory Visit: Payer: Self-pay | Admitting: Internal Medicine

## 2015-05-12 ENCOUNTER — Ambulatory Visit: Payer: Self-pay | Admitting: Internal Medicine

## 2015-06-16 ENCOUNTER — Other Ambulatory Visit: Payer: Self-pay | Admitting: *Deleted

## 2015-06-16 DIAGNOSIS — B20 Human immunodeficiency virus [HIV] disease: Secondary | ICD-10-CM

## 2015-06-16 DIAGNOSIS — Z113 Encounter for screening for infections with a predominantly sexual mode of transmission: Secondary | ICD-10-CM

## 2015-07-26 ENCOUNTER — Other Ambulatory Visit: Payer: Self-pay | Admitting: Internal Medicine

## 2015-07-26 DIAGNOSIS — B20 Human immunodeficiency virus [HIV] disease: Secondary | ICD-10-CM

## 2015-07-27 ENCOUNTER — Ambulatory Visit: Payer: Self-pay

## 2015-07-27 ENCOUNTER — Telehealth: Payer: Self-pay | Admitting: *Deleted

## 2015-07-27 ENCOUNTER — Other Ambulatory Visit (INDEPENDENT_AMBULATORY_CARE_PROVIDER_SITE_OTHER): Payer: Self-pay

## 2015-07-27 DIAGNOSIS — B20 Human immunodeficiency virus [HIV] disease: Secondary | ICD-10-CM

## 2015-07-27 DIAGNOSIS — Z113 Encounter for screening for infections with a predominantly sexual mode of transmission: Secondary | ICD-10-CM

## 2015-07-27 LAB — COMPLETE METABOLIC PANEL WITH GFR
ALBUMIN: 4.4 g/dL (ref 3.6–5.1)
ALK PHOS: 92 U/L (ref 40–115)
ALT: 16 U/L (ref 9–46)
AST: 15 U/L (ref 10–40)
BUN: 13 mg/dL (ref 7–25)
CHLORIDE: 102 mmol/L (ref 98–110)
CO2: 25 mmol/L (ref 20–31)
Calcium: 9.3 mg/dL (ref 8.6–10.3)
Creat: 0.96 mg/dL (ref 0.60–1.35)
GFR, Est African American: >89 mL/min (ref >=60)
GFR, Est Non African American: >89 mL/min (ref >=60)
GLUCOSE: 96 mg/dL (ref 65–99)
POTASSIUM: 4.4 mmol/L (ref 3.5–5.3)
Sodium: 138 mmol/L (ref 135–146)
Total Bilirubin: 0.2 mg/dL (ref 0.2–1.2)
Total Protein: 7.2 g/dL (ref 6.1–8.1)

## 2015-07-27 LAB — CBC WITH DIFFERENTIAL/PLATELET
Basophils Absolute: 0 10*3/uL (ref 0.0–0.1)
Basophils Relative: 0 % (ref 0–1)
EOS ABS: 0.2 10*3/uL (ref 0.0–0.7)
EOS PCT: 2 % (ref 0–5)
HCT: 40.8 % (ref 39.0–52.0)
Hemoglobin: 13.4 g/dL (ref 13.0–17.0)
Lymphocytes Relative: 37 % (ref 12–46)
Lymphs Abs: 3.8 10*3/uL (ref 0.7–4.0)
MCH: 29.6 pg (ref 26.0–34.0)
MCHC: 32.8 g/dL (ref 30.0–36.0)
MCV: 90.3 fL (ref 78.0–100.0)
MONOS PCT: 7 % (ref 3–12)
MPV: 9.6 fL (ref 8.6–12.4)
Monocytes Absolute: 0.7 10*3/uL (ref 0.1–1.0)
Neutro Abs: 5.5 10*3/uL (ref 1.7–7.7)
Neutrophils Relative %: 54 % (ref 43–77)
Platelets: 397 10*3/uL (ref 150–400)
RBC: 4.52 MIL/uL (ref 4.22–5.81)
RDW: 14.4 % (ref 11.5–15.5)
WBC: 10.2 10*3/uL (ref 4.0–10.5)

## 2015-07-27 NOTE — Telephone Encounter (Signed)
Patient requesting refill.  Last seen 10/2014. RN authorized 2 months of refills, contacted patient.  RN gave him lab/follow up appointments, advised him that he must keep this so that he can continue his refills.  Patient needs to renew RW/ADAP, given appointment today. He verbalized understanding.

## 2015-07-28 LAB — T-HELPER CELL (CD4) - (RCID CLINIC ONLY)
CD4 % Helper T Cell: 39 % (ref 33–55)
CD4 T Cell Abs: 1440 /uL (ref 400–2700)

## 2015-07-28 LAB — URINE CYTOLOGY ANCILLARY ONLY
Chlamydia: NEGATIVE
Neisseria Gonorrhea: NEGATIVE

## 2015-07-28 LAB — RPR

## 2015-07-29 LAB — HIV-1 RNA QUANT-NO REFLEX-BLD

## 2015-08-12 ENCOUNTER — Other Ambulatory Visit: Payer: Self-pay | Admitting: *Deleted

## 2015-08-12 DIAGNOSIS — B20 Human immunodeficiency virus [HIV] disease: Secondary | ICD-10-CM

## 2015-08-12 MED ORDER — ELVITEG-COBIC-EMTRICIT-TENOFDF 150-150-200-300 MG PO TABS: 1.0000 | ORAL_TABLET | Freq: Every day | ORAL | Status: DC

## 2015-08-16 ENCOUNTER — Ambulatory Visit: Payer: Self-pay | Admitting: Internal Medicine

## 2015-09-29 ENCOUNTER — Ambulatory Visit: Payer: Self-pay | Admitting: Internal Medicine

## 2015-10-06 ENCOUNTER — Other Ambulatory Visit: Payer: Self-pay | Admitting: Internal Medicine

## 2015-11-02 ENCOUNTER — Other Ambulatory Visit: Payer: Self-pay | Admitting: *Deleted

## 2015-11-02 DIAGNOSIS — B20 Human immunodeficiency virus [HIV] disease: Secondary | ICD-10-CM

## 2015-11-02 MED ORDER — ELVITEG-COBIC-EMTRICIT-TENOFDF 150-150-200-300 MG PO TABS: 1.0000 | ORAL_TABLET | Freq: Every day | ORAL | Status: DC

## 2015-11-02 NOTE — Telephone Encounter (Signed)
Last refill allowed, last OV 11/01/14, has appt 11/14/15.

## 2015-11-14 ENCOUNTER — Ambulatory Visit (INDEPENDENT_AMBULATORY_CARE_PROVIDER_SITE_OTHER): Payer: Self-pay | Admitting: Internal Medicine

## 2015-11-14 ENCOUNTER — Encounter: Payer: Self-pay | Admitting: Internal Medicine

## 2015-11-14 ENCOUNTER — Telehealth: Payer: Self-pay | Admitting: *Deleted

## 2015-11-14 VITALS — BP 114/78 | HR 85 | Temp 98.7°F | Wt 250.0 lb

## 2015-11-14 DIAGNOSIS — R058 Other specified cough: Secondary | ICD-10-CM

## 2015-11-14 DIAGNOSIS — Z23 Encounter for immunization: Secondary | ICD-10-CM

## 2015-11-14 DIAGNOSIS — B20 Human immunodeficiency virus [HIV] disease: Secondary | ICD-10-CM

## 2015-11-14 DIAGNOSIS — L290 Pruritus ani: Secondary | ICD-10-CM

## 2015-11-14 DIAGNOSIS — R05 Cough: Secondary | ICD-10-CM

## 2015-11-14 MED ORDER — ALBENDAZOLE 200 MG PO TABS
400.0000 mg | ORAL_TABLET | Freq: Two times a day (BID) | ORAL | Status: DC
Start: 2015-11-14 — End: 2015-12-01

## 2015-11-14 MED ORDER — ESOMEPRAZOLE MAGNESIUM 20 MG PO CPDR: 20.0000 mg | DELAYED_RELEASE_CAPSULE | Freq: Every day | ORAL | Status: DC

## 2015-11-14 NOTE — Progress Notes (Signed)
Patient ID: Todd Carroll, male   DOB: Feb 20, 1976, 39 y.o.   MRN: [id number]      RFV: routine follow up  Patient ID: Todd Rainwaterhristopher M Carroll, male   DOB: Feb 20, 1976, 39 y.o.   MRN: [id number]  HPI Todd Carroll is a 39yo M with HIV disease, Cd 4 count of 1440/VL<20, on stribild. He states that he is doing well overall except that he has noticed having new onset of dry cough in the las 1-2 wk. No other symptoms of sinus congestion, post nasal drip, productive cough, fevers, chills or nightsweats  Outpatient Encounter Prescriptions as of 11/14/2015  Medication Sig  . elvitegravir-cobicistat-emtricitabine-tenofovir (STRIBILD) 150-150-200-300 MG TABS tablet Take 1 tablet by mouth daily with breakfast.  . esomeprazole (NEXIUM) 40 MG packet Take 40 mg by mouth daily before breakfast.  . mometasone (NASONEX) 50 MCG/ACT nasal spray Place 2 sprays into the nose daily.  . [DISCONTINUED] cephALEXin (KEFLEX) 500 MG capsule Take 1 capsule (500 mg total) by mouth 2 (two) times daily.  . [DISCONTINUED] esomeprazole (NEXIUM) 40 MG capsule Take 1 capsule (40 mg total) by mouth daily as needed.   No facility-administered encounter medications on file as of 11/14/2015.     Patient Active Problem List   Diagnosis Date Noted  . Bursitis of elbow 11/01/2014  . 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 Scalp pruritus 07/25/2014  . Itch 05/25/2014  . Rash and nonspecific skin eruption 01/05/2014  . Folliculitis 07/13/2013  . Pain, dental 07/13/2013  . Dysuria 04/29/2013  . Human immunodeficiency virus (HIV) disease (HCC) 02/27/2013  . GERD 05/04/2008  . ALLERGIC RHINITIS 08/27/2007     Health Maintenance Due  Topic Date Due  . INFLUENZA VACCINE  07/04/2015     Review of Systems 10 point reviewed, positive pertinents listed in hpi. He mentions having anal itching Physical Exam   BP 114/78 mmHg  Pulse 85  Temp(Src) 98.7 F (37.1 C) (Oral)  Wt 250 lb (113.399 kg) Constitutional: He is oriented to person,  place, and time. He appears well-developed and well-nourished. No distress.  HENT:  Mouth/Throat: Oropharynx is clear and moist. No oropharyngeal exudate.  Cardiovascular: Normal rate, regular rhythm and normal heart sounds. Exam reveals no gallop and no friction rub.  No murmur heard.  Pulmonary/Chest: Effort normal and breath sounds normal. No respiratory distress. He has no wheezes.  Gu: anus, no lesions or condyloma Lymphadenopathy:  He has no cervical adenopathy.  Neurological: He is alert and oriented to person, place, and time.  Skin: Skin is warm and dry. No rash noted. No erythema.  Psychiatric: He has a normal mood and affect. His behavior is normal.     Lab Results  Component Value Date   CD4TCELL 39 07/27/2015   Lab Results  Component Value Date   CD4TABS 1440 07/27/2015   CD4TABS 1090 03/24/2015   CD4TABS 800 11/02/2014   Lab Results  Component Value Date   HIV1RNAQUANT <20 07/27/2015   Lab Results  Component Value Date   HEPBSAB NONREACTIVE 03/19/2013    CBC Lab Results  Component Value Date   WBC 10.2 07/27/2015   RBC 4.52 07/27/2015   HGB 13.4 07/27/2015   HCT 40.8 07/27/2015   PLT 397 07/27/2015   MCV 90.3 07/27/2015   MCH 29.6 07/27/2015   MCHC 32.8 07/27/2015   RDW 14.4 07/27/2015   LYMPHSABS 3.8 07/27/2015   MONOABS 0.7 07/27/2015   EOSABS 0.2 07/27/2015   BASOSABS 0.0 07/27/2015   BMET Lab Results  Component Value Date  NA 138 07/27/2015   K 4.4 07/27/2015   CL 102 07/27/2015   CO2 25 07/27/2015   GLUCOSE 96 07/27/2015   BUN 13 07/27/2015   CREATININE 0.96 07/27/2015   CALCIUM 9.3 07/27/2015   GFRNONAA >89 07/27/2015   GFRAA >89 07/27/2015     Assessment and Plan  Dry cough = has no other symptoms to suggest viral/bacterila infetion. He states he notices mostly at night, coincides with colder weather/increase heater. will try humidifier. Still maybe viral, which would be self limiting  hiv disease = continue on  stribild  Anal pruritus = will recommend zinc oxide, better hygiene to see if it helps. Will do sti testing.Can consider o x p, tape to see if pinworm.   Health maintenance = received flu vaccine today. Screened rectal and throat for gc. Though he reports no recent sex

## 2015-11-14 NOTE — Telephone Encounter (Signed)
Patient called stating the Rxs he was given today are not covered by ADAP. One medication is $800. Will have him check with Denny Peonrin for patient assistance. Medications are Nexium and Albenza. Wendall MolaJacqueline Bond Grieshop

## 2015-11-15 DIAGNOSIS — Z23 Encounter for immunization: Secondary | ICD-10-CM

## 2015-11-15 LAB — CYTOLOGY, (ORAL, ANAL, URETHRAL) ANCILLARY ONLY
CHLAMYDIA, DNA PROBE: NEGATIVE
Chlamydia: NEGATIVE
NEISSERIA GONORRHEA: NEGATIVE
NEISSERIA GONORRHEA: NEGATIVE

## 2015-11-17 NOTE — Telephone Encounter (Signed)
Patient calling to see if there are alternative medications (patient has ADAP).  Please advise.

## 2015-11-18 ENCOUNTER — Other Ambulatory Visit: Payer: Self-pay | Admitting: *Deleted

## 2015-11-18 MED ORDER — OMEPRAZOLE 20 MG PO CPDR: 20.0000 mg | DELAYED_RELEASE_CAPSULE | Freq: Every day | ORAL | Status: DC

## 2015-11-18 NOTE — Telephone Encounter (Signed)
Per Dr. Drue SecondSnider change Nexium to Omeprazole 20 mg daily. This should be covered by ADAP and she will check for an alternative to the Albenza. Wendall MolaJacqueline Desarae Placide

## 2015-11-22 ENCOUNTER — Telehealth: Payer: Self-pay

## 2015-11-22 NOTE — Telephone Encounter (Signed)
Left message for patient to call the office and schedule an appointment to renew ADAP benefits.

## 2015-12-07 ENCOUNTER — Telehealth: Payer: Self-pay | Admitting: *Deleted

## 2015-12-07 NOTE — Telephone Encounter (Signed)
-----   Message from Judyann Munsonynthia Snider, MD sent at 12/01/2015  2:36 PM EST ----- Can you still see if he is having anal itching? If so, can you recommend him to try zinc oxide (aka diaper rash cream) to see if relieves symptoms x 7 d. Better hygiene. If still worse, can ask him to do o x p. thanks

## 2015-12-07 NOTE — Telephone Encounter (Signed)
Called the patient to see if her was still having itching and irritation. He advised he is and that he will be here soon to renew his ADAP and will have labs at that time. Advised he does not have any money so if ADAP does not cover it he can not afford it. He also advised he forgot what she recommended for his face and wants to know what that was. Advised not mentioned in the note so will ask the doctor if she remembers what it was. Advised him will let the doctor know.

## 2015-12-20 ENCOUNTER — Ambulatory Visit: Payer: Self-pay

## 2015-12-30 ENCOUNTER — Ambulatory Visit (INDEPENDENT_AMBULATORY_CARE_PROVIDER_SITE_OTHER): Payer: Self-pay | Admitting: Family Medicine

## 2015-12-30 ENCOUNTER — Other Ambulatory Visit: Payer: Self-pay | Admitting: Family Medicine

## 2015-12-30 VITALS — BP 133/94 | HR 77 | Temp 98.3°F | Resp 16 | Ht 73.0 in | Wt 249.0 lb

## 2015-12-30 DIAGNOSIS — R197 Diarrhea, unspecified: Secondary | ICD-10-CM

## 2015-12-30 DIAGNOSIS — R1084 Generalized abdominal pain: Secondary | ICD-10-CM

## 2015-12-30 DIAGNOSIS — K625 Hemorrhage of anus and rectum: Secondary | ICD-10-CM

## 2015-12-30 DIAGNOSIS — B20 Human immunodeficiency virus [HIV] disease: Secondary | ICD-10-CM

## 2015-12-30 DIAGNOSIS — R300 Dysuria: Secondary | ICD-10-CM

## 2015-12-30 DIAGNOSIS — R631 Polydipsia: Secondary | ICD-10-CM

## 2015-12-30 DIAGNOSIS — R358 Other polyuria: Secondary | ICD-10-CM

## 2015-12-30 DIAGNOSIS — R3589 Other polyuria: Secondary | ICD-10-CM

## 2015-12-30 LAB — COMPLETE METABOLIC PANEL WITH GFR
ALT: 20 U/L (ref 9–46)
AST: 18 U/L (ref 10–40)
Albumin: 4.3 g/dL (ref 3.6–5.1)
Alkaline Phosphatase: 100 U/L (ref 40–115)
BILIRUBIN TOTAL: 0.5 mg/dL (ref 0.2–1.2)
BUN: 10 mg/dL (ref 7–25)
CHLORIDE: 101 mmol/L (ref 98–110)
CO2: 27 mmol/L (ref 20–31)
Calcium: 9 mg/dL (ref 8.6–10.3)
Creat: 1.63 mg/dL — ABNORMAL HIGH (ref 0.60–1.35)
GFR, EST AFRICAN AMERICAN: 60 mL/min (ref >=60)
GFR, EST NON AFRICAN AMERICAN: 52 mL/min — AB (ref >=60)
Glucose, Bld: 95 mg/dL (ref 65–99)
POTASSIUM: 4 mmol/L (ref 3.5–5.3)
Sodium: 137 mmol/L (ref 135–146)
TOTAL PROTEIN: 7.4 g/dL (ref 6.1–8.1)

## 2015-12-30 LAB — CBC WITH DIFFERENTIAL/PLATELET
Basophils Absolute: 0 10*3/uL (ref 0.0–0.1)
Basophils Relative: 0 % (ref 0–1)
EOS ABS: 0.1 10*3/uL (ref 0.0–0.7)
EOS PCT: 2 % (ref 0–5)
HCT: 42.6 % (ref 39.0–52.0)
Hemoglobin: 14.4 g/dL (ref 13.0–17.0)
LYMPHS ABS: 2.7 10*3/uL (ref 0.7–4.0)
Lymphocytes Relative: 38 % (ref 12–46)
MCH: 30.4 pg (ref 26.0–34.0)
MCHC: 33.8 g/dL (ref 30.0–36.0)
MCV: 90.1 fL (ref 78.0–100.0)
MONO ABS: 0.8 10*3/uL (ref 0.1–1.0)
MONOS PCT: 11 % (ref 3–12)
MPV: 9.2 fL (ref 8.6–12.4)
Neutro Abs: 3.5 10*3/uL (ref 1.7–7.7)
Neutrophils Relative %: 49 % (ref 43–77)
PLATELETS: 347 10*3/uL (ref 150–400)
RBC: 4.73 MIL/uL (ref 4.22–5.81)
RDW: 14.4 % (ref 11.5–15.5)
WBC: 7.2 10*3/uL (ref 4.0–10.5)

## 2015-12-30 LAB — POCT URINALYSIS DIP (DEVICE)
BILIRUBIN URINE: NEGATIVE
GLUCOSE, UA: NEGATIVE mg/dL
KETONES UR: NEGATIVE mg/dL
LEUKOCYTES UA: NEGATIVE
Nitrite: NEGATIVE
Protein, ur: NEGATIVE mg/dL
Specific Gravity, Urine: 1.015 (ref 1.005–1.030)
Urobilinogen, UA: 0.2 mg/dL (ref 0.0–1.0)
pH: 6 (ref 5.0–8.0)

## 2015-12-30 NOTE — Patient Instructions (Signed)
Diarrhea Diarrhea is frequent loose and watery bowel movements. It can cause you to feel weak and dehydrated. Dehydration can cause you to become tired and thirsty, have a dry mouth, and have decreased urination that often is dark yellow. Diarrhea is a sign of another problem, most often an infection that will not last long. In most cases, diarrhea typically lasts 2-3 days. However, it can last longer if it is a sign of something more serious. It is important to treat your diarrhea as directed by your caregiver to lessen or prevent future episodes of diarrhea. CAUSES  Some common causes include:  Gastrointestinal infections caused by viruses, bacteria, or parasites.  Food poisoning or food allergies.  Certain medicines, such as antibiotics, chemotherapy, and laxatives.  Artificial sweeteners and fructose.  Digestive disorders. HOME CARE INSTRUCTIONS  Ensure adequate fluid intake (hydration): Have 1 cup (8 oz) of fluid for each diarrhea episode. Avoid fluids that contain simple sugars or sports drinks, fruit juices, whole milk products, and sodas. Your urine should be clear or pale yellow if you are drinking enough fluids. Hydrate with an oral rehydration solution that you can purchase at pharmacies, retail stores, and online. You can prepare an oral rehydration solution at home by mixing the following ingredients together:   - tsp table salt.   tsp baking soda.   tsp salt substitute containing potassium chloride.  1  tablespoons sugar.  1 L (34 oz) of water.  Certain foods and beverages may increase the speed at which food moves through the gastrointestinal (GI) tract. These foods and beverages should be avoided and include:  Caffeinated and alcoholic beverages.  High-fiber foods, such as raw fruits and vegetables, nuts, seeds, and whole grain breads and cereals.  Foods and beverages sweetened with sugar alcohols, such as xylitol, sorbitol, and mannitol.  Some foods may be well  tolerated and may help thicken stool including:  Starchy foods, such as rice, toast, pasta, low-sugar cereal, oatmeal, grits, baked potatoes, crackers, and bagels.  Bananas.  Applesauce.  Add probiotic-rich foods to help increase healthy bacteria in the GI tract, such as yogurt and fermented milk products.  Wash your hands well after each diarrhea episode.  Only take over-the-counter or prescription medicines as directed by your caregiver.  Take a warm bath to relieve any burning or pain from frequent diarrhea episodes. SEEK IMMEDIATE MEDICAL CARE IF:   You are unable to keep fluids down.  You have persistent vomiting.  You have blood in your stool, or your stools are black and tarry.  You do not urinate in 6-8 hours, or there is only a small amount of very dark urine.  You have abdominal pain that increases or localizes.  You have weakness, dizziness, confusion, or light-headedness.  You have a severe headache.  Your diarrhea gets worse or does not get better.  You have a fever or persistent symptoms for more than 2-3 days.  You have a fever and your symptoms suddenly get worse. MAKE SURE YOU:   Understand these instructions.  Will watch your condition.  Will get help right away if you are not doing well or get worse.   This information is not intended to replace advice given to you by your health care provider. Make sure you discuss any questions you have with your health care provider.   Document Released: 11/09/2002 Document Revised: 12/10/2014 Document Reviewed: 07/27/2012 Elsevier Interactive Patient Education 2016 Elsevier Inc[employer]   Food Choices to Help Relieve Diarrhea, Adult When you have   diarrhea, the foods you eat and your eating habits are very important. Choosing the right foods and drinks can help relieve diarrhea. Also, because diarrhea can last up to 7 days, you need to replace lost fluids and electrolytes (such as sodium, potassium, and chloride) in  order to help prevent dehydration.  WHAT GENERAL GUIDELINES DO I NEED TO FOLLOW?  Slowly drink 1 cup (8 oz) of fluid for each episode of diarrhea. If you are getting enough fluid, your urine will be clear or pale yellow.  Eat starchy foods. Some good choices include white rice, white toast, pasta, low-fiber cereal, baked potatoes (without the skin), saltine crackers, and bagels.  Avoid large servings of any cooked vegetables.  Limit fruit to two servings per day. A serving is  cup or 1 small piece.  Choose foods with less than 2 g of fiber per serving.  Limit fats to less than 8 tsp (38 g) per day.  Avoid fried foods.  Eat foods that have probiotics in them. Probiotics can be found in certain dairy products.  Avoid foods and beverages that may increase the speed at which food moves through the stomach and intestines (gastrointestinal tract). Things to avoid include:  High-fiber foods, such as dried fruit, raw fruits and vegetables, nuts, seeds, and whole grain foods.  Spicy foods and high-fat foods.  Foods and beverages sweetened with high-fructose corn syrup, honey, or sugar alcohols such as xylitol, sorbitol, and mannitol. WHAT FOODS ARE RECOMMENDED? Grains White rice. White, French, or pita breads (fresh or toasted), including plain rolls, buns, or bagels. White pasta. Saltine, soda, or graham crackers. Pretzels. Low-fiber cereal. Cooked cereals made with water (such as cornmeal, farina, or cream cereals). Plain muffins. Matzo. Melba toast. Zwieback.  Vegetables Potatoes (without the skin). Strained tomato and vegetable juices. Most well-cooked and canned vegetables without seeds. Tender lettuce. Fruits Cooked or canned applesauce, apricots, cherries, fruit cocktail, grapefruit, peaches, pears, or plums. Fresh bananas, apples without skin, cherries, grapes, cantaloupe, grapefruit, peaches, oranges, or plums.  Meat and Other Protein Products Baked or boiled chicken. Eggs. Tofu.  Fish. Seafood. Smooth peanut butter. Ground or well-cooked tender beef, ham, veal, lamb, pork, or poultry.  Dairy Plain yogurt, kefir, and unsweetened liquid yogurt. Lactose-free milk, buttermilk, or soy milk. Plain hard cheese. Beverages Sport drinks. Clear broths. Diluted fruit juices (except prune). Regular, caffeine-free sodas such as ginger ale. Water. Decaffeinated teas. Oral rehydration solutions. Sugar-free beverages not sweetened with sugar alcohols. Other Bouillon, broth, or soups made from recommended foods.  The items listed above may not be a complete list of recommended foods or beverages. Contact your dietitian for more options. WHAT FOODS ARE NOT RECOMMENDED? Grains Whole grain, whole wheat, bran, or rye breads, rolls, pastas, crackers, and cereals. Wild or brown rice. Cereals that contain more than 2 g of fiber per serving. Corn tortillas or taco shells. Cooked or dry oatmeal. Granola. Popcorn. Vegetables Raw vegetables. Cabbage, broccoli, Brussels sprouts, artichokes, baked beans, beet greens, corn, kale, legumes, peas, sweet potatoes, and yams. Potato skins. Cooked spinach and cabbage. Fruits Dried fruit, including raisins and dates. Raw fruits. Stewed or dried prunes. Fresh apples with skin, apricots, mangoes, pears, raspberries, and strawberries.  Meat and Other Protein Products Chunky peanut butter. Nuts and seeds. Beans and lentils. Bacon.  Dairy High-fat cheeses. Milk, chocolate milk, and beverages made with milk, such as milk shakes. Cream. Ice cream. Sweets and Desserts Sweet rolls, doughnuts, and sweet breads. Pancakes and waffles. Fats and Oils Butter. Cream sauces.   Margarine. Salad oils. Plain salad dressings. Olives. Avocados.  Beverages Caffeinated beverages (such as coffee, tea, soda, or energy drinks). Alcoholic beverages. Fruit juices with pulp. Prune juice. Soft drinks sweetened with high-fructose corn syrup or sugar alcohols. Other Coconut. Hot sauce.  Chili powder. Mayonnaise. Gravy. Cream-based or milk-based soups.  The items listed above may not be a complete list of foods and beverages to avoid. Contact your dietitian for more information. WHAT SHOULD I DO IF I BECOME DEHYDRATED? Diarrhea can sometimes lead to dehydration. Signs of dehydration include dark urine and dry mouth and skin. If you think you are dehydrated, you should rehydrate with an oral rehydration solution. These solutions can be purchased at pharmacies, retail stores, or online.  Drink -1 cup (120-240 mL) of oral rehydration solution each time you have an episode of diarrhea. If drinking this amount makes your diarrhea worse, try drinking smaller amounts more often. For example, drink 1-3 tsp (5-15 mL) every 5-10 minutes.  A general rule for staying hydrated is to drink 1-2 L of fluid per day. Talk to your health care provider about the specific amount you should be drinking each day. Drink enough fluids to keep your urine clear or pale yellow.   This information is not intended to replace advice given to you by your health care provider. Make sure you discuss any questions you have with your health care provider.   Document Released: 02/09/2004 Document Revised: 12/10/2014 Document Reviewed: 10/12/2013 Elsevier Interactive Patient Education 2016 Elsevier Inc.   

## 2015-12-30 NOTE — Progress Notes (Signed)
Subjective:    Patient ID: Todd Carroll, male    DOB: Mar 24, 1976, 40 y.o.   MRN: [id number]  HPI Todd Carroll, a 40 year old male with a history of HIV presents complaining of diarrhea and dysuria over the past several weeks. Patient maintains that he ate a a soul food restaurant several weeks ago and has had diarrhea since that time.  Diarrhea is occurring approximately 4-5  times per day. Patient describes diarrhea as semisolid and watery. Diarrhea has been associated with abdominal pain described as aching and suspicious food/drink several weeks ago.  Associated symptoms include polyuria and polydipsia. Patient denies nausea, vomiting, rectal pain or bloody stools. He also denies sick contacts. He maintains that he has been drinking ginger ale and taking alka seltzer with minimal relief.    Past Medical History  Diagnosis Date  . ALLERGIC RHINITIS 08/27/2007  . GERD 05/04/2008  No Known Allergies  Social History   Social History  . Marital Status: Single    Spouse Name: N/A  . Number of Children: N/A  . Years of Education: N/A   Occupational History  . Not on file.   Social History Main Topics  . Smoking status: Never Smoker   . Smokeless tobacco: Never Used  . Alcohol Use: 0.5 oz/week    1 drink(s) per week     Comment: social , liquor  . Drug Use: No  . Sexual Activity:    Partners: Female, Male    Birth Control/ Protection: Condom     Comment: pt declined condoms   Other Topics Concern  . Not on file   Social History Narrative      .Review of Systems  Constitutional: Negative for fever, fatigue and unexpected weight change.  HENT: Negative.   Eyes: Negative for visual disturbance.  Respiratory: Negative.   Cardiovascular: Negative.   Gastrointestinal: Positive for vomiting, abdominal pain, diarrhea and blood in stool. Negative for constipation, anal bleeding and rectal pain.  Endocrine: Positive for polydipsia and polyuria.  Genitourinary:  Positive for urgency and frequency.  Musculoskeletal: Negative.  Negative for myalgias and neck pain.  Skin: Negative.   Allergic/Immunologic: Negative.   Neurological: Negative.  Negative for dizziness, weakness and headaches.  Hematological: Negative.   Psychiatric/Behavioral: Negative.        Objective:   Physical Exam  Constitutional: He is oriented to person, place, and time. He appears well-developed and well-nourished.  HENT:  Head: Normocephalic and atraumatic.  Right Ear: External ear normal.  Left Ear: External ear normal.  Mouth/Throat: Oropharynx is clear and moist.  Eyes: Conjunctivae and EOM are normal. Pupils are equal, round, and reactive to light.  Neck: Normal range of motion. Neck supple.  Cardiovascular: Normal rate, regular rhythm, normal heart sounds and intact distal pulses.   Pulmonary/Chest: Effort normal and breath sounds normal.  Abdominal: Soft. Bowel sounds are normal. There is no tenderness.  Genitourinary: Penis normal. Uncircumcised.  Musculoskeletal: Normal range of motion.  Neurological: He is alert and oriented to person, place, and time. He has normal reflexes.  Skin: Skin is warm and dry.  Psychiatric: He has a normal mood and affect. His behavior is normal. Judgment and thought content normal.         BP 133/94 mmHg  Pulse 77  Temp(Src) 98.3 F (36.8 C) (Oral)  Resp 16  Ht 6\' 1"  (1.854 m)  Wt 249 lb (112.946 kg)  BMI 32.86 kg/m2 Assessment & Plan:  1. Generalized abdominal pain Recommend  a bland, soft diet. Also increase fluid intake. He maintains that he eats a lot of salad. I recommend that he decreases fiber intake due to increased diarrhea.  - POCT urinalysis dipstick - CBC with Differential - COMPLETE METABOLIC PANEL WITH GFR  2. Polydipsia Todd Carroll as a history of prediabetes. Will check hemoglobin A1c - Hemoglobin A1c  3. Polyuria - Hemoglobin A1c  4. Dysuria - COMPLETE METABOLIC PANEL WITH GFR - GC/Chlamydia Probe  Amp  5. Rectal bleeding - IFOBT POC (occult bld, rslt in office)  6. Diarrhea, unspecified type - Stool culture - [name] + PARASITE EXAM  7. Human immunodeficiency virus (HIV) disease [facility]) Todd Carroll is a patient of Dr. Judyann Munson at [facility]. He maintains that he has been taking Stribild consistently for greater than 1 year.    Darold Miley M, FNP  The patient was given clear instructions to go to ER or return to medical center if symptoms do not improve, worsen or new problems develop. The patient verbalized understanding. Will notify patient with laboratory results.

## 2015-12-31 LAB — GC/CHLAMYDIA PROBE AMP
CT PROBE, AMP APTIMA: NOT DETECTED
GC PROBE AMP APTIMA: NOT DETECTED

## 2015-12-31 LAB — HEMOGLOBIN A1C
Hgb A1c MFr Bld: 6.4 % — ABNORMAL HIGH (ref <5.7)
MEAN PLASMA GLUCOSE: 137 mg/dL — AB (ref <117)

## 2016-01-01 ENCOUNTER — Encounter: Payer: Self-pay | Admitting: Family Medicine

## 2016-01-02 ENCOUNTER — Telehealth: Payer: Self-pay

## 2016-01-02 ENCOUNTER — Other Ambulatory Visit: Payer: Self-pay | Admitting: Internal Medicine

## 2016-01-02 DIAGNOSIS — B20 Human immunodeficiency virus [HIV] disease: Secondary | ICD-10-CM

## 2016-01-02 NOTE — Telephone Encounter (Signed)
-----   Message from Massie Maroon, [place] sent at 01/01/2016  2:07 PM EST ----- Regarding: lab results Please inform Todd Carroll that complete blood count was within a normal range. I will review [name], parasite and stool culture results as they become available. Creatinine results are mildly elevated and GFR reduced. Will schedule lab appointment in 1 month to repeat. Also, inform patient that hemoglobin A1C is elevated at 6.4, which is consistent with prediabetes. Recommend a lowfat, low carbohydrate diet divided over 5-6 small meals, increase water intake to 6-8 glasses, and 150 minutes per week of cardiovascular exercise. However, continue bland diet until diarrhea subsides. If ova & parasite tests are negative, will send in Rx for antidiarrheal medication.   Thanks  ----- Message -----    From: [facility]    Sent: 12/31/2015   1:19 AM      To: Massie Maroon, FNP

## 2016-01-02 NOTE — Telephone Encounter (Signed)
Called and spoke with patient. Asked that patient follow a low fat/ low carb diet over 5 to 6 small meals daily. Drink 6 to 8 glasses of water daily. Try to get some exercise weekly around 150 minutes of cardio. Advised of elevated creatinine levels and that we needed to recheck in 1 month, appointment was scheduled. Advised patient that when stool results were back we would call again. Thanks!

## 2016-01-03 LAB — STOOL CULTURE

## 2016-01-09 ENCOUNTER — Other Ambulatory Visit: Payer: Self-pay | Admitting: Family Medicine

## 2016-01-09 DIAGNOSIS — R195 Other fecal abnormalities: Secondary | ICD-10-CM

## 2016-01-09 LAB — OVA AND PARASITE EXAMINATION: OP: NONE SEEN

## 2016-01-10 ENCOUNTER — Telehealth: Payer: Self-pay | Admitting: *Deleted

## 2016-01-10 ENCOUNTER — Other Ambulatory Visit: Payer: Self-pay | Admitting: Family Medicine

## 2016-01-10 DIAGNOSIS — R195 Other fecal abnormalities: Secondary | ICD-10-CM

## 2016-01-10 MED ORDER — FLUCONAZOLE 150 MG PO TABS: 150.0000 mg | ORAL_TABLET | Freq: Once | ORAL | Status: DC

## 2016-01-10 NOTE — Progress Notes (Signed)
Reviewed ova and parasite results, which yielded yeast. Will start a trial of Diflucan for 14 days. Patient to follow up in office as needed. Notified Infectious Disease clinic of finding.    Meds ordered this encounter  Medications  . fluconazole (DIFLUCAN) 150 MG tablet    Sig: Take 1 tablet (150 mg total) by mouth once.    Dispense:  14 tablet    Refill:  0     Mohamadou Maciver M, FNP

## 2016-01-23 ENCOUNTER — Other Ambulatory Visit (INDEPENDENT_AMBULATORY_CARE_PROVIDER_SITE_OTHER): Payer: Self-pay

## 2016-01-23 DIAGNOSIS — Z113 Encounter for screening for infections with a predominantly sexual mode of transmission: Secondary | ICD-10-CM

## 2016-01-23 DIAGNOSIS — B20 Human immunodeficiency virus [HIV] disease: Secondary | ICD-10-CM

## 2016-01-23 LAB — CBC WITH DIFFERENTIAL/PLATELET
Basophils Absolute: 0 K/uL (ref 0.0–0.1)
Basophils Relative: 0 % (ref 0–1)
Eosinophils Absolute: 0.1 K/uL (ref 0.0–0.7)
Eosinophils Relative: 2 % (ref 0–5)
HCT: 40.8 % (ref 39.0–52.0)
Hemoglobin: 13.9 g/dL (ref 13.0–17.0)
Lymphocytes Relative: 41 % (ref 12–46)
Lymphs Abs: 2.4 K/uL (ref 0.7–4.0)
MCH: 30.4 pg (ref 26.0–34.0)
MCHC: 34.1 g/dL (ref 30.0–36.0)
MCV: 89.3 fL (ref 78.0–100.0)
MPV: 9.5 fL (ref 8.6–12.4)
Monocytes Absolute: 0.5 K/uL (ref 0.1–1.0)
Monocytes Relative: 8 % (ref 3–12)
Neutro Abs: 2.9 K/uL (ref 1.7–7.7)
Neutrophils Relative %: 49 % (ref 43–77)
Platelets: 363 K/uL (ref 150–400)
RBC: 4.57 MIL/uL (ref 4.22–5.81)
RDW: 14.9 % (ref 11.5–15.5)
WBC: 5.9 K/uL (ref 4.0–10.5)

## 2016-01-23 LAB — BASIC METABOLIC PANEL
BUN: 13 mg/dL (ref 7–25)
CHLORIDE: 103 mmol/L (ref 98–110)
CO2: 25 mmol/L (ref 20–31)
CREATININE: 1.42 mg/dL — AB (ref 0.60–1.35)
Calcium: 8.9 mg/dL (ref 8.6–10.3)
Glucose, Bld: 97 mg/dL (ref 65–99)
POTASSIUM: 4.4 mmol/L (ref 3.5–5.3)
Sodium: 139 mmol/L (ref 135–146)

## 2016-01-24 LAB — RPR

## 2016-01-24 LAB — T-HELPER CELL (CD4) - (RCID CLINIC ONLY)
CD4 T CELL ABS: 860 /uL (ref 400–2700)
CD4 T CELL HELPER: 35 % (ref 33–55)

## 2016-01-24 LAB — HIV-1 RNA QUANT-NO REFLEX-BLD: HIV 1 RNA Quant: <20 copies/mL (ref <20)

## 2016-01-31 ENCOUNTER — Other Ambulatory Visit: Payer: Self-pay

## 2016-02-03 ENCOUNTER — Other Ambulatory Visit: Payer: Self-pay | Admitting: Family Medicine

## 2016-02-03 ENCOUNTER — Other Ambulatory Visit: Payer: Self-pay

## 2016-02-03 ENCOUNTER — Other Ambulatory Visit: Payer: Self-pay | Admitting: *Deleted

## 2016-02-03 DIAGNOSIS — R7989 Other specified abnormal findings of blood chemistry: Secondary | ICD-10-CM

## 2016-02-03 LAB — COMPLETE METABOLIC PANEL WITH GFR
ALBUMIN: 4.1 g/dL (ref 3.6–5.1)
ALK PHOS: 91 U/L (ref 40–115)
ALT: 16 U/L (ref 9–46)
AST: 15 U/L (ref 10–40)
BUN: 15 mg/dL (ref 7–25)
CO2: 26 mmol/L (ref 20–31)
Calcium: 9 mg/dL (ref 8.6–10.3)
Chloride: 103 mmol/L (ref 98–110)
Creat: 1.16 mg/dL (ref 0.60–1.35)
GFR, EST NON AFRICAN AMERICAN: 79 mL/min (ref >=60)
GFR, Est African American: >89 mL/min (ref >=60)
GLUCOSE: 101 mg/dL — AB (ref 65–99)
POTASSIUM: 4.2 mmol/L (ref 3.5–5.3)
SODIUM: 137 mmol/L (ref 135–146)
Total Bilirubin: 0.4 mg/dL (ref 0.2–1.2)
Total Protein: 7.2 g/dL (ref 6.1–8.1)

## 2016-02-07 NOTE — Telephone Encounter (Signed)
Error

## 2016-02-14 ENCOUNTER — Ambulatory Visit (INDEPENDENT_AMBULATORY_CARE_PROVIDER_SITE_OTHER): Payer: Self-pay | Admitting: Internal Medicine

## 2016-02-14 ENCOUNTER — Encounter: Payer: Self-pay | Admitting: Internal Medicine

## 2016-02-14 VITALS — BP 131/94 | HR 86 | Temp 97.8°F | Wt 237.0 lb

## 2016-02-14 DIAGNOSIS — Z0000 Encounter for general adult medical examination without abnormal findings: Secondary | ICD-10-CM

## 2016-02-14 DIAGNOSIS — B20 Human immunodeficiency virus [HIV] disease: Secondary | ICD-10-CM

## 2016-02-14 DIAGNOSIS — R197 Diarrhea, unspecified: Secondary | ICD-10-CM

## 2016-02-14 LAB — LIPID PANEL
CHOLESTEROL: 227 mg/dL — AB (ref 125–200)
HDL: 51 mg/dL (ref >=40)
LDL Cholesterol: 156 mg/dL — ABNORMAL HIGH (ref <130)
Total CHOL/HDL Ratio: 4.5 Ratio (ref <=5.0)
Triglycerides: 99 mg/dL (ref <150)
VLDL: 20 mg/dL (ref <30)

## 2016-02-14 NOTE — Progress Notes (Signed)
Patient ID: Todd Carroll, male   DOB: 1976/06/20, 40 y.o.   MRN: [id number]       Patient ID: Todd RainwaterChristopher M Carroll, male   DOB: 1976/06/20, 40 y.o.   MRN: [id number]  HPI 40yo M with HIV disease, CD 4 count of 860/VL<20, on stribild. He continues to have good adherence with his HIV regimen. In January had diarrhea that was worked up by sickle cell clinic that tested stool cx and found yeast -> and patient given diflucan. Since then, he stills has intermittent episodes of loose stools. He notices that if he decreases starch intake that he has formed stools. He describes running stools, [facility] loose mudpile.otherwise, type 3.  Fhx: diabetes  Outpatient Encounter Prescriptions as of 02/14/2016  Medication Sig  . mometasone (NASONEX) 50 MCG/ACT nasal spray Place 2 sprays into the nose daily.  Marland Kitchen. omeprazole (PRILOSEC) 20 MG capsule Take 1 capsule (20 mg total) by mouth daily.  . STRIBILD 150-150-200-300 MG TABS tablet TAKE 1 TABLET BY MOUTH DAILY WITH BREAKFAST  . fluconazole (DIFLUCAN) 150 MG tablet Take 1 tablet (150 mg total) by mouth once. (Patient not taking: Reported on 02/14/2016)  . [DISCONTINUED] esomeprazole (NEXIUM) 20 MG capsule Take 1 capsule (20 mg total) by mouth daily at 12 noon. (Patient not taking: Reported on 02/14/2016)   No facility-administered encounter medications on file as of 02/14/2016.     Patient Active Problem List   Diagnosis Date Noted  . Bursitis of elbow 11/01/2014  . 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 Scalp pruritus 07/25/2014  . Itch 05/25/2014  . Rash and nonspecific skin eruption 01/05/2014  . Folliculitis 07/13/2013  . Pain, dental 07/13/2013  . Dysuria 04/29/2013  . Human immunodeficiency virus (HIV) disease (HCC) 02/27/2013  . GERD 05/04/2008  . ALLERGIC RHINITIS 08/27/2007     There are no preventive care reminders to display for this patient.   Review of Systems + intermittent diarrhea. Otherwise 10 point ros is  negative Physical Exam   BP 131/94 mmHg  Pulse 86  Temp(Src) 97.8 F (36.6 C) (Oral)  Wt 237 lb (107.502 kg) Physical Exam  Constitutional: He is oriented to person, place, and time. He appears well-developed and well-nourished. No distress.  HENT:  Mouth/Throat: Oropharynx is clear and moist. No oropharyngeal exudate.  Cardiovascular: Normal rate, regular rhythm and normal heart sounds. Exam reveals no gallop and no friction rub.  No murmur heard.  Pulmonary/Chest: Effort normal and breath sounds normal. No respiratory distress. He has no wheezes.  Abdominal: Soft. Bowel sounds are normal. He exhibits no distension. There is no tenderness.  Lymphadenopathy:  He has no cervical adenopathy.  Neurological: He is alert and oriented to person, place, and time.  Skin: Skin is warm and dry. No rash noted. No erythema.  Psychiatric: He has a normal mood and affect. His behavior is normal.    Lab Results  Component Value Date   CD4TCELL 35 01/23/2016   Lab Results  Component Value Date   CD4TABS 860 01/23/2016   CD4TABS 1440 07/27/2015   CD4TABS 1090 03/24/2015   Lab Results  Component Value Date   HIV1RNAQUANT <20 01/23/2016   Lab Results  Component Value Date   HEPBSAB NONREACTIVE 03/19/2013   No results found for: RPR  CBC Lab Results  Component Value Date   WBC 5.9 01/23/2016   RBC 4.57 01/23/2016   HGB 13.9 01/23/2016   HCT 40.8 01/23/2016   PLT 363 01/23/2016   MCV 89.3  01/23/2016   MCH 30.4 01/23/2016   MCHC 34.1 01/23/2016   RDW 14.9 01/23/2016   LYMPHSABS 2.4 01/23/2016   MONOABS 0.5 01/23/2016   EOSABS 0.1 01/23/2016   BASOSABS 0.0 01/23/2016   BMET Lab Results  Component Value Date   NA 137 02/03/2016   K 4.2 02/03/2016   CL 103 02/03/2016   CO2 26 02/03/2016   GLUCOSE 101* 02/03/2016   BUN 15 02/03/2016   CREATININE 1.16 02/03/2016   CALCIUM 9.0 02/03/2016   GFRNONAA 79 02/03/2016   GFRAA >89 02/03/2016     Assessment and Plan  hiv  disease = well controlled. We iwll continue on current regimen. Consider changing to genvoya at next visit. Creatinine function is normal.  Possible irritable bowel syndrome = will check ig A and TTGA for celiac disease  Will be seen by research team to consider Reprieve study  Spent 40 min with patient with greater than 50% in counseling on dietary intake and impact on bowel habits

## 2016-02-15 LAB — IGA: IGA: 185 mg/dL (ref 68–379)

## 2016-02-16 LAB — TISSUE TRANSGLUTAMINASE, IGA: TISSUE TRANSGLUTAMINASE AB, IGA: 1 U/mL (ref <4)

## 2016-04-23 ENCOUNTER — Other Ambulatory Visit: Payer: Self-pay | Admitting: Internal Medicine

## 2016-04-23 DIAGNOSIS — K219 Gastro-esophageal reflux disease without esophagitis: Secondary | ICD-10-CM

## 2016-05-14 ENCOUNTER — Other Ambulatory Visit: Payer: Self-pay | Admitting: Internal Medicine

## 2016-07-05 ENCOUNTER — Telehealth: Payer: Self-pay | Admitting: *Deleted

## 2016-07-05 ENCOUNTER — Other Ambulatory Visit: Payer: Self-pay | Admitting: *Deleted

## 2016-07-05 ENCOUNTER — Other Ambulatory Visit: Payer: Self-pay

## 2016-07-05 DIAGNOSIS — R309 Painful micturition, unspecified: Secondary | ICD-10-CM

## 2016-07-05 NOTE — Telephone Encounter (Signed)
Patient in office today to renew his ADAP and asked to see a nurse. He advised that for the past 2 weeks he has had burning and pain when he urinates. No blood or discharge, no fever or outside irritation. He advised he does notice itching when he washes as well but it feels like it is on the inside. Urine is not dark or changed in any way. Patient states he has this issue every summer and not sure why.  Spoke with Dr Drue Second and was advised to do penial swab and urine studies on the patient and advise will call once resulted.   Patient fine with that plan and will wait to get call tomorrow 07/06/16.

## 2016-07-06 ENCOUNTER — Encounter: Payer: Self-pay | Admitting: Internal Medicine

## 2016-07-06 ENCOUNTER — Telehealth: Payer: Self-pay | Admitting: *Deleted

## 2016-07-06 LAB — URINALYSIS, ROUTINE W REFLEX MICROSCOPIC
Bilirubin Urine: NEGATIVE
Glucose, UA: NEGATIVE
Hgb urine dipstick: NEGATIVE
Ketones, ur: NEGATIVE
Leukocytes, UA: NEGATIVE
NITRITE: NEGATIVE
PH: 6 (ref 5.0–8.0)
Protein, ur: NEGATIVE
SPECIFIC GRAVITY, URINE: 1.006 (ref 1.001–1.035)

## 2016-07-06 LAB — CYTOLOGY, (ORAL, ANAL, URETHRAL) ANCILLARY ONLY
Chlamydia: NEGATIVE
NEISSERIA GONORRHEA: NEGATIVE
TRICH (WINDOWPATH): NEGATIVE

## 2016-07-06 LAB — URINE CULTURE: ORGANISM ID, BACTERIA: NO GROWTH

## 2016-07-06 LAB — URINE CYTOLOGY ANCILLARY ONLY
CHLAMYDIA, DNA PROBE: NEGATIVE
Neisseria Gonorrhea: NEGATIVE

## 2016-07-06 NOTE — Telephone Encounter (Signed)
Patient called for results of his swabs and urine culture. All negative; he feels he did not do it correctly. His throat was not swabbed and he swabbed his penis instead of anus. He is still having symptoms of burning in his penis. Advised to go to the health department to be rechecked as our office is currently closed. Todd Carroll

## 2016-07-12 LAB — CYTOLOGY, (ORAL, ANAL, URETHRAL) ANCILLARY ONLY: Candida vaginitis: NEGATIVE

## 2016-07-18 ENCOUNTER — Ambulatory Visit (INDEPENDENT_AMBULATORY_CARE_PROVIDER_SITE_OTHER): Payer: Self-pay | Admitting: Family Medicine

## 2016-07-18 ENCOUNTER — Encounter: Payer: Self-pay | Admitting: Family Medicine

## 2016-07-18 ENCOUNTER — Other Ambulatory Visit (HOSPITAL_COMMUNITY)
Admission: RE | Admit: 2016-07-18 | Discharge: 2016-07-18 | Disposition: A | Discharge status: Home / Self Care | Payer: Self-pay | Source: Ambulatory Visit | Attending: Family Medicine | Admitting: Family Medicine

## 2016-07-18 VITALS — BP 120/84 | HR 82 | Temp 98.8°F | Resp 16 | Ht 74.0 in | Wt 233.0 lb

## 2016-07-18 DIAGNOSIS — R300 Dysuria: Secondary | ICD-10-CM

## 2016-07-18 DIAGNOSIS — Z113 Encounter for screening for infections with a predominantly sexual mode of transmission: Secondary | ICD-10-CM | POA: Insufficient documentation

## 2016-07-18 DIAGNOSIS — Z131 Encounter for screening for diabetes mellitus: Secondary | ICD-10-CM

## 2016-07-18 LAB — COMPLETE METABOLIC PANEL WITH GFR
ALT: 15 U/L (ref 9–46)
AST: 16 U/L (ref 10–40)
Albumin: 4.1 g/dL (ref 3.6–5.1)
Alkaline Phosphatase: 90 U/L (ref 40–115)
BILIRUBIN TOTAL: 0.2 mg/dL (ref 0.2–1.2)
BUN: 13 mg/dL (ref 7–25)
CO2: 25 mmol/L (ref 20–31)
CREATININE: 1.14 mg/dL (ref 0.60–1.35)
Calcium: 9 mg/dL (ref 8.6–10.3)
Chloride: 103 mmol/L (ref 98–110)
GFR, Est African American: >89 mL/min (ref >=60)
GFR, Est Non African American: 81 mL/min (ref >=60)
GLUCOSE: 100 mg/dL — AB (ref 65–99)
Potassium: 4.6 mmol/L (ref 3.5–5.3)
SODIUM: 140 mmol/L (ref 135–146)
TOTAL PROTEIN: 7.1 g/dL (ref 6.1–8.1)

## 2016-07-18 LAB — CBC WITH DIFFERENTIAL/PLATELET
BASOS PCT: 0 %
Basophils Absolute: 0 cells/uL (ref 0–200)
EOS ABS: 150 {cells}/uL (ref 15–500)
EOS PCT: 2 %
HCT: 39.2 % (ref 38.5–50.0)
Hemoglobin: 13.1 g/dL — ABNORMAL LOW (ref 13.2–17.1)
LYMPHS ABS: 3300 {cells}/uL (ref 850–3900)
Lymphocytes Relative: 44 %
MCH: 30 pg (ref 27.0–33.0)
MCHC: 33.4 g/dL (ref 32.0–36.0)
MCV: 89.9 fL (ref 80.0–100.0)
MONOS PCT: 7 %
MPV: 9.4 fL (ref 7.5–12.5)
Monocytes Absolute: 525 cells/uL (ref 200–950)
NEUTROS ABS: 3525 {cells}/uL (ref 1500–7800)
Neutrophils Relative %: 47 %
PLATELETS: 368 10*3/uL (ref 140–400)
RBC: 4.36 MIL/uL (ref 4.20–5.80)
RDW: 14.7 % (ref 11.0–15.0)
WBC: 7.5 10*3/uL (ref 3.8–10.8)

## 2016-07-18 LAB — POCT GLYCOSYLATED HEMOGLOBIN (HGB A1C): Hemoglobin A1C: 5.8

## 2016-07-18 NOTE — Patient Instructions (Signed)
Will let you know about urine.

## 2016-07-18 NOTE — Progress Notes (Signed)
Todd Carroll, is a 40 y.o. male  WUJ:[id number]  NFA:[id number]  DOB - 1976-09-18  CC:  Chief Complaint  Patient presents with  . Follow-up    itching and burning during urination   . Gastroesophageal Reflux       HPI: Todd Carroll is a 40 y.o. male here for routine follow-up per Mrs. Hollis. Patient has a history of allergic rhinitis, GERD, and HIV. He is follow for HIV at [facility]. He reports he is having no problems with allergies or GERD at this time and does not need refills on medications.  He only complaint is of burning and itching when he urinates. He had a negative urine dip on 8/3 at [facility]. When he was seen in Jan, 2017 he had an A1C of 6.4. He is not on medication. Will recheck that today.   Health Maintenance:  UTD.  No Known Allergies Past Medical History:  Diagnosis Date  . ALLERGIC RHINITIS 08/27/2007  . GERD 05/04/2008   Current Outpatient Prescriptions on File Prior to Visit  Medication Sig Dispense Refill  . mometasone (NASONEX) 50 MCG/ACT nasal spray Place 2 sprays into the nose daily.    Marland Kitchen omeprazole (PRILOSEC) 20 MG capsule TAKE 1 CAPSULE(20 MG) BY MOUTH DAILY 30 capsule 5  . STRIBILD 150-150-200-300 MG TABS tablet TAKE 1 TABLET BY MOUTH DAILY WITH BREAKFAST 30 tablet 5  . fluconazole (DIFLUCAN) 150 MG tablet Take 1 tablet (150 mg total) by mouth once. (Patient not taking: Reported on 02/14/2016) 14 tablet 0   No current facility-administered medications on file prior to visit.    History reviewed. No pertinent family history. Social History   Social History  . Marital status: Single    Spouse name: N/A  . Number of children: N/A  . Years of education: N/A   Occupational History  . Not on file.   Social History Main Topics  . Smoking status: Never Smoker  . Smokeless tobacco: Never Used  . Alcohol use No     Comment: social , liquor  . Drug use: No  . Sexual activity: Not Currently    Partners: Female, Male    Birth control/ protection:  Condom     Comment: pt declined condoms   Other Topics Concern  . Not on file   Social History Narrative  . No narrative on file    Review of Systems: Constitutional: Negative for fever, chills, appetite change, weight loss,  Fatigue. Skin: Negative for rashes or lesions of concern. HENT: Negative for ear pain, ear discharge.nose bleeds. Deaf in one ear. Eyes: Negative for pain, discharge, redness, itching and visual disturbance. Neck: Negative for pain, stiffness Respiratory: Negative for cough, shortness of breath,   Cardiovascular: Negative for chest pain, palpitations and leg swelling. Gastrointestinal: Negative for abdominal pain, nausea, vomiting, diarrhea, constipations Genitourinary: Negative for dysuria, urgency, frequency, hematuria. Reports burning and itching Musculoskeletal: Negative for back pain, joint pain, joint  swelling, and gait problem.Negative for weakness. Neurological: Negative for dizziness, tremors, seizures, syncope,   light-headedness, numbness and headaches.  Hematological: Negative for easy bruising or bleeding Psychiatric/Behavioral: Negative for depression, anxiety, decreased concentration, confusion   Objective:   Vitals:   07/18/16 0820  BP: 120/84  Pulse: 82  Resp: 16  Temp: 98.8 F (37.1 C)    Physical Exam: Constitutional: Patient appears well-developed and well-nourished. No distress. HENT: Normocephalic, atraumatic, External right and left ear normal. Oropharynx is clear and moist.  Eyes: Conjunctivae and EOM are normal. PERRLA, no scleral  icterus. Neck: Normal ROM. Neck supple. No lymphadenopathy, No thyromegaly. CVS: RRR, S1/S2 +, no murmurs, no gallops, no rubs Pulmonary: Effort and breath sounds normal, no stridor, rhonchi, wheezes, rales.  Abdominal: Soft. Normoactive BS,, no distension, tenderness, rebound or guarding.  Musculoskeletal: Normal range of motion. No edema and no tenderness.  Neuro: Alert.Normal muscle tone  coordination. Non-focal Skin: Skin is warm and dry. No rash noted. Not diaphoretic. No erythema. No pallor. Psychiatric: Normal mood and affect. Behavior, judgment, thought content normal.  Lab Results  Component Value Date   WBC 5.9 01/23/2016   HGB 13.9 01/23/2016   HCT 40.8 01/23/2016   MCV 89.3 01/23/2016   PLT 363 01/23/2016   Lab Results  Component Value Date   CREATININE 1.16 02/03/2016   BUN 15 02/03/2016   NA 137 02/03/2016   K 4.2 02/03/2016   CL 103 02/03/2016   CO2 26 02/03/2016    Lab Results  Component Value Date   HGBA1C 6.4 (H) 12/30/2015   Lipid Panel     Component Value Date/Time   CHOL 227 (H) 02/14/2016 0955   TRIG 99 02/14/2016 0955   HDL 51 02/14/2016 0955   CHOLHDL 4.5 02/14/2016 0955   VLDL 20 02/14/2016 0955   LDLCALC 156 (H) 02/14/2016 0955       Assessment and plan:   1. Dysuria  - Urine cytology ancillary only, yeast.  2. Screening for diabetes mellitus  - POCT glycosylated hemoglobin (Hb A1C) - CBC w/Diff - COMPLETE METABOLIC PANEL WITH GFR   Return in about 6 months (around 01/18/2017).  The patient was given clear instructions to go to ER or return to medical center if symptoms don't improve, worsen or new problems develop. The patient verbalized understanding.    Henrietta Hoover FNP  07/18/2016, 9:41 AM

## 2016-07-23 LAB — URINE CYTOLOGY ANCILLARY ONLY: CANDIDA VAGINITIS: NEGATIVE

## 2016-08-31 ENCOUNTER — Ambulatory Visit (INDEPENDENT_AMBULATORY_CARE_PROVIDER_SITE_OTHER): Payer: Self-pay

## 2016-08-31 ENCOUNTER — Telehealth: Payer: Self-pay | Admitting: *Deleted

## 2016-08-31 DIAGNOSIS — Z23 Encounter for immunization: Secondary | ICD-10-CM

## 2016-08-31 NOTE — Telephone Encounter (Signed)
error 

## 2016-09-11 ENCOUNTER — Other Ambulatory Visit: Payer: Self-pay | Admitting: Internal Medicine

## 2016-09-11 DIAGNOSIS — K219 Gastro-esophageal reflux disease without esophagitis: Secondary | ICD-10-CM

## 2016-09-11 DIAGNOSIS — B20 Human immunodeficiency virus [HIV] disease: Secondary | ICD-10-CM

## 2016-10-11 ENCOUNTER — Other Ambulatory Visit: Payer: Self-pay | Admitting: *Deleted

## 2016-10-11 DIAGNOSIS — K219 Gastro-esophageal reflux disease without esophagitis: Secondary | ICD-10-CM

## 2016-10-11 DIAGNOSIS — B20 Human immunodeficiency virus [HIV] disease: Secondary | ICD-10-CM

## 2016-10-11 MED ORDER — ELVITEG-COBIC-EMTRICIT-TENOFDF 150-150-200-300 MG PO TABS: 1.0000 | ORAL_TABLET | Freq: Every day | ORAL | 1 refills | Status: DC

## 2016-10-16 ENCOUNTER — Other Ambulatory Visit: Payer: Self-pay | Admitting: Internal Medicine

## 2016-10-16 DIAGNOSIS — K219 Gastro-esophageal reflux disease without esophagitis: Secondary | ICD-10-CM

## 2016-10-30 ENCOUNTER — Other Ambulatory Visit: Payer: Self-pay

## 2016-11-01 ENCOUNTER — Other Ambulatory Visit: Payer: Self-pay

## 2016-11-02 ENCOUNTER — Encounter: Payer: Self-pay | Admitting: Family Medicine

## 2016-11-02 ENCOUNTER — Ambulatory Visit (INDEPENDENT_AMBULATORY_CARE_PROVIDER_SITE_OTHER): Payer: Self-pay | Admitting: Family Medicine

## 2016-11-02 VITALS — BP 124/80 | HR 75 | Temp 99.1°F | Resp 16 | Ht 74.0 in | Wt 232.0 lb

## 2016-11-02 DIAGNOSIS — R05 Cough: Secondary | ICD-10-CM

## 2016-11-02 DIAGNOSIS — R059 Cough, unspecified: Secondary | ICD-10-CM

## 2016-11-02 MED ORDER — AZITHROMYCIN 250 MG PO TABS: ORAL_TABLET | ORAL | 0 refills | Status: DC

## 2016-11-02 NOTE — Patient Instructions (Signed)
Lots of liquids.

## 2016-11-02 NOTE — Progress Notes (Signed)
Sharrell KuChristopher Hynek, is a 40 y.o. male  WUJ:[id number]  NFA:[id number]:[id number]  DOB - 08-22-1976  CC:  Chief Complaint  Patient presents with  . Cough    coughing up brownish/greenish mucus x 2 weeks  . Nasal Congestion       HPI: Sharrell KuChristopher Sorey is a 40 y.o. male here for a 2 week history of cough. He reports brownish/green mucus. He has had a sore throat that comes and goes, nasal congestion, mild sinus pressure. He has not documented a fever, denies chills. Denies ear ache. Reports mild headaches.    No Known Allergies Past Medical History:  Diagnosis Date  . ALLERGIC RHINITIS 08/27/2007  . GERD 05/04/2008   Current Outpatient Prescriptions on File Prior to Visit  Medication Sig Dispense Refill  . elvitegravir-cobicistat-emtricitabine-tenofovir (STRIBILD) 150-150-200-300 MG TABS tablet Take 1 tablet by mouth daily with breakfast. 30 tablet 1  . mometasone (NASONEX) 50 MCG/ACT nasal spray Place 2 sprays into the nose daily.    Marland Kitchen. omeprazole (PRILOSEC) 20 MG capsule TAKE 1 CAPSULE BY MOUTH DAILY 30 capsule 5   No current facility-administered medications on file prior to visit.    History reviewed. No pertinent family history. Social History   Social History  . Marital status: Single    Spouse name: N/A  . Number of children: N/A  . Years of education: N/A   Occupational History  . Not on file.   Social History Main Topics  . Smoking status: Never Smoker  . Smokeless tobacco: Never Used  . Alcohol use No     Comment: social , liquor  . Drug use: No  . Sexual activity: Not Currently    Partners: Female, Male    Birth control/ protection: Condom     Comment: pt declined condoms   Other Topics Concern  . Not on file   Social History Narrative  . No narrative on file    Review of Systems: See HPI>  Physical Exam: Constitutional: Patient appears well-developed and well-nourished. No distress. HENT: Normocephalic, atraumatic, External right and left ear normal.  Oropharynx is clear and moist.  Eyes: Conjunctivae and EOM are normal. PERRLA, no scleral icterus. Neck: Normal ROM. Neck supple. No lymphadenopathy, No thyromegaly. CVS: RRR, S1/S2 +, no murmurs, no gallops, no rubs Pulmonary: Effort and breath sounds normal, no stridor, rhonchi, wheezes, rales.  Neuro: Alert.Normal muscle tone coordination. Non-focal Skin: Skin is warm and dry. No rash noted. Not diaphoretic. No erythema. No pallor. Psychiatric: Normal mood and affect. Behavior, judgment, thought content normal.  Lab Results  Component Value Date   WBC 7.5 07/18/2016   HGB 13.1 (L) 07/18/2016   HCT 39.2 07/18/2016   MCV 89.9 07/18/2016   PLT 368 07/18/2016   Lab Results  Component Value Date   CREATININE 1.14 07/18/2016   BUN 13 07/18/2016   NA 140 07/18/2016   K 4.6 07/18/2016   CL 103 07/18/2016   CO2 25 07/18/2016    Lab Results  Component Value Date   HGBA1C 5.8 07/18/2016   Lipid Panel     Component Value Date/Time   CHOL 227 (H) 02/14/2016 0955   TRIG 99 02/14/2016 0955   HDL 51 02/14/2016 0955   CHOLHDL 4.5 02/14/2016 0955   VLDL 20 02/14/2016 0955   LDLCALC 156 (H) 02/14/2016 0955        Assessment and plan:   1. Cough  - azithromycin (ZITHROMAX Z-PAK) 250 MG tablet; 2 a day, day one, then one a day for  4 days.  Dispense: 6 each; Refill: 0   Return if symptoms worsen or fail to improve.  The patient was given clear instructions to go to ER or return to medical center if symptoms don't improve, worsen or new problems develop. The patient verbalized understanding.    Henrietta HooverLinda C Rayola Everhart FNP  11/02/2016, 9:22 AM

## 2016-11-05 ENCOUNTER — Other Ambulatory Visit: Payer: Self-pay | Admitting: Family Medicine

## 2016-11-05 ENCOUNTER — Telehealth: Payer: Self-pay

## 2016-11-05 MED ORDER — AMOXICILLIN 500 MG PO CAPS: 500.0000 mg | ORAL_CAPSULE | Freq: Three times a day (TID) | ORAL | 0 refills | Status: DC

## 2016-11-05 NOTE — Telephone Encounter (Signed)
Spoke with patient, he had intense itching all over last week after he took first dose of Zpac. He still has cough and would like a different antibiotic called in. I added Zpac to his allergy list.

## 2016-11-12 ENCOUNTER — Ambulatory Visit: Payer: Self-pay | Admitting: Internal Medicine

## 2016-12-10 ENCOUNTER — Telehealth: Payer: Self-pay | Admitting: Family Medicine

## 2016-12-10 ENCOUNTER — Ambulatory Visit (HOSPITAL_COMMUNITY)
Admission: RE | Admit: 2016-12-10 | Discharge: 2016-12-10 | Disposition: A | Discharge status: Home / Self Care | Payer: Self-pay | Source: Ambulatory Visit | Attending: Family Medicine | Admitting: Family Medicine

## 2016-12-10 ENCOUNTER — Encounter: Payer: Self-pay | Admitting: Family Medicine

## 2016-12-10 ENCOUNTER — Ambulatory Visit (INDEPENDENT_AMBULATORY_CARE_PROVIDER_SITE_OTHER): Payer: Self-pay | Admitting: Family Medicine

## 2016-12-10 ENCOUNTER — Other Ambulatory Visit: Payer: Self-pay | Admitting: Family Medicine

## 2016-12-10 ENCOUNTER — Other Ambulatory Visit: Payer: Self-pay

## 2016-12-10 VITALS — BP 121/82 | HR 77 | Temp 97.8°F | Resp 16 | Ht 74.0 in | Wt 230.0 lb

## 2016-12-10 DIAGNOSIS — R053 Chronic cough: Secondary | ICD-10-CM

## 2016-12-10 DIAGNOSIS — M62830 Muscle spasm of back: Secondary | ICD-10-CM

## 2016-12-10 DIAGNOSIS — R05 Cough: Secondary | ICD-10-CM

## 2016-12-10 DIAGNOSIS — M545 Low back pain, unspecified: Secondary | ICD-10-CM

## 2016-12-10 LAB — CBC WITH DIFFERENTIAL/PLATELET
BASOS ABS: 0 {cells}/uL (ref 0–200)
Basophils Relative: 0 %
EOS PCT: 2 %
Eosinophils Absolute: 126 cells/uL (ref 15–500)
HCT: 41.6 % (ref 38.5–50.0)
Hemoglobin: 13.7 g/dL (ref 13.2–17.1)
Lymphocytes Relative: 58 %
Lymphs Abs: 3654 cells/uL (ref 850–3900)
MCH: 30.1 pg (ref 27.0–33.0)
MCHC: 32.9 g/dL (ref 32.0–36.0)
MCV: 91.4 fL (ref 80.0–100.0)
MONOS PCT: 9 %
MPV: 9 fL (ref 7.5–12.5)
Monocytes Absolute: 567 cells/uL (ref 200–950)
NEUTROS ABS: 1953 {cells}/uL (ref 1500–7800)
Neutrophils Relative %: 31 %
PLATELETS: 380 10*3/uL (ref 140–400)
RBC: 4.55 MIL/uL (ref 4.20–5.80)
RDW: 14.7 % (ref 11.0–15.0)
WBC: 6.3 10*3/uL (ref 3.8–10.8)

## 2016-12-10 LAB — POCT URINALYSIS DIP (DEVICE)
BILIRUBIN URINE: NEGATIVE
Glucose, UA: NEGATIVE mg/dL
Hgb urine dipstick: NEGATIVE
KETONES UR: NEGATIVE mg/dL
LEUKOCYTES UA: NEGATIVE
Nitrite: NEGATIVE
PH: 6 (ref 5.0–8.0)
Protein, ur: NEGATIVE mg/dL
Specific Gravity, Urine: 1.025 (ref 1.005–1.030)
Urobilinogen, UA: 0.2 mg/dL (ref 0.0–1.0)

## 2016-12-10 MED ORDER — CYCLOBENZAPRINE HCL 5 MG PO TABS
5.0000 mg | ORAL_TABLET | Freq: Every day | ORAL | 1 refills | Status: DC
Start: 2016-12-10 — End: 2016-12-10

## 2016-12-10 MED ORDER — PROMETHAZINE-PHENYLEPHRINE 6.25-5 MG/5ML PO SYRP: 5.0000 mL | ORAL_SOLUTION | Freq: Four times a day (QID) | ORAL | 0 refills | Status: DC | PRN

## 2016-12-10 MED ORDER — CYCLOBENZAPRINE HCL 5 MG PO TABS: 5.0000 mg | ORAL_TABLET | Freq: Every day | ORAL | 1 refills | Status: DC

## 2016-12-10 MED FILL — CYCLOBENZAPRINE 5 MG TABLET: 5 | 15 days supply | Qty: 15 | Fill #0

## 2016-12-10 NOTE — Patient Instructions (Signed)
t

## 2016-12-10 NOTE — Progress Notes (Signed)
Subjective:    Patient ID: Todd Carroll, male    DOB: 1/1Steffanie Rainwater3/1977, 41 y.o.   MRN: [id number]  Cough  This is a recurrent (Mr. Barry DienesOwens, a 41 year old male with a history of HIV presents with a persistent cough. He completed antibiotic therapy 3 weeks ago without relief. ) problem. The current episode started 1 to 4 weeks ago. The problem has been gradually worsening. The problem occurs constantly. The cough is non-productive. Associated symptoms include chest pain and myalgias (back pain and back spasms). Pertinent negatives include no ear congestion, ear pain, fever, headaches, heartburn, nasal congestion, postnasal drip, rash, rhinorrhea, sore throat, shortness of breath, sweats, weight loss or wheezing. The symptoms are aggravated by lying down. Risk factors for lung disease include smoking/tobacco exposure. He has tried nothing for the symptoms. The treatment provided no relief. There is no history of asthma, bronchiectasis, bronchitis, COPD, emphysema, environmental allergies or pneumonia.   Past Medical History:  Diagnosis Date  . ALLERGIC RHINITIS 08/27/2007  . GERD 05/04/2008   Social History   Social History  . Marital status: Single    Spouse name: N/A  . Number of children: N/A  . Years of education: N/A   Occupational History  . Not on file.   Social History Main Topics  . Smoking status: Never Smoker  . Smokeless tobacco: Never Used  . Alcohol use No     Comment: social , liquor  . Drug use: No  . Sexual activity: Not Currently    Partners: Female, Male    Birth control/ protection: Condom     Comment: pt declined condoms   Other Topics Concern  . Not on file   Social History Narrative  . No narrative on file   Allergies  Allergen Reactions  . Azithromycin Itching    Review of Systems  Constitutional: Negative for fever and weight loss.  HENT: Negative for ear pain, postnasal drip, rhinorrhea and sore throat.   Respiratory: Positive for cough. Negative  for shortness of breath and wheezing.   Cardiovascular: Positive for chest pain.  Gastrointestinal: Negative for heartburn.  Musculoskeletal: Positive for myalgias (back pain and back spasms).  Skin: Negative for rash.  Allergic/Immunologic: Positive for immunocompromised state (HIV +). Negative for environmental allergies.  Neurological: Negative for headaches.       Objective:   Physical Exam  Constitutional: He is oriented to person, place, and time. He appears well-developed and well-nourished.  HENT:  Head: Normocephalic and atraumatic.  Right Ear: External ear normal.  Left Ear: External ear normal.  Nose: Nose normal.  Mouth/Throat: Oropharynx is clear and moist.  Eyes: Conjunctivae and EOM are normal. Pupils are equal, round, and reactive to light.  Neck: Normal range of motion. Neck supple.  Cardiovascular: Normal rate, normal heart sounds and intact distal pulses.   Pulmonary/Chest: Effort normal and breath sounds normal.  Abdominal: Soft. Bowel sounds are normal.  Musculoskeletal: Normal range of motion.  Lymphadenopathy:       Head (right side): Submental adenopathy present.  Neurological: He is alert and oriented to person, place, and time. He has normal reflexes.  Skin: Skin is warm.  Psychiatric: He has a normal mood and affect. His behavior is normal. Judgment and thought content normal.        BP 121/82 (BP Location: Right Arm, Patient Position: Sitting, Cuff Size: Large)   Pulse 77   Temp 97.8 F (36.6 C) (Oral)   Resp 16   Ht 6\' 2"  (  1.88 m)   Wt 230 lb (104.3 kg)   SpO2 99%   BMI 29.53 kg/m  Assessment & Plan:  1. Persistent cough for 3 weeks or longer - CBC with Differential - DG Chest 2 View; Future - promethazine-phenylephrine (PROMETHAZINE VC PLAIN) 6.25-5 MG/5ML SYRP; Take 5 mLs by mouth every 6 (six) hours as needed for congestion.  Dispense: 280 mL; Refill: 0  2. Muscle spasm of back - cyclobenzaprine (FLEXERIL) 5 MG tablet; Take 1 tablet (5  mg total) by mouth at bedtime.  Dispense: 15 tablet; Refill: 1  3. Low back pain without sciatica, unspecified back pain laterality, unspecified chronicity - POCT urinalysis dip (device)   RTC: PRN  Massie Maroon, FNP

## 2016-12-10 NOTE — Telephone Encounter (Signed)
Reviewed chest xray. No acute cardiopulmonary disease and no evidence of pneumonia. Called patient, phone busy, left message.  Massie MaroonHollis,Cal Gindlesperger M, FNP

## 2016-12-10 NOTE — Telephone Encounter (Signed)
Re-sent medications to patients corrected pharmacy. Thanks!

## 2016-12-11 ENCOUNTER — Ambulatory Visit: Payer: Self-pay | Admitting: Internal Medicine

## 2017-01-08 ENCOUNTER — Ambulatory Visit: Payer: Self-pay | Admitting: Internal Medicine

## 2017-02-13 ENCOUNTER — Ambulatory Visit: Payer: Self-pay | Admitting: Internal Medicine

## 2017-02-18 ENCOUNTER — Other Ambulatory Visit (INDEPENDENT_AMBULATORY_CARE_PROVIDER_SITE_OTHER): Payer: Self-pay

## 2017-02-18 DIAGNOSIS — Z113 Encounter for screening for infections with a predominantly sexual mode of transmission: Secondary | ICD-10-CM

## 2017-02-18 DIAGNOSIS — B20 Human immunodeficiency virus [HIV] disease: Secondary | ICD-10-CM

## 2017-02-18 LAB — CBC WITH DIFFERENTIAL/PLATELET
BASOS ABS: 0 {cells}/uL (ref 0–200)
Basophils Relative: 0 %
EOS ABS: 172 {cells}/uL (ref 15–500)
EOS PCT: 2 %
HCT: 40.4 % (ref 38.5–50.0)
Hemoglobin: 13.3 g/dL (ref 13.2–17.1)
LYMPHS PCT: 29 %
Lymphs Abs: 2494 cells/uL (ref 850–3900)
MCH: 29.6 pg (ref 27.0–33.0)
MCHC: 32.9 g/dL (ref 32.0–36.0)
MCV: 89.8 fL (ref 80.0–100.0)
MONOS PCT: 8 %
MPV: 9.6 fL (ref 7.5–12.5)
Monocytes Absolute: 688 cells/uL (ref 200–950)
NEUTROS ABS: 5246 {cells}/uL (ref 1500–7800)
Neutrophils Relative %: 61 %
PLATELETS: 386 10*3/uL (ref 140–400)
RBC: 4.5 MIL/uL (ref 4.20–5.80)
RDW: 15.3 % — ABNORMAL HIGH (ref 11.0–15.0)
WBC: 8.6 10*3/uL (ref 3.8–10.8)

## 2017-02-18 LAB — COMPLETE METABOLIC PANEL WITH GFR
ALT: 22 U/L (ref 9–46)
AST: 19 U/L (ref 10–40)
Albumin: 4.2 g/dL (ref 3.6–5.1)
Alkaline Phosphatase: 90 U/L (ref 40–115)
BUN: 17 mg/dL (ref 7–25)
CO2: 25 mmol/L (ref 20–31)
Calcium: 9.4 mg/dL (ref 8.6–10.3)
Chloride: 105 mmol/L (ref 98–110)
Creat: 1.04 mg/dL (ref 0.60–1.35)
GFR, EST NON AFRICAN AMERICAN: 89 mL/min (ref >=60)
GLUCOSE: 82 mg/dL (ref 65–99)
POTASSIUM: 4.4 mmol/L (ref 3.5–5.3)
SODIUM: 140 mmol/L (ref 135–146)
Total Bilirubin: 0.4 mg/dL (ref 0.2–1.2)
Total Protein: 7.1 g/dL (ref 6.1–8.1)

## 2017-02-19 LAB — T-HELPER CELL (CD4) - (RCID CLINIC ONLY)
CD4 T CELL ABS: 900 /uL (ref 400–2700)
CD4 T CELL HELPER: 38 % (ref 33–55)

## 2017-02-19 LAB — RPR

## 2017-02-19 LAB — URINE CYTOLOGY ANCILLARY ONLY
Chlamydia: NEGATIVE
NEISSERIA GONORRHEA: NEGATIVE

## 2017-02-20 LAB — HIV-1 RNA QUANT-NO REFLEX-BLD
HIV 1 RNA QUANT: NOT DETECTED {copies}/mL
HIV-1 RNA QUANT, LOG: NOT DETECTED {Log_copies}/mL

## 2017-03-05 ENCOUNTER — Ambulatory Visit: Payer: Self-pay | Admitting: Internal Medicine

## 2017-03-06 ENCOUNTER — Encounter: Payer: Self-pay | Admitting: Internal Medicine

## 2017-03-06 ENCOUNTER — Ambulatory Visit (INDEPENDENT_AMBULATORY_CARE_PROVIDER_SITE_OTHER): Payer: Self-pay | Admitting: Internal Medicine

## 2017-03-06 VITALS — BP 123/90 | HR 97 | Temp 99.2°F | Ht 74.0 in | Wt 227.0 lb

## 2017-03-06 DIAGNOSIS — K219 Gastro-esophageal reflux disease without esophagitis: Secondary | ICD-10-CM

## 2017-03-06 DIAGNOSIS — B20 Human immunodeficiency virus [HIV] disease: Secondary | ICD-10-CM

## 2017-03-06 DIAGNOSIS — Z23 Encounter for immunization: Secondary | ICD-10-CM

## 2017-03-06 DIAGNOSIS — B89 Unspecified parasitic disease: Secondary | ICD-10-CM

## 2017-03-06 MED ORDER — ALBENDAZOLE 200 MG PO TABS: 400.0000 mg | ORAL_TABLET | Freq: Once | ORAL | 0 refills | Status: AC

## 2017-03-06 MED ORDER — BICTEGRAVIR-EMTRICITAB-TENOFOV 50-200-25 MG PO TABS: 1.0000 | ORAL_TABLET | Freq: Every day | ORAL | 11 refills | Status: DC

## 2017-03-06 MED FILL — ALBENZA 200 MG TABLET: 200 | 1 days supply | Qty: 2 | Fill #0

## 2017-03-06 NOTE — Progress Notes (Signed)
rfv: hiv disease follow up  Patient ID: Todd Carroll, male   DOB: 05-Sep-1976, 41 y.o.   MRN: [id number]  HPI Todd Carroll is a 42yo M with hiv disease, GERd, allergic rhinitis, CD 4 count of 900/VL<20 on stribild. He reports having a good adherence. 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 He used nix but is unsure if he has ongoing issues. He states that he has noticed some hairloss for which he is concerned that it is due to his hiv meds.    Outpatient Encounter Prescriptions as of 03/06/2017  Medication Sig  . elvitegravir-cobicistat-emtricitabine-tenofovir (STRIBILD) 150-150-200-300 MG TABS tablet Take 1 tablet by mouth daily with breakfast.  . mometasone (NASONEX) 50 MCG/ACT nasal spray Place 2 sprays into the nose daily.  Marland Kitchen omeprazole (PRILOSEC) 20 MG capsule TAKE 1 CAPSULE BY MOUTH DAILY  . cyclobenzaprine (FLEXERIL) 5 MG tablet Take 1 tablet (5 mg total) by mouth at bedtime. (Patient not taking: Reported on 03/06/2017)  . [DISCONTINUED] promethazine-phenylephrine (PROMETHAZINE VC PLAIN) 6.25-5 MG/5ML SYRP Take 5 mLs by mouth every 6 (six) hours as needed for congestion.   No facility-administered encounter medications on file as of 03/06/2017.      Patient Active Problem List   Diagnosis Date Noted  . Bursitis of elbow 11/01/2014  . 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 Scalp pruritus 07/25/2014  . Itch 05/25/2014  . Rash and nonspecific skin eruption 01/05/2014  . Folliculitis 76/72/0947  . Pain, dental 07/13/2013  . Dysuria 04/29/2013  . Human immunodeficiency virus (HIV) disease ([place]) 02/27/2013  . GERD 05/04/2008  . ALLERGIC RHINITIS 08/27/2007     Health Maintenance Due  Topic Date Due  . TETANUS/TDAP  12/03/2016     Review of Systems + hair loss, and gerd. 12 point ros Physical Exam   BP 123/90   Pulse 97   Temp 99.2 F (37.3 C) (Oral)   Ht 6' 2"  (1.88 m)   Wt 227 lb (103 kg)    BMI 29.15 kg/m   Physical Exam  Constitutional: He is oriented to person, place, and time. He appears well-developed and well-nourished. No distress.  HENT:  Mouth/Throat: Oropharynx is clear and moist. No oropharyngeal exudate.  Cardiovascular: Normal rate, regular rhythm and normal heart sounds. Exam reveals no gallop and no friction rub.  No murmur heard.  Pulmonary/Chest: Effort normal and breath sounds normal. No respiratory distress. He has no wheezes.  Lymphadenopathy:  He has no cervical adenopathy.  Neurological: He is alert and oriented to person, place, and time.  Skin: Skin is warm and dry. No rash noted. No erythema.  Psychiatric: He has a normal mood and affect. His behavior is normal.    Lab Results  Component Value Date   CD4TCELL 38 02/18/2017   Lab Results  Component Value Date   CD4TABS 900 02/18/2017   CD4TABS 860 01/23/2016   CD4TABS 1,440 07/27/2015   Lab Results  Component Value Date   HIV1RNAQUANT <20 NOT DETECTED 02/18/2017   Lab Results  Component Value Date   HEPBSAB NONREACTIVE 03/19/2013   Lab Results  Component Value Date   LABRPR NON REAC 02/18/2017    CBC Lab Results  Component Value Date   WBC 8.6 02/18/2017   RBC 4.50 02/18/2017   HGB 13.3 02/18/2017   HCT 40.4 02/18/2017   PLT 386 02/18/2017   MCV 89.8 02/18/2017   MCH 29.6 02/18/2017  MCHC 32.9 02/18/2017   RDW 15.3 (H) 02/18/2017   LYMPHSABS 2,494 02/18/2017   MONOABS 688 02/18/2017   EOSABS 172 02/18/2017    BMET Lab Results  Component Value Date   NA 140 02/18/2017   K 4.4 02/18/2017   CL 105 02/18/2017   CO2 25 02/18/2017   GLUCOSE 82 02/18/2017   BUN 17 02/18/2017   CREATININE 1.04 02/18/2017   CALCIUM 9.4 02/18/2017   GFRNONAA 89 02/18/2017   GFRAA >89 02/18/2017      Assessment and Plan  hiv disease =looking to take a smaller tab, will switch biktravy  Parasitic infection?= he describes what could be pinworm vs. Ascaris. will have him give stool  kit to bring for ID. Will treat albendazole 446m x 1.  gerd = can use addn dose of omeprazole if needed

## 2017-03-12 MED FILL — CYCLOBENZAPRINE 5 MG TABLET: 5 | 15 days supply | Qty: 15 | Fill #1

## 2017-03-14 ENCOUNTER — Ambulatory Visit (INDEPENDENT_AMBULATORY_CARE_PROVIDER_SITE_OTHER): Payer: Self-pay | Admitting: Family Medicine

## 2017-03-14 ENCOUNTER — Encounter: Payer: Self-pay | Admitting: Family Medicine

## 2017-03-14 VITALS — BP 142/66 | HR 106 | Temp 98.9°F | Resp 16 | Ht 74.0 in | Wt 233.0 lb

## 2017-03-14 DIAGNOSIS — M542 Cervicalgia: Secondary | ICD-10-CM

## 2017-03-14 DIAGNOSIS — M436 Torticollis: Secondary | ICD-10-CM

## 2017-03-14 MED ORDER — METHOCARBAMOL 750 MG PO TABS: 750.0000 mg | ORAL_TABLET | Freq: Three times a day (TID) | ORAL | 0 refills | Status: DC

## 2017-03-14 MED FILL — METHOCARBAMOL 750 MG TABLET: 750 | 10 days supply | Qty: 30 | Fill #0

## 2017-03-14 NOTE — Progress Notes (Signed)
Patient ID: Todd Carroll, male    DOB: 1976/08/31, 41 y.o.   MRN: [id number]  PCP: Massie Maroon, FNP  Chief Complaint  Patient presents with  . Neck Pain    LAYING DOWN AND WHEN TURNING NECK    Subjective:  HPI  Todd Carroll is a 41 y.o. male presents for evaluation of neck pain. Medical problems include: HIV and GERD. Reports that his neck has been stiff for over 1 week. He denies injury or any sudden jerking movements. He was has taken cyclobenzaprine for stiffness without relief. August denies pain. He reports discomfort and inconvenience as he unable to complete turn his head from left to right.  Denies any shoulder or back pain and or any recent upper respiratory illness.   Social History   Social History  . Marital status: Single    Spouse name: N/A  . Number of children: N/A  . Years of education: N/A   Occupational History  . Not on file.   Social History Main Topics  . Smoking status: Never Smoker  . Smokeless tobacco: Never Used  . Alcohol use No     Comment: social , liquor  . Drug use: No  . Sexual activity: Not Currently    Partners: Female, Male    Birth control/ protection: Condom     Comment: pt declined condoms   Other Topics Concern  . Not on file   Social History Narrative  . No narrative on file    History reviewed. No pertinent family history.   Review of Systems See HPI Patient Active Problem List   Diagnosis Date Noted  . Bursitis of elbow 11/01/2014  . 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 Scalp pruritus 07/25/2014  . Itch 05/25/2014  . Rash and nonspecific skin eruption 01/05/2014  . Folliculitis 07/13/2013  . Pain, dental 07/13/2013  . Dysuria 04/29/2013  . Human immunodeficiency virus (HIV) disease ([facility]) 02/27/2013  . GERD 05/04/2008  . ALLERGIC RHINITIS 08/27/2007    Allergies  Allergen Reactions  . Azithromycin Itching    Prior to Admission medications   Medication Sig Start Date End Date Taking?  Authorizing Provider  bictegravir-emtricitabine-tenofovir AF (BIKTARVY) 50-200-25 MG TABS tablet Take 1 tablet by mouth daily. 03/06/17  Yes Judyann Munson, MD  cyclobenzaprine (FLEXERIL) 5 MG tablet Take 1 tablet (5 mg total) by mouth at bedtime. 12/10/16  Yes Massie Maroon, FNP  mometasone (NASONEX) 50 MCG/ACT nasal spray Place 2 sprays into the nose daily.   Yes Historical Provider, MD  omeprazole (PRILOSEC) 20 MG capsule TAKE 1 CAPSULE BY MOUTH DAILY Patient not taking: Reported on 03/14/2017 10/17/16   Judyann Munson, MD    Past Medical, Surgical Family and Social History reviewed and updated.    Objective:   Today's Vitals   03/14/17 1446  BP: (!) 142/66  Pulse: (!) 106  Resp: 16  Temp: 98.9 F (37.2 C)  TempSrc: Oral  SpO2: 95%  Weight: 233 lb (105.7 kg)  Height:  (1.88 m)    Wt Readings from Last 3 Encounters:  03/14/17 233 lb (105.7 kg)  03/06/17 227 lb (103 kg)  12/10/16 230 lb (104.3 kg)    Physical Exam  Constitutional: He is oriented to person, place, and time.  Neck: No spinous process tenderness and no muscular tenderness present. Neck rigidity present. Decreased range of motion present. No edema and no erythema present.  Cardiovascular: Intact distal pulses.   Regular Rhythm-rate tachycardic   Pulmonary/Chest: Effort normal and breath  sounds normal.  Neurological: He is alert and oriented to person, place, and time.  Skin: Skin is warm and dry.  Psychiatric: He has a normal mood and affect. His behavior is normal. Judgment and thought content normal.       Assessment & Plan:  1. Acute muscle stiffness of neck Discontinue Flexeril Start Robaxin 750 mg, three times daily as needed for neck stiffness Obtain a massage to loosen neck  Muscles/tension  RTC if symptoms persists.   Godfrey Pick. Tiburcio Pea, MSN, [facility] [address] [place], [place] [address] [phone]

## 2017-03-25 ENCOUNTER — Ambulatory Visit: Payer: Self-pay | Admitting: Family Medicine

## 2017-03-28 ENCOUNTER — Ambulatory Visit: Payer: Self-pay | Admitting: Family Medicine

## 2017-04-16 ENCOUNTER — Encounter: Payer: Self-pay | Admitting: Internal Medicine

## 2017-05-15 MED FILL — AMOXICILLIN 500 MG CAPSULE: 500 | 10 days supply | Qty: 40 | Fill #0

## 2017-05-15 MED FILL — SF 5000 PLUS CREAM: 1.1 | 21 days supply | Qty: 51 | Fill #0

## 2017-07-02 ENCOUNTER — Other Ambulatory Visit: Payer: Self-pay | Admitting: Internal Medicine

## 2017-07-02 ENCOUNTER — Other Ambulatory Visit: Payer: Self-pay

## 2017-07-02 DIAGNOSIS — B20 Human immunodeficiency virus [HIV] disease: Secondary | ICD-10-CM

## 2017-07-02 DIAGNOSIS — Z79899 Other long term (current) drug therapy: Secondary | ICD-10-CM

## 2017-07-02 DIAGNOSIS — R350 Frequency of micturition: Secondary | ICD-10-CM

## 2017-07-02 LAB — CBC WITH DIFFERENTIAL/PLATELET
BASOS ABS: 0 {cells}/uL (ref 0–200)
Basophils Relative: 0 %
EOS ABS: 158 {cells}/uL (ref 15–500)
EOS PCT: 2 %
HEMATOCRIT: 42.4 % (ref 38.5–50.0)
HEMOGLOBIN: 14.2 g/dL (ref 13.2–17.1)
LYMPHS ABS: 2923 {cells}/uL (ref 850–3900)
Lymphocytes Relative: 37 %
MCH: 30.9 pg (ref 27.0–33.0)
MCHC: 33.5 g/dL (ref 32.0–36.0)
MCV: 92.2 fL (ref 80.0–100.0)
MPV: 9.3 fL (ref 7.5–12.5)
Monocytes Absolute: 553 cells/uL (ref 200–950)
Monocytes Relative: 7 %
NEUTROS ABS: 4266 {cells}/uL (ref 1500–7800)
NEUTROS PCT: 54 %
PLATELETS: 371 10*3/uL (ref 140–400)
RBC: 4.6 MIL/uL (ref 4.20–5.80)
RDW: 14.6 % (ref 11.0–15.0)
WBC: 7.9 10*3/uL (ref 3.8–10.8)

## 2017-07-03 LAB — URINALYSIS, ROUTINE W REFLEX MICROSCOPIC
Bilirubin Urine: NEGATIVE
Glucose, UA: NEGATIVE
Hgb urine dipstick: NEGATIVE
KETONES UR: NEGATIVE
Leukocytes, UA: NEGATIVE
NITRITE: NEGATIVE
PROTEIN: NEGATIVE
Specific Gravity, Urine: 1.016 (ref 1.001–1.035)
pH: 5.5 (ref 5.0–8.0)

## 2017-07-03 LAB — T-HELPER CELL (CD4) - (RCID CLINIC ONLY)
CD4 % Helper T Cell: 36 % (ref 33–55)
CD4 T Cell Abs: 1140 /uL (ref 400–2700)

## 2017-07-03 LAB — COMPREHENSIVE METABOLIC PANEL
ALBUMIN: 4.2 g/dL (ref 3.6–5.1)
ALT: 17 U/L (ref 9–46)
AST: 16 U/L (ref 10–40)
Alkaline Phosphatase: 96 U/L (ref 40–115)
BILIRUBIN TOTAL: 0.2 mg/dL (ref 0.2–1.2)
BUN: 19 mg/dL (ref 7–25)
CALCIUM: 9 mg/dL (ref 8.6–10.3)
CO2: 23 mmol/L (ref 20–31)
CREATININE: 1.28 mg/dL (ref 0.60–1.35)
Chloride: 101 mmol/L (ref 98–110)
Glucose, Bld: 103 mg/dL — ABNORMAL HIGH (ref 65–99)
Potassium: 4.2 mmol/L (ref 3.5–5.3)
Sodium: 138 mmol/L (ref 135–146)
TOTAL PROTEIN: 7.4 g/dL (ref 6.1–8.1)

## 2017-07-03 LAB — LIPID PANEL
CHOLESTEROL: 203 mg/dL — AB (ref <200)
HDL: 50 mg/dL (ref >40)
LDL Cholesterol: 127 mg/dL — ABNORMAL HIGH (ref <100)
TRIGLYCERIDES: 129 mg/dL (ref <150)
Total CHOL/HDL Ratio: 4.1 Ratio (ref <5.0)
VLDL: 26 mg/dL (ref <30)

## 2017-07-03 LAB — RPR

## 2017-07-04 LAB — HIV-1 RNA QUANT-NO REFLEX-BLD
HIV 1 RNA Quant: <20 copies/mL
HIV-1 RNA QUANT, LOG: NOT DETECTED {Log_copies}/mL

## 2017-07-05 ENCOUNTER — Encounter: Payer: Self-pay | Admitting: Internal Medicine

## 2017-07-05 LAB — URINE CYTOLOGY ANCILLARY ONLY
Chlamydia: NEGATIVE
Neisseria Gonorrhea: NEGATIVE
Trichomonas: NEGATIVE

## 2017-07-08 ENCOUNTER — Other Ambulatory Visit: Payer: Self-pay | Admitting: *Deleted

## 2017-07-08 ENCOUNTER — Ambulatory Visit: Payer: Self-pay | Admitting: Internal Medicine

## 2017-07-08 ENCOUNTER — Encounter (INDEPENDENT_AMBULATORY_CARE_PROVIDER_SITE_OTHER): Payer: Self-pay | Admitting: *Deleted

## 2017-07-08 VITALS — BP 101/68 | HR 73 | Temp 97.9°F | Ht 71.5 in | Wt 231.0 lb

## 2017-07-08 DIAGNOSIS — Z006 Encounter for examination for normal comparison and control in clinical research program: Secondary | ICD-10-CM

## 2017-07-08 NOTE — Progress Notes (Signed)
Todd Carroll is here to screen for the Reprieve study. He had a chance to read over the consent at home prior to this visit. We reviewed it together and answered all his questions before he signed the consent. He denies any current problems. [facility] and the only other prescribed medications he is on are omeprazole and flonase.He smokes but doesn't have any other risk factors for heart disease unless he has high cholesterol. The ACA rask score needs to be greater than 5 % to qualify. He got labs drawn last week and needed to see Dr. Drue SecondSnider for followup after switching meds so we scheduled him for an appt. Tomorrow with her. If he is deemed eligible then I will call him back to schedule an entry date.

## 2017-07-09 ENCOUNTER — Encounter: Payer: Self-pay | Admitting: Internal Medicine

## 2017-07-09 ENCOUNTER — Ambulatory Visit (INDEPENDENT_AMBULATORY_CARE_PROVIDER_SITE_OTHER): Payer: Self-pay | Admitting: Internal Medicine

## 2017-07-09 VITALS — BP 123/81 | HR 91 | Temp 98.6°F | Wt 230.0 lb

## 2017-07-09 DIAGNOSIS — R350 Frequency of micturition: Secondary | ICD-10-CM

## 2017-07-09 DIAGNOSIS — Z79899 Other long term (current) drug therapy: Secondary | ICD-10-CM

## 2017-07-09 DIAGNOSIS — B20 Human immunodeficiency virus [HIV] disease: Secondary | ICD-10-CM

## 2017-07-09 LAB — LIPID PANEL
CHOL/HDL RATIO: 4 ratio (ref <5.0)
CHOLESTEROL: 196 mg/dL (ref <200)
HDL: 49 mg/dL (ref >40)
LDL Cholesterol: 120 mg/dL — ABNORMAL HIGH (ref <100)
Triglycerides: 133 mg/dL (ref <150)
VLDL: 27 mg/dL (ref <30)

## 2017-07-09 NOTE — Progress Notes (Signed)
RFV: follow up for hiv disease  Patient ID: Todd RainwaterChristopher M Carroll, male   DOB: [birth year], 41 y.o.   MRN: [id number]  HPI 41yo M with hiv disease, CD 4 count of 1140/VL<20 on biktarvy. He was switched to biktarvy in April 2018 but he just officially switched 2 weeks ago. Frequent urination of late but does drink liquids into the evening. He is concerned about DM given family history. Overall doing well.  Social History  Substance Use Topics  . Smoking status: Never Smoker  . Smokeless tobacco: Never Used  . Alcohol use No     Comment: social , liquor    -  Fhx: DM and prostate ca  Outpatient Encounter Prescriptions as of 07/09/2017  Medication Sig  . bictegravir-emtricitabine-tenofovir AF (BIKTARVY) 50-200-25 MG TABS tablet Take 1 tablet by mouth daily.  . mometasone (NASONEX) 50 MCG/ACT nasal spray Place 2 sprays into the nose daily.  Marland Kitchen. omeprazole (PRILOSEC) 20 MG capsule TAKE 1 CAPSULE BY MOUTH DAILY   No facility-administered encounter medications on file as of 07/09/2017.      Patient Active Problem List   Diagnosis Date Noted  . Bursitis of elbow 11/01/2014  . 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 Scalp pruritus 07/25/2014  . Itch 05/25/2014  . Rash and nonspecific skin eruption 01/05/2014  . Folliculitis 07/13/2013  . Pain, dental 07/13/2013  . Dysuria 04/29/2013  . Human immunodeficiency virus (HIV) disease (HCC) 02/27/2013  . GERD 05/04/2008  . ALLERGIC RHINITIS 08/27/2007     Health Maintenance Due  Topic Date Due  . TETANUS/TDAP  12/03/2016  . INFLUENZA VACCINE  07/03/2017     Review of Systems Other than frequent urination, no discharge, no flank pain, no hematuria, no dysuria. Otherwise 12 point ros is negative Physical Exam   BP 123/81   Pulse 91   Temp 98.6 F (37 C) (Oral)   Wt 230 lb (104.3 kg)   BMI 31.63 kg/m   Physical Exam  Constitutional: He is oriented to person, place, and time. He appears well-developed and well-nourished. No distress.  HENT:   Mouth/Throat: Oropharynx is clear and moist. No oropharyngeal exudate.  Cardiovascular: Normal rate, regular rhythm and normal heart sounds. Exam reveals no gallop and no friction rub.  No murmur heard.  Pulmonary/Chest: Effort normal and breath sounds normal. No respiratory distress. He has no wheezes.  Abdominal: Soft. Bowel sounds are normal. He exhibits no distension. There is no tenderness.  Lymphadenopathy:  He has no cervical adenopathy.  Neurological: He is alert and oriented to person, place, and time.  Skin: Skin is warm and dry. No rash noted. No erythema.  Psychiatric: He has a normal mood and affect. His behavior is normal.    Lab Results  Component Value Date   CD4TCELL 36 07/02/2017   Lab Results  Component Value Date   CD4TABS 1,140 07/02/2017   CD4TABS 900 02/18/2017   CD4TABS 860 01/23/2016   Lab Results  Component Value Date   HIV1RNAQUANT <20 NOT DETECTED 07/02/2017   Lab Results  Component Value Date   HEPBSAB NONREACTIVE 03/19/2013   Lab Results  Component Value Date   LABRPR NON REAC 07/02/2017    CBC Lab Results  Component Value Date   WBC 7.9 07/02/2017   RBC 4.60 07/02/2017   HGB 14.2 07/02/2017   HCT 42.4 07/02/2017   PLT 371 07/02/2017   MCV 92.2 07/02/2017   MCH 30.9 07/02/2017   MCHC 33.5 07/02/2017   RDW 14.6 07/02/2017   LYMPHSABS  2,923 07/02/2017   MONOABS 553 07/02/2017   EOSABS 158 07/02/2017    BMET Lab Results  Component Value Date   NA 138 07/02/2017   K 4.2 07/02/2017   CL 101 07/02/2017   CO2 23 07/02/2017   GLUCOSE 103 (H) 07/02/2017   BUN 19 07/02/2017   CREATININE 1.28 07/02/2017   CALCIUM 9.0 07/02/2017   GFRNONAA 89 02/18/2017   GFRAA >89 02/18/2017      Assessment and Plan  hiv disease =well controlled. Continue on biktarvy. Will check labs at next visit to see how well he is being surpressed with new regimen,  Frequent urination  = discussed less water intake at bedtime. Will check hemoglobin  a1c. Since family hx of DM  ckd2 = stable cr  Long term medication monitoring = kidney function unchanged. No need to change ART

## 2017-07-10 LAB — HEMOGLOBIN A1C
HEMOGLOBIN A1C: 5.8 % — AB (ref <5.7)
Mean Plasma Glucose: 120 mg/dL

## 2017-07-11 ENCOUNTER — Telehealth: Payer: Self-pay | Admitting: *Deleted

## 2017-07-11 NOTE — Telephone Encounter (Signed)
Patient called for his Hgb A1C results. He will contact his PCP at [facility] for appointment for prediabetic counseling. Patient did not have a call back number at this time. Andree CossHowell, Emika Tiano M, RN

## 2017-07-24 ENCOUNTER — Ambulatory Visit (INDEPENDENT_AMBULATORY_CARE_PROVIDER_SITE_OTHER): Payer: Self-pay | Admitting: Family Medicine

## 2017-07-24 ENCOUNTER — Encounter: Payer: Self-pay | Admitting: Family Medicine

## 2017-07-24 VITALS — BP 105/73 | HR 75 | Temp 98.3°F | Resp 16 | Ht 74.0 in | Wt 235.0 lb

## 2017-07-24 DIAGNOSIS — R3589 Other polyuria: Secondary | ICD-10-CM

## 2017-07-24 DIAGNOSIS — Z23 Encounter for immunization: Secondary | ICD-10-CM

## 2017-07-24 DIAGNOSIS — L299 Pruritus, unspecified: Secondary | ICD-10-CM

## 2017-07-24 DIAGNOSIS — R7309 Other abnormal glucose: Secondary | ICD-10-CM

## 2017-07-24 DIAGNOSIS — R358 Other polyuria: Secondary | ICD-10-CM

## 2017-07-24 DIAGNOSIS — R7303 Prediabetes: Secondary | ICD-10-CM

## 2017-07-24 LAB — GLUCOSE, CAPILLARY: GLUCOSE-CAPILLARY: 93 mg/dL (ref 65–99)

## 2017-07-24 LAB — POCT URINALYSIS DIP (DEVICE)
Bilirubin Urine: NEGATIVE
GLUCOSE, UA: NEGATIVE mg/dL
Hgb urine dipstick: NEGATIVE
Ketones, ur: NEGATIVE mg/dL
LEUKOCYTES UA: NEGATIVE
NITRITE: NEGATIVE
Protein, ur: NEGATIVE mg/dL
Specific Gravity, Urine: 1.02 (ref 1.005–1.030)
UROBILINOGEN UA: 0.2 mg/dL (ref 0.0–1.0)
pH: 6 (ref 5.0–8.0)

## 2017-07-24 LAB — POCT GLYCOSYLATED HEMOGLOBIN (HGB A1C): Hemoglobin A1C: 5.7

## 2017-07-24 MED ORDER — HYDROXYZINE HCL 10 MG PO TABS: 10.0000 mg | ORAL_TABLET | Freq: Three times a day (TID) | ORAL | 1 refills | Status: DC | PRN

## 2017-07-24 MED FILL — hydrOXYzine HCL 10 MG TABS: 10 | 10 days supply | Qty: 30 | Fill #0

## 2017-07-24 NOTE — Patient Instructions (Addendum)
Prediabetes Eating Plan  Prediabetes--also called impaired glucose tolerance or impaired fasting glucose--is a condition that causes blood sugar (blood glucose) levels to be higher than normal. Following a healthy diet can help to keep prediabetes under control. It can also help to lower the risk of type 2 diabetes and heart disease, which are increased in people who have prediabetes. Along with regular exercise, a healthy diet:   Promotes weight loss.   Helps to control blood sugar levels.   Helps to improve the way that the body uses insulin.    What do I need to know about this eating plan?   Use the glycemic index (GI) to plan your meals. The index tells you how quickly a food will raise your blood sugar. Choose low-GI foods. These foods take a longer time to raise blood sugar.   Pay close attention to the amount of carbohydrates in the food that you eat. Carbohydrates increase blood sugar levels.   Keep track of how many calories you take in. Eating the right amount of calories will help you to achieve a healthy weight. Losing about 7 percent of your starting weight can help to prevent type 2 diabetes.   You may want to follow a Mediterranean diet. This diet includes a lot of vegetables, lean meats or fish, whole grains, fruits, and healthy oils and fats.  What foods can I eat?  Grains  Whole grains, such as whole-wheat or whole-grain breads, crackers, cereals, and pasta. Unsweetened oatmeal. Bulgur. Barley. Quinoa. Brown rice. Corn or whole-wheat flour tortillas or taco shells.  Vegetables  Lettuce. Spinach. Peas. Beets. Cauliflower. Cabbage. Broccoli. Carrots. Tomatoes. Squash. Eggplant. Herbs. Peppers. Onions. Cucumbers. Brussels sprouts.  Fruits  Berries. Bananas. Apples. Oranges. Grapes. Papaya. Mango. Pomegranate. Kiwi. Grapefruit. Cherries.  Meats and Other Protein Sources  Seafood. Lean meats, such as chicken and turkey or lean cuts of pork and beef. Tofu. Eggs. Nuts. Beans.  Dairy  Low-fat or  fat-free dairy products, such as yogurt, cottage cheese, and cheese.  Beverages  Water. Tea. Coffee. Sugar-free or diet soda. Seltzer water. Milk. Milk alternatives, such as soy or almond milk.  Condiments  Mustard. Relish. Low-fat, low-sugar ketchup. Low-fat, low-sugar barbecue sauce. Low-fat or fat-free mayonnaise.  Sweets and Desserts  Sugar-free or low-fat pudding. Sugar-free or low-fat ice cream and other frozen treats.  Fats and Oils  Avocado. Walnuts. Olive oil.  The items listed above may not be a complete list of recommended foods or beverages. Contact your dietitian for more options.  What foods are not recommended?  Grains  Refined white flour and flour products, such as bread, pasta, snack foods, and cereals.  Beverages  Sweetened drinks, such as sweet iced tea and soda.  Sweets and Desserts  Baked goods, such as cake, cupcakes, pastries, cookies, and cheesecake.  The items listed above may not be a complete list of foods and beverages to avoid. Contact your dietitian for more information.  This information is not intended to replace advice given to you by your health care provider. Make sure you discuss any questions you have with your health care provider.  Document Released: 04/05/2015 Document Revised: 04/26/2016 Document Reviewed: 12/15/2014  Elsevier Interactive Patient Education  2017 Elsevier Inc.  Prediabetes  Prediabetes is the condition of having a blood sugar (blood glucose) level that is higher than it should be, but not high enough for you to be diagnosed with type 2 diabetes. Having prediabetes puts you at risk for developing type 2 diabetes (type   2 diabetes mellitus). Prediabetes may be called impaired glucose tolerance or impaired fasting glucose.  Prediabetes usually does not cause symptoms. Your health care provider can diagnose this condition with blood tests. You may be tested for prediabetes if you are overweight and if you have at least one other risk factor for prediabetes.  Risk  factors for prediabetes include:   Having a family member with type 2 diabetes.   Being overweight or obese.   Being older than age 45.   Being of American-Indian, African-American, Hispanic/Latino, or Asian/Pacific Islander descent.   Having an inactive (sedentary) lifestyle.   Having a history of gestational diabetes or polycystic ovarian syndrome (PCOS).   Having low levels of good cholesterol (HDL-C) or high levels of blood fats (triglycerides).   Having high blood pressure.    What is blood glucose and how is blood glucose measured?    Blood glucose refers to the amount of glucose in your bloodstream. Glucose comes from eating foods that contain sugars and starches (carbohydrates) that the body breaks down into glucose. Your blood glucose level may be measured in mg/dL (milligrams per deciliter) or mmol/L (millimoles per liter).Your blood glucose may be checked with one or more of the following blood tests:   A fasting blood glucose (FBG) test. You will not be allowed to eat (you will fast) for at least 8 hours before a blood sample is taken.  ? A normal range for FBG is 70-100 mg/dl (3.9-5.6 mmol/L).   An A1c (hemoglobin A1c) blood test. This test provides information about blood glucose control over the previous 2?3months.   An oral glucose tolerance test (OGTT). This test measures your blood glucose twice:  ? After fasting. This is your baseline level.  ? Two hours after you drink a beverage that contains glucose.    You may be diagnosed with prediabetes:   If your FBG is 100?125 mg/dL (5.6-6.9 mmol/L).   If your A1c level is 5.7?6.4%.   If your OGGT result is 140?199 mg/dL (7.8-11 mmol/L).    These blood tests may be repeated to confirm your diagnosis.  What happens if blood glucose is too high?  The pancreas produces a hormone (insulin) that helps move glucose from the bloodstream into cells. When cells in the body do not respond properly to insulin that the body makes (insulin resistance),  excess glucose builds up in the blood instead of going into cells. As a result, high blood glucose (hyperglycemia) can develop, which can cause many complications. This is a symptom of prediabetes.  What can happen if blood glucose stays higher than normal for a long time?  Having high blood glucose for a long time is dangerous. Too much glucose in your blood can damage your nerves and blood vessels. Long-term damage can lead to complications from diabetes, which may include:   Heart disease.   Stroke.   Blindness.   Kidney disease.   Depression.   Poor circulation in the feet and legs, which could lead to surgical removal (amputation) in severe cases.    How can prediabetes be prevented from turning into type 2 diabetes?    To help prevent type 2 diabetes, take the following actions:   Be physically active.  ? Do moderate-intensity physical activity for at least 30 minutes on at least 5 days of the week, or as much as told by your health care provider. This could be brisk walking, biking, or water aerobics.  ? Ask your health care provider what   activities are safe for you. A mix of physical activities may be best, such as walking, swimming, cycling, and strength training.   Lose weight as told by your health care provider.  ? Losing 5-7% of your body weight can reverse insulin resistance.  ? Your health care provider can determine how much weight loss is best for you and can help you lose weight safely.   Follow a healthy meal plan. This includes eating lean proteins, complex carbohydrates, fresh fruits and vegetables, low-fat dairy products, and healthy fats.  ? Follow instructions from your health care provider about eating or drinking restrictions.  ? Make an appointment to see a diet and nutrition specialist (registered dietitian) to help you create a healthy eating plan that is right for you.   Do not smoke or use any tobacco products, such as cigarettes, chewing tobacco, and e-cigarettes. If you  need help quitting, ask your health care provider.   Take over-the-counter and prescription medicines as told by your health care provider. You may be prescribed medicines that help lower the risk of type 2 diabetes.    This information is not intended to replace advice given to you by your health care provider. Make sure you discuss any questions you have with your health care provider.  Document Released: 03/12/2016 Document Revised: 04/26/2016 Document Reviewed: 01/10/2016  Elsevier Interactive Patient Education  2018 Elsevier Inc.

## 2017-07-25 ENCOUNTER — Other Ambulatory Visit: Payer: Self-pay | Admitting: Family Medicine

## 2017-07-25 MED FILL — SF 5000 PLUS CREAM: 1.1 | 21 days supply | Qty: 51 | Fill #1

## 2017-07-27 NOTE — Progress Notes (Signed)
Chief Complaint  Patient presents with  . Follow-up    hgba1c high     [name],  is a 41 y.o. male with a history of HIV. He presents for a follow up of prediabetes. He has been following a carbohydrate modified diet. He has not been exercising routinely.    .   ASSESSMENT  Blood pressure 105/73, pulse 75, temperature 98.3 F (36.8 C), temperature source Oral, resp. rate 16, height 6\' 2"  (1.88 m), weight 235 lb (106.6 kg), SpO2 97 %. Body mass index is 30.17 kg/m.  Review of Systems  Constitutional: Negative.   HENT: Negative.   Eyes: Negative.   Respiratory: Negative.   Cardiovascular: Negative.   Gastrointestinal: Negative.   Genitourinary: Negative.   Musculoskeletal: Negative.   Skin: Positive for itching (generalized itching).  Neurological: Negative.   Endo/Heme/Allergies: Negative for polydipsia.  Psychiatric/Behavioral: Negative.    Physical Exam  Constitutional: He is oriented to person, place, and time.  Eyes: Pupils are equal, round, and reactive to light.  Cardiovascular: Normal rate, regular rhythm, normal heart sounds and intact distal pulses.   Pulmonary/Chest: Effort normal and breath sounds normal.  Abdominal: Soft. Bowel sounds are normal.  Neurological: He is alert and oriented to person, place, and time. Gait normal.  Skin: Skin is warm and dry.    Plan:  1. Prediabetes Hemoglobin is 5.7, which is decreased from 6.4 1 year ago. Recommend a lowfat, low carbohydrate diet divided over 5-6 small meals, increase water intake to 6-8 glasses, and 150 minutes per week of cardiovascular exercise.  - Glucose, capillary - POCT urinalysis dip (device)  2. Polyuria Urinalysis unremarkable - POCT urinalysis dip (device)  3. Elevated glucose - HgB A1c - Glucose, capillary  4. Need for Tdap vaccination - Tdap vaccine greater than or equal to 7yo IM  5. Influenza vaccination administered at current visit - Flu Vaccine QUAD 6+ mos PF IM (Fluarix  Quad PF)  6. Pruritus - hydrOXYzine (ATARAX/VISTARIL) 10 MG tablet; Take 1 tablet (10 mg total) by mouth 3 (three) times daily as needed for itching.  Dispense: 30 tablet; Refill: 1 Patient Instructions  Prediabetes Eating Plan Prediabetes-also called impaired glucose tolerance or impaired fasting glucose-is a condition that causes blood sugar (blood glucose) levels to be higher than normal. Following a healthy diet can help to keep prediabetes under control. It can also help to lower the risk of type 2 diabetes and heart disease, which are increased in people who have prediabetes. Along with regular exercise, a healthy diet:  Promotes weight loss.  Helps to control blood sugar levels.  Helps to improve the way that the body uses insulin.  What do I need to know about this eating plan?  Use the glycemic index (GI) to plan your meals. The index tells you how quickly a food will raise your blood sugar. Choose low-GI foods. These foods take a longer time to raise blood sugar.  Pay close attention to the amount of carbohydrates in the food that you eat. Carbohydrates increase blood sugar levels.  Keep track of how many calories you take in. Eating the right amount of calories will help you to achieve a healthy weight. Losing about 7 percent of your starting weight can help to prevent type 2 diabetes.  You may want to follow a Mediterranean diet. This diet includes a lot of vegetables, lean meats or fish, whole grains, fruits, and healthy oils and fats. What foods can I eat? Grains Whole grains,  such as whole-wheat or whole-grain breads, crackers, cereals, and pasta. Unsweetened oatmeal. Bulgur. Barley. Quinoa. Brown rice. Corn or whole-wheat flour tortillas or taco shells. Vegetables Lettuce. Spinach. Peas. Beets. Cauliflower. Cabbage. Broccoli. Carrots. Tomatoes. Squash. Eggplant. Herbs. Peppers. Onions. Cucumbers. Brussels sprouts. Fruits Berries. Bananas. Apples. Oranges. Grapes. Papaya.  Mango. Pomegranate. Kiwi. Grapefruit. Cherries. Meats and Other Protein Sources Seafood. Lean meats, such as chicken and [place] or lean cuts of pork and beef. Tofu. Eggs. Nuts. Beans. Dairy Low-fat or fat-free dairy products, such as yogurt, cottage cheese, and cheese. Beverages Water. Tea. Coffee. Sugar-free or diet soda. Seltzer water. Milk. Milk alternatives, such as soy or almond milk. Condiments Mustard. Relish. Low-fat, low-sugar ketchup. Low-fat, low-sugar barbecue sauce. Low-fat or fat-free mayonnaise. Sweets and Desserts Sugar-free or low-fat pudding. Sugar-free or low-fat ice cream and other frozen treats. Fats and Oils Avocado. Walnuts. Olive oil. The items listed above may not be a complete list of recommended foods or beverages. Contact your dietitian for more options. What foods are not recommended? Grains Refined white flour and flour products, such as bread, pasta, snack foods, and cereals. Beverages Sweetened drinks, such as sweet iced tea and soda. Sweets and Desserts Baked goods, such as cake, cupcakes, pastries, cookies, and cheesecake. The items listed above may not be a complete list of foods and beverages to avoid. Contact your dietitian for more information. This information is not intended to replace advice given to you by your health care provider. Make sure you discuss any questions you have with your health care provider. Document Released: 04/05/2015 Document Revised: 04/26/2016 Document Reviewed: 12/15/2014 Elsevier Interactive Patient Education  2017 Elsevier Inc. Prediabetes Prediabetes is the condition of having a blood sugar (blood glucose) level that is higher than it should be, but not high enough for you to be diagnosed with type 2 diabetes. Having prediabetes puts you at risk for developing type 2 diabetes (type 2 diabetes mellitus). Prediabetes may be called impaired glucose tolerance or impaired fasting glucose. Prediabetes usually does not cause  symptoms. Your health care provider can diagnose this condition with blood tests. You may be tested for prediabetes if you are overweight and if you have at least one other risk factor for prediabetes. Risk factors for prediabetes include:  Having a family member with type 2 diabetes.  Being overweight or obese.  Being older than age 57.  Being of American-Indian, African-American, Hispanic/Latino, or Asian/Pacific Islander descent.  Having an inactive (sedentary) lifestyle.  Having a history of gestational diabetes or polycystic ovarian syndrome (PCOS).  Having low levels of good cholesterol (HDL-C) or high levels of blood fats (triglycerides).  Having high blood pressure.  What is blood glucose and how is blood glucose measured?  Blood glucose refers to the amount of glucose in your bloodstream. Glucose comes from eating foods that contain sugars and starches (carbohydrates) that the body breaks down into glucose. Your blood glucose level may be measured in mg/dL (milligrams per deciliter) or mmol/L (millimoles per liter).Your blood glucose may be checked with one or more of the following blood tests:  A fasting blood glucose (FBG) test. You will not be allowed to eat (you will fast) for at least 8 hours before a blood sample is taken. ? A normal range for FBG is 70-100 mg/dl (7.9-3.9 mmol/L).  An A1c (hemoglobin A1c) blood test. This test provides information about blood glucose control over the previous 2?5months.  An oral glucose tolerance test (OGTT). This test measures your blood glucose twice: ? After fasting.  This is your baseline level. ? Two hours after you drink a beverage that contains glucose.  You may be diagnosed with prediabetes:  If your FBG is 100?125 mg/dL (1.9-1.4 mmol/L).  If your A1c level is 5.7?6.4%.  If your OGGT result is 140?199 mg/dL (7.8-29 mmol/L).  These blood tests may be repeated to confirm your diagnosis. What happens if blood glucose is  too high? The pancreas produces a hormone (insulin) that helps move glucose from the bloodstream into cells. When cells in the body do not respond properly to insulin that the body makes (insulin resistance), excess glucose builds up in the blood instead of going into cells. As a result, high blood glucose (hyperglycemia) can develop, which can cause many complications. This is a symptom of prediabetes. What can happen if blood glucose stays higher than normal for a long time? Having high blood glucose for a long time is dangerous. Too much glucose in your blood can damage your nerves and blood vessels. Long-term damage can lead to complications from diabetes, which may include:  Heart disease.  Stroke.  Blindness.  Kidney disease.  Depression.  Poor circulation in the feet and legs, which could lead to surgical removal (amputation) in severe cases.  How can prediabetes be prevented from turning into type 2 diabetes?  To help prevent type 2 diabetes, take the following actions:  Be physically active. ? Do moderate-intensity physical activity for at least 30 minutes on at least 5 days of the week, or as much as told by your health care provider. This could be brisk walking, biking, or water aerobics. ? Ask your health care provider what activities are safe for you. A mix of physical activities may be best, such as walking, swimming, cycling, and strength training.  Lose weight as told by your health care provider. ? Losing 5-7% of your body weight can reverse insulin resistance. ? Your health care provider can determine how much weight loss is best for you and can help you lose weight safely.  Follow a healthy meal plan. This includes eating lean proteins, complex carbohydrates, fresh fruits and vegetables, low-fat dairy products, and healthy fats. ? Follow instructions from your health care provider about eating or drinking restrictions. ? Make an appointment to see a diet and  nutrition specialist (registered dietitian) to help you create a healthy eating plan that is right for you.  Do not smoke or use any tobacco products, such as cigarettes, chewing tobacco, and e-cigarettes. If you need help quitting, ask your health care provider.  Take over-the-counter and prescription medicines as told by your health care provider. You may be prescribed medicines that help lower the risk of type 2 diabetes.  This information is not intended to replace advice given to you by your health care provider. Make sure you discuss any questions you have with your health care provider. Document Released: 03/12/2016 Document Revised: 04/26/2016 Document Reviewed: 01/10/2016 Elsevier Interactive Patient Education  [employer].

## 2017-08-01 ENCOUNTER — Telehealth: Payer: Self-pay | Admitting: *Deleted

## 2017-08-01 NOTE — Telephone Encounter (Signed)
Patient called, asked if he could walk in and be seen by a doctor, stating he is having discomfort when he urinates.  [facility] states [facility], wanted to come while he is here. RN advised the first appointment available was next week.  Patient asked again to just walk in for a doctor appointment today.  RN reiterated that there was no availability until Monday, advised patient to [facility] (as he is on campus right now) or his PCP at [facility]. Patient disconnected the call. Andree CossHowell, Shaely Gadberry M, RN

## 2017-08-06 ENCOUNTER — Other Ambulatory Visit (INDEPENDENT_AMBULATORY_CARE_PROVIDER_SITE_OTHER): Payer: Self-pay

## 2017-08-06 DIAGNOSIS — R300 Dysuria: Secondary | ICD-10-CM

## 2017-08-06 LAB — POCT URINALYSIS DIP (DEVICE)
BILIRUBIN URINE: NEGATIVE
Glucose, UA: NEGATIVE mg/dL
Hgb urine dipstick: NEGATIVE
Ketones, ur: NEGATIVE mg/dL
Leukocytes, UA: NEGATIVE
NITRITE: NEGATIVE
PH: 6 (ref 5.0–8.0)
PROTEIN: NEGATIVE mg/dL
Specific Gravity, Urine: <=1.005 (ref 1.005–1.030)
Urobilinogen, UA: 0.2 mg/dL (ref 0.0–1.0)

## 2017-08-06 NOTE — Progress Notes (Signed)
Labs pending for dysuria. Patient came in office for labs only visit due to no available appointments in the AM when he needed to be seen. Agreed to allow patient to leave a urine sample will screen for STI and urine culture. UA was unremarkable.  Godfrey PickKimberly S. Tiburcio PeaHarris, MSN, FNP-C The Patient Care [facility]  [address] [place], [place] [address] [phone]

## 2017-08-07 ENCOUNTER — Telehealth: Payer: Self-pay | Admitting: Family Medicine

## 2017-08-07 LAB — GC/CHLAMYDIA PROBE AMP
CT Probe RNA: NOT DETECTED
GC PROBE AMP APTIMA: NOT DETECTED

## 2017-08-07 LAB — URINE CULTURE: ORGANISM ID, BACTERIA: NO GROWTH

## 2017-08-07 NOTE — Telephone Encounter (Signed)
No phone number on file and there is a message in demographics saying "do not send mail to patient's address"

## 2017-08-07 NOTE — Telephone Encounter (Signed)
Contact patient to advise of negative UA, GC, and Chlamydia. If his symptoms persist, he needs to schedule a follow-up with Julianne HandlerLachina Hollis, FNP.

## 2017-08-13 ENCOUNTER — Ambulatory Visit (INDEPENDENT_AMBULATORY_CARE_PROVIDER_SITE_OTHER): Payer: Self-pay | Admitting: Internal Medicine

## 2017-08-13 ENCOUNTER — Encounter: Payer: Self-pay | Admitting: Internal Medicine

## 2017-08-13 ENCOUNTER — Ambulatory Visit: Payer: Self-pay | Admitting: Family Medicine

## 2017-08-13 VITALS — BP 108/74 | HR 187 | Temp 98.6°F | Wt 237.0 lb

## 2017-08-13 DIAGNOSIS — N029 Recurrent and persistent hematuria with unspecified morphologic changes: Secondary | ICD-10-CM

## 2017-08-13 DIAGNOSIS — R110 Nausea: Secondary | ICD-10-CM

## 2017-08-13 DIAGNOSIS — B20 Human immunodeficiency virus [HIV] disease: Secondary | ICD-10-CM

## 2017-08-13 DIAGNOSIS — N341 Nonspecific urethritis: Secondary | ICD-10-CM

## 2017-08-13 MED ORDER — DOXYCYCLINE HYCLATE 100 MG PO TABS: 100.0000 mg | ORAL_TABLET | Freq: Two times a day (BID) | ORAL | 0 refills | Status: DC

## 2017-08-13 MED ORDER — PROMETHAZINE HCL 25 MG PO TABS: 25.0000 mg | ORAL_TABLET | Freq: Four times a day (QID) | ORAL | 0 refills | Status: DC | PRN

## 2017-08-13 NOTE — Progress Notes (Signed)
Patient ID: Todd Carroll, male   DOB: 05-20-1976, 41 y.o.   MRN: [id number]  HPI Todd Carroll is a 41yo M with HIV disease, well controlled with CD 4 count of 1114/VL<20 on biktarvy. He is noticing having blood in his urine but also noticed it with ejaculation the other day. He still reports some sensitivity with urination though was tested for gc/chlam last week which was negative  ROS: MSK pain  Outpatient Encounter Prescriptions as of 08/13/2017  Medication Sig  . bictegravir-emtricitabine-tenofovir AF (BIKTARVY) 50-200-25 MG TABS tablet Take 1 tablet by mouth daily.  . hydrOXYzine (ATARAX/VISTARIL) 10 MG tablet Take 1 tablet (10 mg total) by mouth 3 (three) times daily as needed for itching.  . mometasone (NASONEX) 50 MCG/ACT nasal spray Place 2 sprays into the nose daily.  Marland Kitchen omeprazole (PRILOSEC) 20 MG capsule TAKE 1 CAPSULE BY MOUTH DAILY   No facility-administered encounter medications on file as of 08/13/2017.      Patient Active Problem List   Diagnosis Date Noted  . Bursitis of elbow 11/01/2014  . 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 Scalp pruritus 07/25/2014  . Itch 05/25/2014  . Rash and nonspecific skin eruption 01/05/2014  . Folliculitis 07/13/2013  . Pain, dental 07/13/2013  . Dysuria 04/29/2013  . Human immunodeficiency virus (HIV) disease (HCC) 02/27/2013  . GERD 05/04/2008  . ALLERGIC RHINITIS 08/27/2007     There are no preventive care reminders to display for this patient.   Review of Systems Per hpi Physical Exam   BP 108/74   Pulse (!) 187   Temp 98.6 F (37 C) (Oral)   Wt 237 lb (107.5 kg)   BMI 30.43 kg/m   Physical Exam  Constitutional: He is oriented to person, place, and time. He appears well-developed and well-nourished. No distress.  HENT:  Mouth/Throat: Oropharynx is clear and moist. No oropharyngeal exudate.  Cardiovascular: Normal rate, regular rhythm and normal heart sounds. Exam reveals no gallop and no friction rub.  No murmur  heard.  Pulmonary/Chest: Effort normal and breath sounds normal. No respiratory distress. He has no wheezes.  Abdominal: Soft. Bowel sounds are normal. He exhibits no distension. There is no tenderness.  Lymphadenopathy:  He has no cervical adenopathy.  Neurological: He is alert and oriented to person, place, and time.  Skin: Skin is warm and dry. No rash noted. No erythema.  Psychiatric: He has a normal mood and affect. His behavior is normal.    Lab Results  Component Value Date   CD4TCELL 36 07/02/2017   Lab Results  Component Value Date   CD4TABS 1,140 07/02/2017   CD4TABS 900 02/18/2017   CD4TABS 860 01/23/2016   Lab Results  Component Value Date   HIV1RNAQUANT <20 NOT DETECTED 07/02/2017   Lab Results  Component Value Date   HEPBSAB NONREACTIVE 03/19/2013   Lab Results  Component Value Date   LABRPR NON REAC 07/02/2017    CBC Lab Results  Component Value Date   WBC 7.9 07/02/2017   RBC 4.60 07/02/2017   HGB 14.2 07/02/2017   HCT 42.4 07/02/2017   PLT 371 07/02/2017   MCV 92.2 07/02/2017   MCH 30.9 07/02/2017   MCHC 33.5 07/02/2017   RDW 14.6 07/02/2017   LYMPHSABS 2,923 07/02/2017   MONOABS 553 07/02/2017   EOSABS 158 07/02/2017    BMET Lab Results  Component Value Date   NA 138 07/02/2017   K 4.2 07/02/2017   CL 101 07/02/2017   CO2 23 07/02/2017  GLUCOSE 103 (H) 07/02/2017   BUN 19 07/02/2017   CREATININE 1.28 07/02/2017   CALCIUM 9.0 07/02/2017   GFRNONAA 89 02/18/2017   GFRAA >89 02/18/2017      Assessment and Plan   hiv disease = will check viral load at next visit. 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 Will treat empirically with a dose of aizthromycin 1gm IV x 1, will prescribe zofran to help with se of nausea  Hematuria = will check ua and urine cx  msk pain = recommend topical OTC cream  rtc in 2-3 months to see how he is doing

## 2017-08-14 LAB — URINALYSIS, ROUTINE W REFLEX MICROSCOPIC
BILIRUBIN URINE: NEGATIVE
Bacteria, UA: NONE SEEN /HPF
Glucose, UA: NEGATIVE
Hgb urine dipstick: NEGATIVE
Hyaline Cast: NONE SEEN /LPF
KETONES UR: NEGATIVE
NITRITE: NEGATIVE
PROTEIN: NEGATIVE
RBC / HPF: NONE SEEN /HPF (ref 0–2)
SQUAMOUS EPITHELIAL / LPF: NONE SEEN /HPF (ref <=5)
Specific Gravity, Urine: 1.025 (ref 1.001–1.03)
pH: 6.5 (ref 5.0–8.0)

## 2017-08-14 LAB — URINE CULTURE
MICRO NUMBER: 80999938
Result:: NO GROWTH
SPECIMEN QUALITY:: ADEQUATE

## 2017-08-15 LAB — HIV-1 RNA QUANT-NO REFLEX-BLD
HIV 1 RNA QUANT: NOT DETECTED {copies}/mL
HIV-1 RNA QUANT, LOG: NOT DETECTED {Log_copies}/mL

## 2017-08-19 ENCOUNTER — Telehealth: Payer: Self-pay | Admitting: *Deleted

## 2017-08-19 NOTE — Telephone Encounter (Signed)
Patient called for results of urinalysis/STD screen. He states he still has testicular discomfort off/on, usually about every other day.  He will call PCP to see if he can be worked in there, will see if he needs a specialist referral. Andree Coss, RN

## 2017-09-03 ENCOUNTER — Other Ambulatory Visit: Payer: Self-pay | Admitting: Family Medicine

## 2017-09-03 DIAGNOSIS — L299 Pruritus, unspecified: Secondary | ICD-10-CM

## 2017-09-22 ENCOUNTER — Other Ambulatory Visit: Payer: Self-pay | Admitting: Internal Medicine

## 2017-09-22 DIAGNOSIS — K219 Gastro-esophageal reflux disease without esophagitis: Secondary | ICD-10-CM

## 2017-09-30 MED FILL — SF 5000 PLUS CREAM: 1.1 | 21 days supply | Qty: 51 | Fill #2

## 2017-09-30 MED FILL — hydrOXYzine HCL 10 MG TABS: 10 | 10 days supply | Qty: 30 | Fill #1

## 2017-09-30 MED FILL — CYCLOBENZAPRINE 5 MG TABLET: 5 | 15 days supply | Qty: 15 | Fill #0

## 2017-10-09 ENCOUNTER — Ambulatory Visit (HOSPITAL_COMMUNITY)
Admission: RE | Admit: 2017-10-09 | Discharge: 2017-10-09 | Disposition: A | Discharge status: Home / Self Care | Payer: Self-pay | Source: Ambulatory Visit | Attending: Family Medicine | Admitting: Family Medicine

## 2017-10-09 ENCOUNTER — Ambulatory Visit (INDEPENDENT_AMBULATORY_CARE_PROVIDER_SITE_OTHER): Payer: Self-pay | Admitting: Family Medicine

## 2017-10-09 ENCOUNTER — Encounter: Payer: Self-pay | Admitting: Family Medicine

## 2017-10-09 VITALS — BP 131/88 | HR 80 | Temp 98.8°F | Resp 14 | Ht 74.0 in | Wt 228.0 lb

## 2017-10-09 DIAGNOSIS — M79671 Pain in right foot: Secondary | ICD-10-CM

## 2017-10-09 DIAGNOSIS — S99921A Unspecified injury of right foot, initial encounter: Secondary | ICD-10-CM

## 2017-10-09 DIAGNOSIS — M2011 Hallux valgus (acquired), right foot: Secondary | ICD-10-CM | POA: Insufficient documentation

## 2017-10-09 DIAGNOSIS — M7731 Calcaneal spur, right foot: Secondary | ICD-10-CM | POA: Insufficient documentation

## 2017-10-09 MED ORDER — KETOROLAC TROMETHAMINE 60 MG/2ML IM SOLN
60.0000 mg | Freq: Once | INTRAMUSCULAR | Status: AC
  Administered 2017-10-09: 60 mg via INTRAMUSCULAR

## 2017-10-09 MED ORDER — TRAMADOL HCL 50 MG PO TABS: 50.0000 mg | ORAL_TABLET | Freq: Three times a day (TID) | ORAL | 0 refills | Status: DC | PRN

## 2017-10-09 NOTE — Patient Instructions (Signed)
We will follow-up by phone after reviewing right foot x-ray.  Elevate foot to heart level while at rest.  Apply warm moist compresses to right foot 20 minutes 4 times a day as needed use interchangeably with cool compresses.  Start tramadol 50 mg every 8 hours as needed for moderate to severe pain.  Refrain from drinking, driving, or operating machinery while taking this medication.   Foot Pain Many things can cause foot pain. Some common causes are:  An injury.  A sprain.  Arthritis.  Blisters.  Bunions.  Follow these instructions at home: Pay attention to any changes in your symptoms. Take these actions to help with your discomfort:  If directed, put ice on the affected area: ? Put ice in a plastic bag. ? Place a towel between your skin and the bag. ? Leave the ice on for 15-20 minutes, 3?4 times a day for 2 days.  Take over-the-counter and prescription medicines only as told by your health care provider.  Wear comfortable, supportive shoes that fit you well. Do not wear high heels.  Do not stand or walk for long periods of time.  Do not lift a lot of weight. This can put added pressure on your feet.  Do stretches to relieve foot pain and stiffness as told by your health care provider.  Rub your foot gently.  Keep your feet clean and dry.  Contact a health care provider if:  Your pain does not get better after a few days of self-care.  Your pain gets worse.  You cannot stand on your foot. Get help right away if:  Your foot is numb or tingling.  Your foot or toes are swollen.  Your foot or toes turn white or blue.  You have warmth and redness along your foot. This information is not intended to replace advice given to you by your health care provider. Make sure you discuss any questions you have with your health care provider. Document Released: 12/16/2015 Document Revised: 04/26/2016 Document Reviewed: 12/15/2014 Elsevier Interactive Patient Education   [employer]2018 Elsevier Inc.

## 2017-10-09 NOTE — Progress Notes (Signed)
Subjective:    Todd Carroll is a 41 y.o. male who presents with right foot pain. Patient sustained an injury to right foot on yesterday. He says that he was going up a flight of stairs and sustained a blow to right foot.  Onset of the symptoms was yesterday. Current symptoms include: ability to bear weight, but with some pain, bruising and stiffness. Aggravating factors  lateral movements. Symptoms have been intermittent. Patient has had no prior foot problems.  This is a 6/10.  He last had ibuprofen around 9 PM last night without sustained relief. Past Medical History:  Diagnosis Date  . ALLERGIC RHINITIS 08/27/2007  . GERD 05/04/2008   Social History   Socioeconomic History  . Marital status: Single    Spouse name: Not on file  . Number of children: Not on file  . Years of education: Not on file  . Highest education level: Not on file  Social Needs  . Financial resource strain: Not on file  . Food insecurity - worry: Not on file  . Food insecurity - inability: Not on file  . Transportation needs - medical: Not on file  . Transportation needs - non-medical: Not on file  Occupational History  . Not on file  Tobacco Use  . Smoking status: Never Smoker  . Smokeless tobacco: Never Used  Substance and Sexual Activity  . Alcohol use: Yes    Alcohol/week: 0.5 oz    Types: 1 Standard drinks or equivalent per week    Comment: social , liquor  . Drug use: No  . Sexual activity: Not Currently    Partners: Female, Male    Birth control/protection: Condom    Comment: pt declined condoms  Other Topics Concern  . Not on file  Social History Narrative  . Not on file   Immunization History  Administered Date(s) Administered  . Hepatitis A 04/02/2013  . Hepatitis A, Adult 10/23/2013  . Hepatitis B 04/02/2013, 06/02/2013  . Hepatitis B, adult 01/05/2014  . Influenza Split 09/14/2011  . Influenza,inj,Quad PF,6+ Mos 10/23/2013, 11/01/2014, 11/15/2015, 08/31/2016, 08/31/2016,  07/24/2017  . Meningococcal Mcv4o 03/06/2017  . PPD Test 03/19/2013  . Pneumococcal Polysaccharide-23 03/19/2013  . Td 12/03/2006  . Tdap 07/24/2017  Review of Systems  Constitutional: Negative.   Eyes: Negative.   Respiratory: Negative.   Cardiovascular: Negative.   Genitourinary: Negative.   Musculoskeletal: Positive for myalgias.       Right foot pain  Skin: Negative.   Neurological: Negative.   Psychiatric/Behavioral: Negative.     Objective:    BP 131/88 (BP Location: Right Arm, Patient Position: Sitting, Cuff Size: Normal)   Pulse 80   Temp 98.8 F (37.1 C) (Oral)   Resp 14   Ht 6\' 2"  (1.88 m)   Wt 228 lb (103.4 kg)   SpO2 99%   BMI 29.27 kg/m  Physical Exam  Constitutional: He appears well-developed and well-nourished.  Cardiovascular: Normal rate and regular rhythm.  Pulmonary/Chest: Effort normal and breath sounds normal.  Abdominal: Soft. Bowel sounds are normal.  Musculoskeletal:       Right ankle: He exhibits decreased range of motion. He exhibits no swelling.       Right foot: There is decreased range of motion and tenderness. There is no swelling.   Imaging: X-ray of the right foot for review Assessment:    Non-specific foot pain    Plan:  1. Right foot pain .  A trial of tramadol 50 mg every 8 hours  as needed for moderate-to-severe pain. We will follow-up by phone after reviewing right foot x-ray. - ketorolac (TORADOL) injection 60 mg - DG Foot Complete Right; Future - traMADol (ULTRAM) 50 MG tablet; Take 1 tablet (50 mg total) every 8 (eight) hours as needed by mouth.  Dispense: 15 tablet; Refill: 0  2. Injury of right foot, initial encounter - DG Foot Complete Right; Future    Educational material distributed. Rest, ice, compression, and elevation (RICE) therapy.      RTC: As previously scheduled   The patient was given clear instructions to go to ER or return to medical center if symptoms do not improve, worsen or new problems  develop. The patient verbalized understanding.     Todd NationsLaChina Carroll [name]  MSN, FNP-C Patient Care [facility] [address]  [place], [place] [address] [phone]

## 2017-10-11 ENCOUNTER — Telehealth: Payer: Self-pay

## 2017-10-11 NOTE — Telephone Encounter (Signed)
-----   Message from Massie MaroonLachina M Hollis, [place] sent at 10/10/2017  5:04 PM EST ----- Regarding: Image results Please inform patient that there are no fractures or acute abnormalities.  However he does have some degenerative changes in the foot that includes a tiny calcaneal spur along the Achilles tendon which can which can cause some pain with prolonged standing or with wearing certain shoes.  He may require a podiatry referral if pain continues.  Please follow-up as needed.  Thanks

## 2017-10-11 NOTE — Telephone Encounter (Signed)
I will wait until patient contacts our office regarding results. He does not have a contact number and there is a note in chart to not send mail to address. Thanks!

## 2017-11-05 ENCOUNTER — Ambulatory Visit: Payer: Self-pay

## 2017-11-06 MED FILL — SF 5000 PLUS CREAM: 1.1 | 21 days supply | Qty: 51 | Fill #3

## 2017-11-06 MED FILL — traMADol HCL 50 MG TABS: 50 | 5 days supply | Qty: 15 | Fill #0

## 2017-11-06 MED FILL — ?CYCLOBENZAPRINE 5MG TABLET: 5 | 15 days supply | Qty: 15 | Fill #1

## 2017-11-08 ENCOUNTER — Ambulatory Visit (INDEPENDENT_AMBULATORY_CARE_PROVIDER_SITE_OTHER): Payer: Self-pay | Admitting: Family Medicine

## 2017-11-08 ENCOUNTER — Encounter: Payer: Self-pay | Admitting: Family Medicine

## 2017-11-08 VITALS — BP 130/85 | HR 93 | Temp 98.5°F | Resp 16 | Ht 74.0 in | Wt 240.0 lb

## 2017-11-08 DIAGNOSIS — R300 Dysuria: Secondary | ICD-10-CM

## 2017-11-08 DIAGNOSIS — N489 Disorder of penis, unspecified: Secondary | ICD-10-CM

## 2017-11-08 DIAGNOSIS — F32A Depression, unspecified: Secondary | ICD-10-CM

## 2017-11-08 DIAGNOSIS — N50819 Testicular pain, unspecified: Secondary | ICD-10-CM

## 2017-11-08 DIAGNOSIS — B20 Human immunodeficiency virus [HIV] disease: Secondary | ICD-10-CM

## 2017-11-08 DIAGNOSIS — F329 Major depressive disorder, single episode, unspecified: Secondary | ICD-10-CM

## 2017-11-08 DIAGNOSIS — Z202 Contact with and (suspected) exposure to infections with a predominantly sexual mode of transmission: Secondary | ICD-10-CM

## 2017-11-08 DIAGNOSIS — N341 Nonspecific urethritis: Secondary | ICD-10-CM

## 2017-11-08 LAB — POCT URINALYSIS DIP (DEVICE)
Bilirubin Urine: NEGATIVE
Glucose, UA: NEGATIVE mg/dL
HGB URINE DIPSTICK: NEGATIVE
Ketones, ur: NEGATIVE mg/dL
NITRITE: NEGATIVE
PH: 6 (ref 5.0–8.0)
Protein, ur: NEGATIVE mg/dL
Specific Gravity, Urine: 1.02 (ref 1.005–1.030)
UROBILINOGEN UA: 0.2 mg/dL (ref 0.0–1.0)

## 2017-11-08 MED ORDER — BUSPIRONE HCL 5 MG PO TABS: 5.0000 mg | ORAL_TABLET | Freq: Two times a day (BID) | ORAL | 5 refills | Status: DC

## 2017-11-08 MED ORDER — DOXYCYCLINE HYCLATE 100 MG PO TABS: 100.0000 mg | ORAL_TABLET | Freq: Two times a day (BID) | ORAL | 0 refills | Status: DC

## 2017-11-08 MED FILL — busPIRone HCL 5 MG TABS: 5 | 30 days supply | Qty: 60 | Fill #0

## 2017-11-08 MED FILL — ?DOXYCYCLINE 100MG TABLET: 100 | 10 days supply | Qty: 20 | Fill #0

## 2017-11-08 NOTE — Progress Notes (Signed)
Todd Carroll, a 41 year old patient with a history of HIV presents complaining of dysuria and HIV. Patient is complaining of dysuria and testicular pain over the past several weeks. He says that he has had this issue in the past, which was discussed with infectious disease provider. He is not sexually active at present. He also reports  a small, tender lesion to the tip of his penis. He states that he increased water intake without relief. Testicular pain is located bilaterally and is described as periodic discomfort. He denies penile discharge, rash, or fatigue.   Patient complains of depression. He complains of anhedonia, depressed mood and feelings of worthlessness/guilt. Onset was approximately a few months ago.  He denies current suicidal and homicidal plan or intent.   He has not been treated in the past for depression.   Past Medical History:  Diagnosis Date  . ALLERGIC RHINITIS 08/27/2007  . GERD 05/04/2008   Social History   Socioeconomic History  . Marital status: Single    Spouse name: Not on file  . Number of children: Not on file  . Years of education: Not on file  . Highest education level: Not on file  Social Needs  . Financial resource strain: Not on file  . Food insecurity - worry: Not on file  . Food insecurity - inability: Not on file  . Transportation needs - medical: Not on file  . Transportation needs - non-medical: Not on file  Occupational History  . Not on file  Tobacco Use  . Smoking status: Never Smoker  . Smokeless tobacco: Never Used  Substance and Sexual Activity  . Alcohol use: Yes    Alcohol/week: 0.5 oz    Types: 1 Standard drinks or equivalent per week    Comment: social , liquor  . Drug use: No  . Sexual activity: Not Currently    Partners: Female, Male    Birth control/protection: Condom    Comment: pt declined condoms  Other Topics Concern  . Not on file  Social History Narrative  . Not on file  Review of Systems  Constitutional:  Negative.   HENT: Negative.   Eyes: Negative.  Negative for discharge and redness.  Respiratory: Negative.  Negative for shortness of breath and wheezing.   Cardiovascular: Negative.   Gastrointestinal: Negative.   Genitourinary: Negative.   Musculoskeletal: Negative.   Skin: Negative.   Neurological: Negative.   Endo/Heme/Allergies: Negative.   Psychiatric/Behavioral: Negative.   Physical Exam  Constitutional: He is oriented to person, place, and time.  HENT:  Head: Normocephalic and atraumatic.  Right Ear: External ear normal.  Left Ear: External ear normal.  Nose: Nose normal.  Mouth/Throat: Oropharynx is clear and moist.  Eyes: Pupils are equal, round, and reactive to light.  Neck: Normal range of motion.  Respiratory: Effort normal and breath sounds normal.  GI: Soft. Bowel sounds are normal.  Genitourinary:     Musculoskeletal: Normal range of motion.  Neurological: He is alert and oriented to person, place, and time. He has normal reflexes.  Skin: Skin is warm and dry.  Psychiatric: He has a normal mood and affect. His behavior is normal. Judgment and thought content normal.   Plan  Dysuria Will treat empirically with Doxycycline 100 mg BID.  Will follow up by phone with any abnormal laboratory results.  - GC/Chlamydia Probe Amp - Urine Culture - doxycycline (VIBRA-TABS) 100 MG tablet; Take 1 tablet (100 mg total) by mouth 2 (two) times daily.  Dispense:  20 tablet; Refill: 0 - POCT urinalysis dip (device)  Possible exposure to STD - RPR  Depression, unspecified depression type Depression screen [facility] 2[facility] 11/08/2017 11/08/2017 10/09/2017 08/13/2017 07/24/2017  Decreased Interest 1 1 0 1 0  Down, Depressed, Hopeless 3 0 0 1 0  PHQ - 2 Score 4 1 0 2 0  Altered sleeping 1 - - 0 -  Tired, decreased energy 0 - - 0 -  Change in appetite - - - 0 -  Feeling bad or failure about yourself  1 - - 0 -  Trouble concentrating 1 - - 0 -  Moving slowly or fidgety/restless 0 - -  0 -  Suicidal thoughts 0 - - 0 -  PHQ-9 Score 7 - - 2 -  Difficult doing work/chores Not difficult at all - - Not difficult at all -   Will follow up in 1 month to discuss depression.  - busPIRone (BUSPAR) 5 MG tablet; Take 1 tablet (5 mg total) by mouth 2 (two) times daily.  Dispense: 60 tablet; Refill: 5  Human immunodeficiency virus (HIV) disease (HCC) Follow up with Dr. Drue SecondSnider as scheduled.   Penile lesion - HSV(herpes simplex vrs) 1+2 ab-IgG - POCT urinalysis dip (device)  NGU (nongonococcal urethritis)  - GC/Chlamydia Probe Amp - Urine Culture  Testicular pain - GC/Chlamydia Probe Amp - doxycycline (VIBRA-TABS) 100 MG tablet; Take 1 tablet (100 mg total) by mouth 2 (two) times daily.  Dispense: 20 tablet; Refill: 0   RTC: Will follow up in 1 month for depression. Also, will follow up by phone with any abnormal laboratory results   Nolon NationsLachina Moore Chondra Boyde  MSN, FNP-C Patient Care [facility] [address]  [place], [place] [address] [phone]

## 2017-11-08 NOTE — Patient Instructions (Addendum)
We will treat empirically with doxycycline 100 mg twice daily for urinary tract symptoms.  Will also follow-up by phone with any abnormal laboratory results.  Continue to increase water intake to 6-8 glasses.We will start a trial of BuSpar 5 mg twice daily for symptoms of depression.     Dysuria Dysuria is pain or discomfort while urinating. The pain or discomfort may be felt in the tube that carries urine out of the bladder (urethra) or in the surrounding tissue of the genitals. The pain may also be felt in the groin area, lower abdomen, and lower back. You may have to urinate frequently or have the sudden feeling that you have to urinate (urgency). Dysuria can affect both men and women, but is more common in women. Dysuria can be caused by many different things, including:  Urinary tract infection in women.  Infection of the kidney or bladder.  Kidney stones or bladder stones.  Certain sexually transmitted infections (STIs), such as chlamydia.  Dehydration.  Inflammation of the vagina.  Use of certain medicines.  Use of certain soaps or scented products that cause irritation.  Follow these instructions at home: Watch your dysuria for any changes. The following actions may help to reduce any discomfort you are feeling:  Drink enough fluid to keep your urine clear or pale yellow.  Empty your bladder often. Avoid holding urine for long periods of time.  After a bowel movement or urination, women should cleanse from front to back, using each tissue only once.  Empty your bladder after sexual intercourse.  Take medicines only as directed by your health care provider.  If you were prescribed an antibiotic medicine, finish it all even if you start to feel better.  Avoid caffeine, tea, and alcohol. They can irritate the bladder and make dysuria worse. In men, alcohol may irritate the prostate.  Keep all follow-up visits as directed by your health care provider. This is  important.  If you had any tests done to find the cause of dysuria, it is your responsibility to obtain your test results. Ask the lab or department performing the test when and how you will get your results. Talk with your health care provider if you have any questions about your results.  Contact a health care provider if:  You develop pain in your back or sides.  You have a fever.  You have nausea or vomiting.  You have blood in your urine.  You are not urinating as often as you usually do. Get help right away if:  You pain is severe and not relieved with medicines.  You are unable to hold down any fluids.  You or someone else notices a change in your mental function.  You have a rapid heartbeat at rest.  You have shaking or chills.  You feel extremely weak. This information is not intended to replace advice given to you by your health care provider. Make sure you discuss any questions you have with your health care provider. Document Released: 08/17/2004 Document Revised: 04/26/2016 Document Reviewed: 07/15/2014 Elsevier Interactive Patient Education  [employer]2018 Else[employer] Inc.

## 2017-11-09 LAB — HSV(HERPES SIMPLEX VRS) I + II AB-IGG
HSV 1 GLYCOPROTEIN G AB, IGG: 21.1 {index} — AB (ref 0.00–0.90)
HSV 2 IGG, TYPE SPEC: 7.08 {index} — AB (ref 0.00–0.90)

## 2017-11-09 LAB — RPR: RPR: NONREACTIVE

## 2017-11-10 LAB — URINE CULTURE: Organism ID, Bacteria: NO GROWTH

## 2017-11-13 ENCOUNTER — Other Ambulatory Visit: Payer: Self-pay | Admitting: Family Medicine

## 2017-11-13 ENCOUNTER — Encounter: Payer: Self-pay | Admitting: Family Medicine

## 2017-11-13 ENCOUNTER — Ambulatory Visit (INDEPENDENT_AMBULATORY_CARE_PROVIDER_SITE_OTHER): Payer: Self-pay

## 2017-11-13 ENCOUNTER — Other Ambulatory Visit: Payer: Self-pay

## 2017-11-13 DIAGNOSIS — Z7189 Other specified counseling: Secondary | ICD-10-CM

## 2017-11-13 DIAGNOSIS — A549 Gonococcal infection, unspecified: Secondary | ICD-10-CM

## 2017-11-13 DIAGNOSIS — A6001 Herpesviral infection of penis: Secondary | ICD-10-CM

## 2017-11-13 LAB — GC/CHLAMYDIA PROBE AMP
CHLAMYDIA, DNA PROBE: NEGATIVE
NEISSERIA GONORRHOEAE BY PCR: POSITIVE — AB

## 2017-11-13 MED ORDER — AZITHROMYCIN 500 MG PO TABS: 1000.0000 mg | ORAL_TABLET | Freq: Once | ORAL | 0 refills | Status: AC

## 2017-11-13 MED ORDER — AZITHROMYCIN 500 MG PO TABS: 1000.0000 mg | ORAL_TABLET | Freq: Every day | ORAL | 0 refills | Status: DC

## 2017-11-13 MED ORDER — VALACYCLOVIR HCL 500 MG PO TABS: 500.0000 mg | ORAL_TABLET | Freq: Every day | ORAL | 5 refills | Status: DC

## 2017-11-13 MED ORDER — CEFTRIAXONE SODIUM 250 MG IJ SOLR
250.0000 mg | Freq: Once | INTRAMUSCULAR | Status: AC
  Administered 2017-11-13: 250 mg via INTRAMUSCULAR

## 2017-11-13 MED FILL — VALACYCLOVIR HCL 500 MG TAB: 500 | 30 days supply | Qty: 30 | Fill #0 | Status: TO

## 2017-11-13 NOTE — Progress Notes (Signed)
Todd Carroll, a 41 year old male with a history of HIV presented on 11/08/2017 complaining of dysuria and a penile lesion. Reviewed laboratory results. Positive for neisseria gonorrheae, confirmed by PCR. Patient also positive for herpes simplex type 2. Will start the following medication regimen:   Valacyclovir 500 mg daily for genital herpes Rocephin 250 mg IM times 1 and Azithromycin 1000 g times 1.    Unable to contact patient and patient does not want mail sent to home. Will continue to attempt to contact patient.    Nolon NationsLachina Moore Beauty Pless  MSN, FNP-C Patient Care [facility] [address]  [place], [place] [address] [phone]

## 2017-11-13 NOTE — Progress Notes (Signed)
Meds ordered this encounter  Medications  . valACYclovir (VALTREX) 500 MG tablet    Sig: Take 1 tablet (500 mg total) by mouth daily.    Dispense:  30 tablet    Refill:  5

## 2017-11-13 NOTE — Progress Notes (Signed)
Patient came in office to front window for results. I escorted patient to a room and advised him he was positive for gonorrhea and herpes simplex. Advised that we will treat him with Azithromycin 1000mg  as a single dose along with Rocephin 250mg  injection once to treat the gonorrhea. Also advised that valtrex has been sent in for herpes simplex and to take 1 tablet by mouth once daily to prevent outbreaks. Rocephin was administered in right ventrolateral while patient was in office and advised him to [facility] and [facility]. Patient verbalized understanding. Also advised to start valtrex as directed. Thanks!

## 2017-11-14 MED FILL — AZITHROMYCIN 500 MG TABLET: 500 | 1 days supply | Qty: 2 | Fill #0

## 2017-12-13 ENCOUNTER — Encounter: Payer: Self-pay | Admitting: Internal Medicine

## 2017-12-16 ENCOUNTER — Other Ambulatory Visit: Payer: Self-pay | Admitting: Family Medicine

## 2017-12-16 ENCOUNTER — Ambulatory Visit: Payer: Self-pay | Attending: Internal Medicine

## 2017-12-16 DIAGNOSIS — M79671 Pain in right foot: Secondary | ICD-10-CM

## 2017-12-16 DIAGNOSIS — M62830 Muscle spasm of back: Secondary | ICD-10-CM

## 2017-12-16 MED FILL — ?VALACYCLOVIR HCL 500MG TAB: 500 | 30 days supply | Qty: 30 | Fill #0

## 2017-12-16 MED FILL — busPIRone HCL 5 MG TABS: 5 | 30 days supply | Qty: 60 | Fill #1

## 2017-12-16 NOTE — Telephone Encounter (Signed)
Armeniahina please advise on refill for cyclobenzaprine and tramadol Thanks!

## 2017-12-28 ENCOUNTER — Other Ambulatory Visit: Payer: Self-pay | Admitting: Internal Medicine

## 2017-12-28 DIAGNOSIS — K219 Gastro-esophageal reflux disease without esophagitis: Secondary | ICD-10-CM

## 2018-01-03 ENCOUNTER — Ambulatory Visit (INDEPENDENT_AMBULATORY_CARE_PROVIDER_SITE_OTHER): Payer: Self-pay | Admitting: Family Medicine

## 2018-01-03 ENCOUNTER — Encounter: Payer: Self-pay | Admitting: Family Medicine

## 2018-01-03 VITALS — BP 123/76 | HR 87 | Temp 99.5°F | Resp 16 | Ht 73.0 in | Wt 249.0 lb

## 2018-01-03 DIAGNOSIS — F32A Depression, unspecified: Secondary | ICD-10-CM

## 2018-01-03 DIAGNOSIS — M79671 Pain in right foot: Secondary | ICD-10-CM

## 2018-01-03 DIAGNOSIS — H6690 Otitis media, unspecified, unspecified ear: Secondary | ICD-10-CM

## 2018-01-03 DIAGNOSIS — L219 Seborrheic dermatitis, unspecified: Secondary | ICD-10-CM

## 2018-01-03 DIAGNOSIS — F329 Major depressive disorder, single episode, unspecified: Secondary | ICD-10-CM

## 2018-01-03 MED ORDER — TRAMADOL HCL 50 MG PO TABS: 50.0000 mg | ORAL_TABLET | Freq: Four times a day (QID) | ORAL | 0 refills | Status: DC | PRN

## 2018-01-03 MED ORDER — BUSPIRONE HCL 10 MG PO TABS: 5.0000 mg | ORAL_TABLET | Freq: Two times a day (BID) | ORAL | 5 refills | Status: DC

## 2018-01-03 MED ORDER — KETOROLAC TROMETHAMINE 60 MG/2ML IM SOLN
60.0000 mg | Freq: Once | INTRAMUSCULAR | Status: AC
  Administered 2018-01-03: 60 mg via INTRAMUSCULAR

## 2018-01-03 MED ORDER — ACETAMINOPHEN 500 MG PO TABS: 500.0000 mg | ORAL_TABLET | Freq: Four times a day (QID) | ORAL | 0 refills | Status: DC | PRN

## 2018-01-03 MED ORDER — CIPROFLOXACIN-DEXAMETHASONE 0.3-0.1 % OT SUSP: 4.0000 [drp] | Freq: Two times a day (BID) | OTIC | 0 refills | Status: AC

## 2018-01-03 MED FILL — traMADol HCL 50 MG TABS: 50 | 3 days supply | Qty: 15 | Fill #0

## 2018-01-03 MED FILL — CIPRODEX OTIC SUSPENSION: 0.3-0.1 | 14 days supply | Qty: 8 | Fill #0

## 2018-01-03 NOTE — Patient Instructions (Addendum)
Patient has otitis media to the left ear we will start a trial of Ciprodex 4 drops to left ear twice daily for 7 days  For right foot pain, patient received 60 mg Toradol IM x1 in office without complication.  Will also continue Tylenol 500 mg every 6 hours as needed for mild to moderate right foot pain.  Patient also received a prescription for tramadol 50 mg p.o. #15 every 6 hours as needed for moderate to severe pain. Patient also have a history of depression we will continue BuSpar and increase dose to twice daily.  Patient will follow-up in 3 months for depression.  For dermatitis to left scalp recommend washing hair with Selsun Blue twice weekly.  Also apply olive oil to scalp liberally as needed.   Otitis Media, Adult Otitis media is redness, soreness, and puffiness (swelling) in the space just behind your eardrum (middle ear). It may be caused by allergies or infection. It often happens along with a cold. Follow these instructions at home:  Take your medicine as told. Finish it even if you start to feel better.  Only take over-the-counter or prescription medicines for pain, discomfort, or fever as told by your doctor.  Follow up with your doctor as told. Contact a doctor if:  You have otitis media only in one ear, or bleeding from your nose, or both.  You notice a lump on your neck.  You are not getting better in 3-5 days.  You feel worse instead of better. Get help right away if:  You have pain that is not helped with medicine.  You have puffiness, redness, or pain around your ear.  You get a stiff neck.  You cannot move part of your face (paralysis).  You notice that the bone behind your ear hurts when you touch it. This information is not intended to replace advice given to you by your health care provider. Make sure you discuss any questions you have with your health care provider. Document Released: 05/07/2008 Document Revised: 04/26/2016 Document Reviewed:  06/16/2013 Elsevier Interactive Patient Education  2017 Else[employer] Foot Pain Many things can cause foot pain. Some common causes are:  An injury.  A sprain.  Arthritis.  Blisters.  Bunions.  Follow these instructions at home: Pay attention to any changes in your symptoms. Take these actions to help with your discomfort:  If directed, put ice on the affected area: ? Put ice in a plastic bag. ? Place a towel between your skin and the bag. ? Leave the ice on for 15-20 minutes, 3?4 times a day for 2 days.  Take over-the-counter and prescription medicines only as told by your health care provider.  Wear comfortable, supportive shoes that fit you well. Do not wear high heels.  Do not stand or walk for long periods of time.  Do not lift a lot of weight. This can put added pressure on your feet.  Do stretches to relieve foot pain and stiffness as told by your health care provider.  Rub your foot gently.  Keep your feet clean and dry.  Contact a health care provider if:  Your pain does not get better after a few days of self-care.  Your pain gets worse.  You cannot stand on your foot. Get help right away if:  Your foot is numb or tingling.  Your foot or toes are swollen.  Your foot or toes turn white or blue.  You have warmth and redness along your foot. This information  is not intended to replace advice given to you by your health care provider. Make sure you discuss any questions you have with your health care provider. Document Released: 12/16/2015 Document Revised: 04/26/2016 Document Reviewed: 12/15/2014 Elsevier Interactive Patient Education  [employer].

## 2018-01-03 NOTE — Progress Notes (Signed)
Subjective:    Todd Carroll is a 42 y.o. male who presents for a follow up of right foot pain. Patient sustained an injury to right foot several months ago. He says that he was going up a flight of stairs and sustained a blow to right foot.  A right foot xray was completed, which showed the following:  FINDINGS: Mild hallux valgus deformity. Otherwise normal alignment and mineralization. Tiny calcaneal spur at the insertion of the Achilles tendon. No other significant degenerative change. Regional soft tissues unremarkable. Negative for fracture.  IMPRESSION: 1. Negative for fracture or other acute injury. 2. Mild degenerative change as above  Current symptoms include: ability to bear weight, but with some pain, bruising and stiffness. Aggravating factors  lateral movements. Symptoms have been intermittent. Patient has had no prior foot problems.  This is a 6/10.  He last had ibuprofen around 9 PM last night without sustained relief. Patient was treated conservatively.    Todd Carroll also has a history of depression.  Several months ago he was started on BuSpar 5 mg twice daily for this problem.  Patient has not been taking medication consistently.  He continues to have feelings of hopelessness and anhedonia.  Patient has not been seen by psychology for this problem.  He denies suicidal or homicidal ideations at present.  Patient is also complaining of left ear pain and pressure over the last several days.  Patient states that left ear has been throbbing and he is felt off balance over the past several days.  He denies history of drainage.  He is not attempted any over-the-counter interventions to alleviate this problem.  He does not use Q-tips consistently or insert objects into ears.  He denies fever, headache, ear itching, recent falls, fatigue, or upper respiratory symptoms.  Past Medical History:  Diagnosis Date  . ALLERGIC RHINITIS 08/27/2007  . GERD 05/04/2008   Social History    Socioeconomic History  . Marital status: Single    Spouse name: Not on file  . Number of children: Not on file  . Years of education: Not on file  . Highest education level: Not on file  Social Needs  . Financial resource strain: Not on file  . Food insecurity - worry: Not on file  . Food insecurity - inability: Not on file  . Transportation needs - medical: Not on file  . Transportation needs - non-medical: Not on file  Occupational History  . Not on file  Tobacco Use  . Smoking status: Never Smoker  . Smokeless tobacco: Never Used  Substance and Sexual Activity  . Alcohol use: Yes    Alcohol/week: 0.5 oz    Types: 1 Standard drinks or equivalent per week    Comment: social , liquor  . Drug use: No  . Sexual activity: Not Currently    Partners: Female, Male    Birth control/protection: Condom    Comment: pt declined condoms  Other Topics Concern  . Not on file  Social History Narrative  . Not on file   Immunization History  Administered Date(s) Administered  . Hepatitis A 04/02/2013  . Hepatitis A, Adult 10/23/2013  . Hepatitis B 04/02/2013, 06/02/2013  . Hepatitis B, adult 01/05/2014  . Influenza Split 09/14/2011  . Influenza,inj,Quad PF,6+ Mos 10/23/2013, 11/01/2014, 11/15/2015, 08/31/2016, 08/31/2016, 07/24/2017  . Meningococcal Mcv4o 03/06/2017  . PPD Test 03/19/2013  . Pneumococcal Polysaccharide-23 03/19/2013  . Td 12/03/2006  . Tdap 07/24/2017     Past Medical History:  Diagnosis  Date  . ALLERGIC RHINITIS 08/27/2007  . GERD 05/04/2008   Social History   Socioeconomic History  . Marital status: Single    Spouse name: Not on file  . Number of children: Not on file  . Years of education: Not on file  . Highest education level: Not on file  Social Needs  . Financial resource strain: Not on file  . Food insecurity - worry: Not on file  . Food insecurity - inability: Not on file  . Transportation needs - medical: Not on file  . Transportation needs  - non-medical: Not on file  Occupational History  . Not on file  Tobacco Use  . Smoking status: Never Smoker  . Smokeless tobacco: Never Used  Substance and Sexual Activity  . Alcohol use: Yes    Alcohol/week: 0.5 oz    Types: 1 Standard drinks or equivalent per week    Comment: social , liquor  . Drug use: No  . Sexual activity: Not Currently    Partners: Female, Male    Birth control/protection: Condom    Comment: pt declined condoms  Other Topics Concern  . Not on file  Social History Narrative  . Not on file   Immunization History  Administered Date(s) Administered  . Hepatitis A 04/02/2013  . Hepatitis A, Adult 10/23/2013  . Hepatitis B 04/02/2013, 06/02/2013  . Hepatitis B, adult 01/05/2014  . Influenza Split 09/14/2011  . Influenza,inj,Quad PF,6+ Mos 10/23/2013, 11/01/2014, 11/15/2015, 08/31/2016, 08/31/2016, 07/24/2017  . Meningococcal Mcv4o 03/06/2017  . PPD Test 03/19/2013  . Pneumococcal Polysaccharide-23 03/19/2013  . Td 12/03/2006  . Tdap 07/24/2017  Review of Systems  Constitutional: Negative.   Eyes: Negative.   Respiratory: Negative.   Cardiovascular: Negative.   Genitourinary: Negative.   Musculoskeletal: Positive for myalgias.       Right foot pain  Skin: Negative.        Left scalp irritation  Neurological: Negative.   Psychiatric/Behavioral: Negative.     Objective:    BP 123/76 (BP Location: Right Arm, Patient Position: Sitting, Cuff Size: Large)   Pulse 87   Temp 99.5 F (37.5 C) (Oral)   Resp 16   Ht 6\' 1"  (1.854 m)   Wt 249 lb (112.9 kg)   SpO2 98%   BMI 32.85 kg/m  Physical Exam  Constitutional: He appears well-developed and well-nourished.  Cardiovascular: Normal rate and regular rhythm.  Pulmonary/Chest: Effort normal and breath sounds normal.  Abdominal: Soft. Bowel sounds are normal.  Musculoskeletal:       Right ankle: He exhibits decreased range of motion. He exhibits no swelling.       Right foot: There is decreased  range of motion and tenderness. There is no swelling.  Skin:  0.5 cm, flat, mildly erythematous, nontender area to left scalp in the sagittal region   Assessment:    Non-specific foot pain    Plan:  1. Acute otitis media, unspecified otitis media type We will start a trial of Ciprodex, 4 drops to  left ear twice daily for 7 days. - ciprofloxacin-dexamethasone (CIPRODEX) OTIC suspension; Place 4 drops into the left ear 2 (two) times daily for 7 days.  Dispense: 7.5 mL; Refill: 0  2. Right foot pain Patient has a history of a foot spur, identified on left foot x-ray from 10/10/2017.  Recommend foot stretches, and ice therapy daily as needed.  Recommend Tylenol 500 mg every 6 hours as needed for mild to moderate right foot pain. We will continue  tramadol 50 mg every 6 hours as needed for moderate to severe pain. - traMADol (ULTRAM) 50 MG tablet; Take 1 tablet (50 mg total) by mouth every 6 (six) hours as needed.  Dispense: 15 tablet; Refill: 0 - ketorolac (TORADOL) injection 60 mg - acetaminophen (TYLENOL) 500 MG tablet; Take 1 tablet (500 mg total) by mouth every 6 (six) hours as needed.  Dispense: 30 tablet; Refill: 0  3. Depression, unspecified depression type  - busPIRone (BUSPAR) 10 MG tablet; Take 0.5 tablets (5 mg total) by mouth 2 (two) times daily.  Dispense: 60 tablet; Refill: 5  4.  Seborrheic dermatitis of scalp 0.5 cm flat mildly erythematous area to left scalp is present.  Recommend that patient cleanse the scalp daily with [facility].  Also, apply oil to scalp to prevent drying.    RTC: 3 months for depression    [name]  MSN, FNP-C Patient [facility] [address] [place], [place] [address] [phone]

## 2018-01-07 ENCOUNTER — Other Ambulatory Visit: Payer: Self-pay | Admitting: Family Medicine

## 2018-01-07 DIAGNOSIS — L299 Pruritus, unspecified: Secondary | ICD-10-CM

## 2018-01-07 MED FILL — hydrOXYzine HCL 10 MG TABS: 10 | 10 days supply | Qty: 30 | Fill #0

## 2018-01-08 ENCOUNTER — Telehealth: Payer: Self-pay

## 2018-01-08 MED FILL — busPIRone HCL 10 MG TABS: 10 | 30 days supply | Qty: 30 | Fill #0

## 2018-01-10 MED FILL — ?VALACYCLOVIR HCL 500MG TAB: 500 | 30 days supply | Qty: 30 | Fill #1

## 2018-01-14 MED FILL — SF 5000 PLUS CREAM: 1.1 | 30 days supply | Qty: 51 | Fill #0

## 2018-01-30 ENCOUNTER — Telehealth: Payer: Self-pay

## 2018-01-30 ENCOUNTER — Other Ambulatory Visit: Payer: Self-pay

## 2018-01-30 DIAGNOSIS — L299 Pruritus, unspecified: Secondary | ICD-10-CM

## 2018-01-30 MED ORDER — HYDROXYZINE HCL 10 MG PO TABS: 10.0000 mg | ORAL_TABLET | Freq: Three times a day (TID) | ORAL | 3 refills | Status: DC | PRN

## 2018-01-30 NOTE — Telephone Encounter (Signed)
Sent into pharmacy. Thanks!  

## 2018-01-31 MED FILL — hydrOXYzine HCL 10 MG TABS: 10 | 10 days supply | Qty: 30 | Fill #1

## 2018-02-13 MED FILL — hydrOXYzine HCL 10 MG TABS: 10 | 30 days supply | Qty: 90 | Fill #0

## 2018-02-13 MED FILL — ?VALACYCLOVIR HCL 500MG TAB: 500 | 30 days supply | Qty: 30 | Fill #2

## 2018-02-13 MED FILL — SF 5000 PLUS CREAM: 1.1 | 30 days supply | Qty: 51 | Fill #1

## 2018-02-14 ENCOUNTER — Other Ambulatory Visit: Payer: Self-pay | Admitting: Family Medicine

## 2018-02-14 ENCOUNTER — Telehealth: Payer: Self-pay

## 2018-02-14 DIAGNOSIS — F32A Depression, unspecified: Secondary | ICD-10-CM

## 2018-02-14 DIAGNOSIS — F329 Major depressive disorder, single episode, unspecified: Secondary | ICD-10-CM

## 2018-02-14 MED ORDER — BUSPIRONE HCL 10 MG PO TABS: 5.0000 mg | ORAL_TABLET | Freq: Two times a day (BID) | ORAL | 0 refills | Status: AC

## 2018-02-14 MED ORDER — BUSPIRONE HCL 10 MG PO TABS: 5.0000 mg | ORAL_TABLET | Freq: Two times a day (BID) | ORAL | 5 refills | Status: DC

## 2018-02-14 MED ORDER — BUSPIRONE HCL 5 MG PO TABS: 10.0000 mg | ORAL_TABLET | Freq: Two times a day (BID) | ORAL | 0 refills | Status: DC

## 2018-02-14 MED FILL — busPIRone HCL 5 MG TABS: 5 | 30 days supply | Qty: 60 | Fill #0

## 2018-02-14 NOTE — Telephone Encounter (Signed)
Refill sent into pharmacy. Thanks!  

## 2018-03-19 MED FILL — ?VALACYCLOVIR HCL 500MG TAB: 500 | 30 days supply | Qty: 30 | Fill #3

## 2018-03-19 MED FILL — busPIRone HCL 10 MG TABS: 10 | 30 days supply | Qty: 30 | Fill #0

## 2018-03-19 MED FILL — hydrOXYzine HCL 10 MG TABS: 10 | 30 days supply | Qty: 90 | Fill #1

## 2018-03-19 MED FILL — SF 5000 PLUS CREAM: 1.1 | 30 days supply | Qty: 51 | Fill #2

## 2018-04-02 ENCOUNTER — Ambulatory Visit: Payer: Self-pay | Admitting: Family Medicine

## 2018-04-11 ENCOUNTER — Ambulatory Visit (INDEPENDENT_AMBULATORY_CARE_PROVIDER_SITE_OTHER): Payer: Self-pay | Admitting: Family Medicine

## 2018-04-11 ENCOUNTER — Encounter: Payer: Self-pay | Admitting: Family Medicine

## 2018-04-11 ENCOUNTER — Other Ambulatory Visit: Payer: Self-pay

## 2018-04-11 VITALS — BP 132/86 | HR 105 | Temp 99.3°F | Resp 16 | Ht 73.0 in | Wt 252.0 lb

## 2018-04-11 DIAGNOSIS — M79671 Pain in right foot: Secondary | ICD-10-CM

## 2018-04-11 DIAGNOSIS — B20 Human immunodeficiency virus [HIV] disease: Secondary | ICD-10-CM

## 2018-04-11 DIAGNOSIS — N50819 Testicular pain, unspecified: Secondary | ICD-10-CM

## 2018-04-11 DIAGNOSIS — R7303 Prediabetes: Secondary | ICD-10-CM

## 2018-04-11 DIAGNOSIS — R300 Dysuria: Secondary | ICD-10-CM

## 2018-04-11 DIAGNOSIS — R0789 Other chest pain: Secondary | ICD-10-CM

## 2018-04-11 DIAGNOSIS — G8929 Other chronic pain: Secondary | ICD-10-CM

## 2018-04-11 LAB — POCT URINALYSIS DIPSTICK
BILIRUBIN UA: NEGATIVE
Blood, UA: NEGATIVE
GLUCOSE UA: NEGATIVE
KETONES UA: NEGATIVE
Leukocytes, UA: NEGATIVE
Nitrite, UA: NEGATIVE
PH UA: 6 (ref 5.0–8.0)
Protein, UA: NEGATIVE
SPEC GRAV UA: 1.02 (ref 1.010–1.025)
UROBILINOGEN UA: 0.2 U/dL

## 2018-04-11 LAB — POCT GLYCOSYLATED HEMOGLOBIN (HGB A1C): Hemoglobin A1C: 6

## 2018-04-11 MED ORDER — TRAMADOL HCL 50 MG PO TABS: 50.0000 mg | ORAL_TABLET | Freq: Four times a day (QID) | ORAL | 0 refills | Status: DC | PRN

## 2018-04-11 MED ORDER — MELOXICAM 7.5 MG PO TABS: 7.5000 mg | ORAL_TABLET | Freq: Every day | ORAL | 1 refills | Status: DC

## 2018-04-11 MED ORDER — DOXYCYCLINE HYCLATE 100 MG PO TABS: 100.0000 mg | ORAL_TABLET | Freq: Two times a day (BID) | ORAL | 0 refills | Status: DC

## 2018-04-11 MED FILL — MELOXICAM 7.5 MG TABLET: 7.5 | 30 days supply | Qty: 30 | Fill #0

## 2018-04-11 MED FILL — DOXYCYCLINE HYCLATE 100 MG: 100 | 10 days supply | Qty: 20 | Fill #0

## 2018-04-11 NOTE — Patient Instructions (Signed)
Your hemoglobin A1c is 6.0, which is consistent with prediabetes.  I recommend a low-fat, low-cholesterol diet divided over 5-6 small meals throughout the day.  You have been given an 1800-calorie diet.  Refrain from high-calorie sweetened drinks and only 1 serving of fruit per day. We will follow-up by phone with any abnormal laboratory value We will start a course of doxycycline 100 mg twice daily for skin eruption. For chronic right foot pain, will start meloxicam 7.5 mg daily with food.   We will follow-up in 3 months for repeat EKG   Return in 2 weeks for fasting cholesterol panel

## 2018-04-11 NOTE — Progress Notes (Signed)
Subjective:    Todd Carroll is a 42 y.o. male who presents with right foot pain, chest discomfort, testicular discomfort, and Prediabetes.  . Patient sustained an injury to right foot several months ago He says that he was going up a flight of stairs and sustained a blow to right foot.  Onset of the symptoms was yesterday. Current symptoms include: ability to bear weight, but with some pain, bruising and stiffness. Aggravating factors  lateral movements. Symptoms have been intermittent. Patient has had no prior foot problems.  Patient is requesting pain medication.   Patient also complaining of atypical chest pains. He says that he has been experiencing chest pain and "arm tightness". He denies heart palpitations, shortness of breath, dizziness, syncope, bilateral lower extremity edema, or fatigue. Patient's cardiac risk factors include: obesity and sedentary lifestyle.  Patient is also here for a follow up of prediabetes. He does not exercise routinely or follow a low fat, low carbohydrate diet. Body mass index is 33.25 kg/m. He endorses weight gain. He denies polyuria, polydipsia, or polyphagia . Patient is complaining of dysuria and testicular pain over the past several weeks.  He is not sexually active at present. He also reports  a small, tender lesion to the tip of his penis. He states that he increased water intake without relief. Testicular pain is located bilaterally and is described as periodic discomfort. He denies penile discharge, rash, or fatigue.   Past Medical History:  Diagnosis Date  . ALLERGIC RHINITIS 08/27/2007  . GERD 05/04/2008   Social History   Socioeconomic History  . Marital status: Single    Spouse name: Not on file  . Number of children: Not on file  . Years of education: Not on file  . Highest education level: Not on file  Occupational History  . Not on file  Social Needs  . Financial resource strain: Not on file  . Food insecurity:    Worry: Not on  file    Inability: Not on file  . Transportation needs:    Medical: Not on file    Non-medical: Not on file  Tobacco Use  . Smoking status: Never Smoker  . Smokeless tobacco: Never Used  Substance and Sexual Activity  . Alcohol use: Yes    Alcohol/week: 0.5 oz    Types: 1 Standard drinks or equivalent per week    Comment: social , liquor  . Drug use: No  . Sexual activity: Not Currently    Partners: Female, Male    Birth control/protection: Condom    Comment: pt declined condoms  Lifestyle  . Physical activity:    Days per week: Not on file    Minutes per session: Not on file  . Stress: Not on file  Relationships  . Social connections:    Talks on phone: Not on file    Gets together: Not on file    Attends religious service: Not on file    Active member of club or organization: Not on file    Attends meetings of clubs or organizations: Not on file    Relationship status: Not on file  . Intimate partner violence:    Fear of current or ex partner: Not on file    Emotionally abused: Not on file    Physically abused: Not on file    Forced sexual activity: Not on file  Other Topics Concern  . Not on file  Social History Narrative  . Not on file   Immunization History  Administered Date(s) Administered  . Hepatitis A 04/02/2013  . Hepatitis A, Adult 10/23/2013  . Hepatitis B 04/02/2013, 06/02/2013  . Hepatitis B, adult 01/05/2014  . Influenza Split 09/14/2011  . Influenza,inj,Quad PF,6+ Mos 10/23/2013, 11/01/2014, 11/15/2015, 08/31/2016, 08/31/2016, 07/24/2017  . Meningococcal Mcv4o 03/06/2017  . PPD Test 03/19/2013  . Pneumococcal Polysaccharide-23 03/19/2013  . Td 12/03/2006  . Tdap 07/24/2017  Review of Systems  Constitutional: Negative.   HENT: Negative.   Eyes: Negative.   Respiratory: Negative.  Negative for sputum production and shortness of breath.   Cardiovascular: Positive for chest pain and palpitations.  Gastrointestinal: Negative.   Genitourinary:  Positive for dysuria.  Musculoskeletal: Positive for myalgias.       Right foot pain  Skin: Positive for rash.  Neurological: Negative.   Endo/Heme/Allergies: Negative.   Psychiatric/Behavioral: Negative.     Objective:    BP 132/86 (BP Location: Right Arm, Patient Position: Sitting, Cuff Size: Normal)   Pulse (!) 105   Temp 99.3 F (37.4 C) (Oral)   Resp 16   Ht  (1.854 m)   Wt 252 lb (114.3 kg)   SpO2 97%   BMI 33.25 kg/m  Physical Exam  Constitutional: He appears well-developed and well-nourished.  Cardiovascular: Normal rate and regular rhythm.  Pulmonary/Chest: Effort normal and breath sounds normal.  Abdominal: Soft. Bowel sounds are normal.  Musculoskeletal:       Right ankle: He exhibits decreased range of motion. He exhibits no swelling.       Right foot: There is decreased range of motion and tenderness. There is no swelling.   Assessment:     BP 132/86 (BP Location: Right Arm, Patient Position: Sitting, Cuff Size: Normal)   Pulse (!) 105   Temp 99.3 F (37.4 C) (Oral)   Resp 16   Ht  (1.854 m)   Wt 252 lb (114.3 kg)   SpO2 97%   BMI 33.25 kg/m   Plan:  1. Testicular discomfort - doxycycline (VIBRA-TABS) 100 MG tablet; Take 1 tablet (100 mg total) by mouth 2 (two) times daily.  Dispense: 20 tablet; Refill: 0 - GC/Chlamydia Probe Amp([facility]) - Urinalysis Dipstick  2. Dysuria - doxycycline (VIBRA-TABS) 100 MG tablet; Take 1 tablet (100 mg total) by mouth 2 (two) times daily.  Dispense: 20 tablet; Refill: 0 - GC/Chlamydia Probe Amp([facility]) - Urinalysis Dipstick  3. Human immunodeficiency virus (HIV) disease (HCC) Continue medication regimen as prescribed  4. Chronic foot pain, right - meloxicam (MOBIC) 7.5 MG tablet; Take 1 tablet (7.5 mg total) by mouth daily.  Dispense: 30 tablet; Refill: 1 - traMADol (ULTRAM) 50 MG tablet; Take 1 tablet (50 mg total) by mouth every 6 (six) hours as needed.  Dispense: 15 tablet; Refill: 0  5. Chest  discomfort - EKG 12-Lead  6. Prediabetes Hemoglobin a1C 6.0. Recommend a lowfat, low carbohydrate diet divided over 5-6 small meals, increase water intake to 6-8 glasses, and 150 minutes per week of cardiovascular exercise.   - HgB A1c    The patient was given clear instructions to go to ER or return to medical center if symptoms do not improve, worsen or new problems develop. The patient verbalized understanding.      [name]  MSN, FNP-C Patient [facility] [address] [place], [place] [address] [phone]

## 2018-04-12 LAB — GC/CHLAMYDIA PROBE AMP
Chlamydia trachomatis, NAA: NEGATIVE
NEISSERIA GONORRHOEAE BY PCR: NEGATIVE

## 2018-04-23 ENCOUNTER — Other Ambulatory Visit: Payer: Self-pay

## 2018-04-23 ENCOUNTER — Telehealth: Payer: Self-pay

## 2018-04-23 DIAGNOSIS — K219 Gastro-esophageal reflux disease without esophagitis: Secondary | ICD-10-CM

## 2018-04-23 MED ORDER — OMEPRAZOLE 20 MG PO CPDR: 20.0000 mg | DELAYED_RELEASE_CAPSULE | Freq: Every day | ORAL | 0 refills | Status: DC

## 2018-04-23 MED ORDER — BICTEGRAVIR-EMTRICITAB-TENOFOV 50-200-25 MG PO TABS: 1.0000 | ORAL_TABLET | Freq: Every day | ORAL | 0 refills | Status: DC

## 2018-04-23 NOTE — Telephone Encounter (Signed)
Pt called today requesting a refill on Biktarvy and Omeprazole. Pt stated that he is currently out of meds and has been out of meds for two days. Refill was sent with no additional refills pt is aware he must come to the clinic for more refills.  Lorenso Courier, [place]

## 2018-04-24 MED FILL — SF 5000 PLUS CREAM: 1.1 | 30 days supply | Qty: 51 | Fill #3

## 2018-04-24 MED FILL — hydrOXYzine HCL 10 MG TABS: 10 | 30 days supply | Qty: 90 | Fill #2

## 2018-04-24 MED FILL — ?VALACYCLOVIR HCL 500MG TAB: 500 | 30 days supply | Qty: 30 | Fill #4

## 2018-04-25 ENCOUNTER — Other Ambulatory Visit: Payer: Self-pay

## 2018-04-29 ENCOUNTER — Other Ambulatory Visit: Payer: Self-pay

## 2018-04-29 DIAGNOSIS — B20 Human immunodeficiency virus [HIV] disease: Secondary | ICD-10-CM

## 2018-04-30 ENCOUNTER — Other Ambulatory Visit: Payer: Self-pay

## 2018-04-30 LAB — CBC WITH DIFFERENTIAL/PLATELET
BASOS ABS: 29 {cells}/uL (ref 0–200)
Basophils Relative: 0.4 %
EOS ABS: 168 {cells}/uL (ref 15–500)
Eosinophils Relative: 2.3 %
HEMATOCRIT: 41.9 % (ref 38.5–50.0)
Hemoglobin: 14.3 g/dL (ref 13.2–17.1)
LYMPHS ABS: 3285 {cells}/uL (ref 850–3900)
MCH: 30.4 pg (ref 27.0–33.0)
MCHC: 34.1 g/dL (ref 32.0–36.0)
MCV: 89.1 fL (ref 80.0–100.0)
MPV: 9.3 fL (ref 7.5–12.5)
Monocytes Relative: 9.2 %
NEUTROS PCT: 43.1 %
Neutro Abs: 3146 cells/uL (ref 1500–7800)
Platelets: 424 10*3/uL — ABNORMAL HIGH (ref 140–400)
RBC: 4.7 10*6/uL (ref 4.20–5.80)
RDW: 13 % (ref 11.0–15.0)
TOTAL LYMPHOCYTE: 45 %
WBC: 7.3 10*3/uL (ref 3.8–10.8)
WBCMIX: 672 {cells}/uL (ref 200–950)

## 2018-04-30 LAB — COMPLETE METABOLIC PANEL WITH GFR
AG RATIO: 1.3 (calc) (ref 1.0–2.5)
ALBUMIN MSPROF: 4.3 g/dL (ref 3.6–5.1)
ALT: 20 U/L (ref 9–46)
AST: 19 U/L (ref 10–40)
Alkaline phosphatase (APISO): 73 U/L (ref 40–115)
BILIRUBIN TOTAL: 0.4 mg/dL (ref 0.2–1.2)
BUN: 16 mg/dL (ref 7–25)
CALCIUM: 9.6 mg/dL (ref 8.6–10.3)
CHLORIDE: 104 mmol/L (ref 98–110)
CO2: 26 mmol/L (ref 20–32)
Creat: 1.07 mg/dL (ref 0.60–1.35)
GFR, EST AFRICAN AMERICAN: 99 mL/min/{1.73_m2} (ref >=60)
GFR, EST NON AFRICAN AMERICAN: 86 mL/min/{1.73_m2} (ref >=60)
Globulin: 3.3 g/dL (calc) (ref 1.9–3.7)
Glucose, Bld: 90 mg/dL (ref 65–99)
POTASSIUM: 4.4 mmol/L (ref 3.5–5.3)
Sodium: 137 mmol/L (ref 135–146)
Total Protein: 7.6 g/dL (ref 6.1–8.1)

## 2018-04-30 LAB — T-HELPER CELL (CD4) - (RCID CLINIC ONLY)
CD4 % Helper T Cell: 33 % (ref 33–55)
CD4 T Cell Abs: 1090 /uL (ref 400–2700)

## 2018-05-01 ENCOUNTER — Other Ambulatory Visit: Payer: Self-pay

## 2018-05-01 LAB — HIV-1 RNA QUANT-NO REFLEX-BLD
HIV 1 RNA Quant: <20 copies/mL
HIV-1 RNA Quant, Log: <1.3 Log copies/mL

## 2018-05-15 ENCOUNTER — Other Ambulatory Visit: Payer: Self-pay | Admitting: *Deleted

## 2018-05-15 ENCOUNTER — Encounter: Payer: Self-pay | Admitting: Internal Medicine

## 2018-05-15 ENCOUNTER — Telehealth: Payer: Self-pay

## 2018-05-15 ENCOUNTER — Ambulatory Visit (INDEPENDENT_AMBULATORY_CARE_PROVIDER_SITE_OTHER): Payer: Self-pay | Admitting: Licensed Clinical Social Worker

## 2018-05-15 ENCOUNTER — Ambulatory Visit (INDEPENDENT_AMBULATORY_CARE_PROVIDER_SITE_OTHER): Payer: Self-pay | Admitting: Internal Medicine

## 2018-05-15 VITALS — BP 142/85 | HR 85 | Temp 98.6°F | Wt 247.0 lb

## 2018-05-15 DIAGNOSIS — A630 Anogenital (venereal) warts: Secondary | ICD-10-CM

## 2018-05-15 DIAGNOSIS — R4586 Emotional lability: Secondary | ICD-10-CM

## 2018-05-15 DIAGNOSIS — F331 Major depressive disorder, recurrent, moderate: Secondary | ICD-10-CM

## 2018-05-15 DIAGNOSIS — B20 Human immunodeficiency virus [HIV] disease: Secondary | ICD-10-CM

## 2018-05-15 DIAGNOSIS — Z23 Encounter for immunization: Secondary | ICD-10-CM

## 2018-05-15 LAB — LIPID PANEL
CHOL/HDL RATIO: 4.3 (calc) (ref <5.0)
Cholesterol: 227 mg/dL — ABNORMAL HIGH (ref <200)
HDL: 53 mg/dL (ref >40)
LDL CHOLESTEROL (CALC): 140 mg/dL — AB
NON-HDL CHOLESTEROL (CALC): 174 mg/dL — AB (ref <130)
Triglycerides: 207 mg/dL — ABNORMAL HIGH (ref <150)

## 2018-05-15 MED ORDER — HYDROCORTISONE 1 % EX OINT: 1.0000 "application " | TOPICAL_OINTMENT | Freq: Two times a day (BID) | CUTANEOUS | 0 refills | Status: DC

## 2018-05-15 MED ORDER — IMIQUIMOD 5 % EX CREA: TOPICAL_CREAM | CUTANEOUS | 0 refills | Status: DC

## 2018-05-15 NOTE — Patient Instructions (Signed)
Return in 1 month for 2nd HPV

## 2018-05-15 NOTE — Telephone Encounter (Signed)
Pt came back today to our office after [facility] informed pt that Dr. Feliz BeamSnider's medication was not sent to the pharmacy. Pt stated that he would prefer to have this medications sent to them after being [facility]. Was able to fix this for pt with meds being sent to preferred pharmacy.  Called [facility] to cancel prescriptions for Aldara cream and Hydrocortisone ointment.  Lorenso CourierJose L Maldonado, [place]

## 2018-05-15 NOTE — Progress Notes (Signed)
RFV: follow up for hiv disease  Patient ID: Todd Carroll, male   DOB: May 31, 1976, 42 y.o.   MRN: [id number]  HPI Todd Carroll is a 42yo M with hiv disease, CD 4 count of 1090/VL<20 in biktarvy. Last seen in Sep 2018. Also follows up with his PCP. Since we last saw him, he was treated for ngu as well  as GC. Currently, not having any symptoms. He is doing well with his health. He reports that sometimes he notices his mood changing quickly, a few hours of happiness then followed feeling down.   Outpatient Encounter Medications as of 05/15/2018  Medication Sig  . acetaminophen (TYLENOL) 500 MG tablet Take 1 tablet (500 mg total) by mouth every 6 (six) hours as needed.  . bictegravir-emtricitabine-tenofovir AF (BIKTARVY) 50-200-25 MG TABS tablet Take 1 tablet by mouth daily.  Marland Kitchen doxycycline (VIBRA-TABS) 100 MG tablet Take 1 tablet (100 mg total) by mouth 2 (two) times daily.  . hydrOXYzine (ATARAX/VISTARIL) 10 MG tablet TAKE 1 TABLET (10 MG TOTAL) BY MOUTH 3 (THREE) TIMES DAILY AS NEEDED FOR ITCHING.  . hydrOXYzine (ATARAX/VISTARIL) 10 MG tablet Take 1 tablet (10 mg total) by mouth 3 (three) times daily as needed for itching.  . meloxicam (MOBIC) 7.5 MG tablet Take 1 tablet (7.5 mg total) by mouth daily.  . mometasone (NASONEX) 50 MCG/ACT nasal spray Place 2 sprays into the nose daily.  Marland Kitchen omeprazole (PRILOSEC) 20 MG capsule Take 1 capsule (20 mg total) by mouth daily.  . traMADol (ULTRAM) 50 MG tablet Take 1 tablet (50 mg total) by mouth every 6 (six) hours as needed.   No facility-administered encounter medications on file as of 05/15/2018.      Patient Active Problem List   Diagnosis Date Noted  . Bursitis of elbow 11/01/2014  . 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 Scalp pruritus 07/25/2014  . Itch 05/25/2014  . Rash and nonspecific skin eruption 01/05/2014  . Folliculitis 07/13/2013  . Pain, dental 07/13/2013  . Dysuria 04/29/2013  . Human immunodeficiency virus (HIV) disease (HCC) 02/27/2013   . GERD 05/04/2008  . ALLERGIC RHINITIS 08/27/2007     There are no preventive care reminders to display for this patient.   Review of Systems Review of Systems  Constitutional: Negative for fever, chills, diaphoresis, activity change, appetite change, fatigue and unexpected weight change.  HENT: Negative for congestion, sore throat, rhinorrhea, sneezing, trouble swallowing and sinus pressure.  Eyes: Negative for photophobia and visual disturbance.  Respiratory: Negative for cough, chest tightness, shortness of breath, wheezing and stridor.  Cardiovascular: Negative for chest pain, palpitations and leg swelling.  Gastrointestinal: Negative for nausea, vomiting, abdominal pain, diarrhea, constipation, blood in stool, abdominal distention and anal bleeding.  Genitourinary: Negative for dysuria, hematuria, flank pain and difficulty urinating.  Musculoskeletal: Negative for myalgias, back pain, joint swelling, arthralgias and gait problem.  Skin: Negative for color change, pallor, rash and wound.  Neurological: Negative for dizziness, tremors, weakness and light-headedness.  Hematological: Negative for adenopathy. Does not bruise/bleed easily.  Psychiatric/Behavioral: Negative for behavioral problems, confusion, sleep disturbance, dysphoric mood, decreased concentration and agitation.    Physical Exam   BP (!) 142/85   Pulse 85   Temp 98.6 F (37 C) (Oral)   Wt 247 lb (112 kg)   BMI 32.59 kg/m   Physical Exam  Constitutional: He is oriented to person, place, and time. He appears well-developed and well-nourished. No distress.  HENT:  Mouth/Throat: Oropharynx is clear and moist. No oropharyngeal exudate.  Cardiovascular:  Normal rate, regular rhythm and normal heart sounds. Exam reveals no gallop and no friction rub.  No murmur heard.  Pulmonary/Chest: Effort normal and breath sounds normal. No respiratory distress. He has no wheezes.  Abdominal: Soft. Bowel sounds are normal. He  exhibits no distension. There is no tenderness.  Lymphadenopathy:  He has no cervical adenopathy.  gu = perianal wart, single pedunculated lesion. Neurological: He is alert and oriented to person, place, and time.  Skin: Skin is warm and dry. No rash noted. No erythema.  Psychiatric: He has a normal mood and affect. His behavior is normal.    Lab Results  Component Value Date   CD4TCELL 33 04/29/2018   Lab Results  Component Value Date   CD4TABS 1,090 04/29/2018   CD4TABS 1,140 07/02/2017   CD4TABS 900 02/18/2017   Lab Results  Component Value Date   HIV1RNAQUANT <20 NOT DETECTED 04/29/2018   Lab Results  Component Value Date   HEPBSAB NONREACTIVE 03/19/2013   Lab Results  Component Value Date   LABRPR Non Reactive 11/08/2017    CBC Lab Results  Component Value Date   WBC 7.3 04/29/2018   RBC 4.70 04/29/2018   HGB 14.3 04/29/2018   HCT 41.9 04/29/2018   PLT 424 (H) 04/29/2018   MCV 89.1 04/29/2018   MCH 30.4 04/29/2018   MCHC 34.1 04/29/2018   RDW 13.0 04/29/2018   LYMPHSABS 3,285 04/29/2018   MONOABS 553 07/02/2017   EOSABS 168 04/29/2018    BMET Lab Results  Component Value Date   NA 137 04/29/2018   K 4.4 04/29/2018   CL 104 04/29/2018   CO2 26 04/29/2018   GLUCOSE 90 04/29/2018   BUN 16 04/29/2018   CREATININE 1.07 04/29/2018   CALCIUM 9.6 04/29/2018   GFRNONAA 86 04/29/2018   GFRAA 99 04/29/2018      Assessment and Plan  Mood swing = have regina, clinic counselor talking to him. No hx of taking ssri. Patient wants to do trial of counseling before starting ssri.  hpv lesion = will give a trial of imiquiod  hiv = continue on biktarvy, check lipids, gc/chlam screening  Health maintenance =will do hpv and prevnar 13.

## 2018-05-16 LAB — CYTOLOGY, (ORAL, ANAL, URETHRAL) ANCILLARY ONLY
Chlamydia: NEGATIVE
Chlamydia: NEGATIVE
NEISSERIA GONORRHEA: NEGATIVE
Neisseria Gonorrhea: NEGATIVE

## 2018-05-16 NOTE — BH Specialist Note (Signed)
Integrated Behavioral Health Initial Visit  MRN: [id number] Name: Todd Carroll  Number of Integrated Behavioral Health Clinician visits:: 1/6 Session Start time: 10:22am  Session End time: 10:39am Total time: 15 minutes  Type of Service: Integrated Behavioral Health- Individual/Family Interpretor:No. Interpretor Name and Language:n/a   Warm Hand Off Completed.       SUBJECTIVE: Todd Carroll is a 42 y.o. male accompanied by self Patient was referred by Dr. Drue SecondSnider for mood swings. Patient reports the following symptoms/concerns: sometimes has periods where he is extremely happy and social, other times does not want to leave his room and is irritable Duration of problem: 5 months; Severity of problem: moderate  OBJECTIVE: Mood: Euthymic and Affect: Irritable Risk of harm to self or others: No plan to harm self or others  LIFE CONTEXT: Patient reports life is going fine, there have been no big changes or upsets recently. However, for the past 5 months or so he has had changes in mood and energy a couple of times a day. This does not impact his relationships or level of functioning, but patient reports that it is very bothersome to him. He indicates that he mentioned it to his family physician last month and she thought it might be Bipolar Disorder but recommended that he speak to someone in the mental health field. He reports a history of some depressed mood, and was prescribed Buspar for this but did not take it regularly and now is not taking it at all. Patient states that he does not want to go out or see/talk to friends unless he is in the "up" mood, and when he is "down" does not want to get out of bed or be around anyone at all. He has periods of "blah" between the two more extreme moods. Moods last 2-4 hours at a time.   GOALS ADDRESSED: Patient will: 1. Reduce symptoms of: mood instability  INTERVENTIONS: Interventions utilized: Supportive Counseling and  Psychoeducation and/or Health Education   ASSESSMENT: Patient currently experiencing mood instability; periods of 2-4 hours of happiness/high energy, "blah" periods where he is functional but not happy, and 2-3 hours of low mood/energy where he isolates and does not want to be around anyone. He denies any pressured speech, rapidity of thoughts, impulsive behaviors, increased risk-taking, lack of need for sleep, overproductivity or motor agitation in his "up" episodes, as well as any thoughts of death/dying, guilty feelings, or crying spells during "down" episodes. Patient endorses hypersomnia, trouble concentrating, desire to isolate, lack of energy/motivation, hopeless feeling, increased appetite during "down" episodes. He states that "up" episodes consist of feeling happy and social, and he would like to be like that more often. Symptoms are not consistent with/ present enough to meet criteria for mania. 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 Counselor educated patient on the importance of self-care and routine in depression and guided him to identify some ways to implement these areas. Counselor provided patient with information about counseling services. He is not sure at this time whether he wants to seek ongoing counseling.    Patient may benefit from ongoing counseling for mood.  PLAN: 1. Patient will contact counselor to set up follow-up appointment if he so chooses.  Angus Palmsegina Alexander, LCSW

## 2018-05-18 ENCOUNTER — Other Ambulatory Visit: Payer: Self-pay | Admitting: Internal Medicine

## 2018-05-23 MED FILL — IMIQUIMOD 5% CREAM PACKET: 5 | 28 days supply | Qty: 12 | Fill #0

## 2018-05-27 ENCOUNTER — Other Ambulatory Visit: Payer: Self-pay | Admitting: Family Medicine

## 2018-05-27 DIAGNOSIS — A6001 Herpesviral infection of penis: Secondary | ICD-10-CM

## 2018-05-27 MED FILL — hydrOXYzine HCL 10 MG TABS: 10 | 30 days supply | Qty: 90 | Fill #3

## 2018-05-27 MED FILL — SF 5000 PLUS CREAM: 1.1 | 30 days supply | Qty: 51 | Fill #4

## 2018-05-27 MED FILL — ?VALACYCLOVIR HCL 500MG TAB: 500 | 30 days supply | Qty: 30 | Fill #0

## 2018-06-13 ENCOUNTER — Ambulatory Visit (INDEPENDENT_AMBULATORY_CARE_PROVIDER_SITE_OTHER): Payer: Self-pay | Admitting: *Deleted

## 2018-06-13 ENCOUNTER — Ambulatory Visit (INDEPENDENT_AMBULATORY_CARE_PROVIDER_SITE_OTHER): Payer: Self-pay | Admitting: Family Medicine

## 2018-06-13 ENCOUNTER — Telehealth: Payer: Self-pay

## 2018-06-13 ENCOUNTER — Encounter: Payer: Self-pay | Admitting: Family Medicine

## 2018-06-13 VITALS — BP 120/85 | HR 72 | Temp 98.4°F | Resp 16 | Ht 73.0 in | Wt 241.0 lb

## 2018-06-13 DIAGNOSIS — B20 Human immunodeficiency virus [HIV] disease: Secondary | ICD-10-CM

## 2018-06-13 DIAGNOSIS — R300 Dysuria: Secondary | ICD-10-CM

## 2018-06-13 DIAGNOSIS — Z113 Encounter for screening for infections with a predominantly sexual mode of transmission: Secondary | ICD-10-CM

## 2018-06-13 DIAGNOSIS — Z202 Contact with and (suspected) exposure to infections with a predominantly sexual mode of transmission: Secondary | ICD-10-CM

## 2018-06-13 DIAGNOSIS — Z23 Encounter for immunization: Secondary | ICD-10-CM

## 2018-06-13 DIAGNOSIS — L299 Pruritus, unspecified: Secondary | ICD-10-CM

## 2018-06-13 DIAGNOSIS — J029 Acute pharyngitis, unspecified: Secondary | ICD-10-CM

## 2018-06-13 LAB — POCT RAPID STREP A (OFFICE): Rapid Strep A Screen: NEGATIVE

## 2018-06-13 MED ORDER — HYDROXYZINE HCL 25 MG PO TABS: 25.0000 mg | ORAL_TABLET | Freq: Three times a day (TID) | ORAL | 1 refills | Status: DC | PRN

## 2018-06-13 MED ORDER — DOXYCYCLINE HYCLATE 100 MG PO TABS: 100.0000 mg | ORAL_TABLET | Freq: Two times a day (BID) | ORAL | 0 refills | Status: AC

## 2018-06-13 MED ORDER — CEFTRIAXONE SODIUM 250 MG IJ SOLR
250.0000 mg | Freq: Once | INTRAMUSCULAR | Status: AC
  Administered 2018-06-13: 250 mg via INTRAMUSCULAR

## 2018-06-13 MED FILL — hydrOXYzine HCL 25 MG TABS: 25 | 10 days supply | Qty: 30 | Fill #0

## 2018-06-13 MED FILL — ?DOXYCYCLINE 100MG TABLET: 100 | 7 days supply | Qty: 14 | Fill #0

## 2018-06-13 NOTE — Progress Notes (Signed)
    Subjective   Todd RainwaterChristopher M Carroll 42 y.o. male  [id number]  [id number]  [birth year]    Chief Complaint  Patient presents with  . Sore Throat  . Exposure to STD    Patient presents with sore throat x 2 weeks. Patient states that he thinks that he was exposed to gonorrhea. Patient reports 1 male partner in the past 3 months. Patient would like STI testing today. Patient states that he is the receiver and giver of anal and oral sex. Inconsistent condom use.    Review of Systems  Constitutional: Negative for chills and fever.  HENT: Positive for sore throat. Negative for congestion and ear pain.   Eyes: Negative.   Respiratory: Negative.   Cardiovascular: Negative.   Gastrointestinal: Negative.   Genitourinary: Negative for dysuria.  Neurological: Negative.   Psychiatric/Behavioral: Negative.     Objective   Physical Exam  Constitutional: He is oriented to person, place, and time. He appears well-developed and well-nourished. No distress.  HENT:  Head: Normocephalic and atraumatic.  Right Ear: Hearing, tympanic membrane and ear canal normal.  Left Ear: Hearing, tympanic membrane and ear canal normal.  Mouth/Throat: Uvula is midline. Posterior oropharyngeal edema and posterior oropharyngeal erythema present. No oropharyngeal exudate or tonsillar abscesses.  Eyes: Pupils are equal, round, and reactive to light. EOM are normal.  Neck: Normal range of motion. Neck supple.  Cardiovascular: Normal rate, regular rhythm, normal heart sounds and intact distal pulses.  No murmur heard. Pulmonary/Chest: Effort normal and breath sounds normal. No respiratory distress. He has no wheezes.  Abdominal: Soft. Bowel sounds are normal.  Lymphadenopathy:    He has no cervical adenopathy.  Neurological: He is alert and oriented to person, place, and time.  Skin: Skin is warm and dry.  Psychiatric: He has a normal mood and affect. His behavior is normal.    BP 120/85 (BP Location: Left  Arm, Patient Position: Sitting, Cuff Size: Large)   Pulse 72   Temp 98.4 F (36.9 C) (Oral)   Resp 16   Ht 6\' 1"  (1.854 m)   Wt 241 lb (109.3 kg)   SpO2 100%   BMI 31.80 kg/m   Assessment   Encounter Diagnoses  Name Primary?  . Dysuria   . Testicular discomfort   . Pharyngitis, unspecified etiology Yes  . Screen for STD (sexually transmitted disease)   . Pruritus      Plan   1. Dysuria Will treat for STI today.   2. Pharyngitis, unspecified etiology  - cefTRIAXone (ROCEPHIN) injection 250 mg - Rapid Strep A - Culture, Group A Strep  3. Screen for STD (sexually transmitted disease) Labs ordered. Pending - cefTRIAXone (ROCEPHIN) injection 250 mg - Chlamydia/GC NAA, Confirmation - Chlamydia/GC NAA, Confirmation  4. Pruritus Increased Atarax  - hydrOXYzine (ATARAX/VISTARIL) 25 MG tablet; Take 1 tablet (25 mg total) by mouth 3 (three) times daily as needed for itching.  Dispense: 30 tablet; Refill: 1  5. STD exposure - doxycycline (VIBRA-TABS) 100 MG tablet; Take 1 tablet (100 mg total) by mouth 2 (two) times daily for 7 days.  Dispense: 14 tablet; Refill: 0  This note has been created with Education officer, environmentalDragon speech recognition software and smart phrase technology. Any transcriptional errors are unintentional.

## 2018-06-13 NOTE — Patient Instructions (Signed)
Safe Sex Practicing safe sex means taking steps before and during sex to reduce your risk of:  Getting an STD (sexually transmitted disease).  Giving your partner an STD.  Unwanted pregnancy.  How can I practice safe sex?  To practice safe sex:  Limit your sexual partners to only one partner who is having sex with only you.  Avoid using alcohol and recreational drugs before having sex. These substances can affect your judgment.  Before having sex with a new partner: ? Talk to your partner about past partners, past STDs, and drug use. ? You and your partner should be screened for STDs and discuss the results with each other.  Check your body regularly for sores, blisters, rashes, or unusual discharge. If you notice any of these problems, visit your health care provider.  If you have symptoms of an infection or you are being treated for an STD, avoid sexual contact.  While having sex, use a condom. Make sure to: ? Use a condom every time you have vaginal, oral, or anal sex. Both females and males should wear condoms during oral sex. ? Keep condoms in place from the beginning to the end of sexual activity. ? Use a latex condom, if possible. Latex condoms offer the best protection. ? Use only water-based lubricants or oils to lubricate a condom. Using petroleum-based lubricants or oils will weaken the condom and increase the chance that it will break.  See your health care provider for regular screenings, exams, and tests for STDs.  Talk with your health care provider about the form of birth control (contraception) that is best for you.  Get vaccinated against hepatitis B and human papillomavirus (HPV).  If you are at risk of being infected with HIV (human immunodeficiency virus), talk with your health care provider about taking a prescription medicine to prevent HIV infection. You are considered at risk for HIV if: ? You are a man who has sex with other men. ? You are a  heterosexual man or woman who is sexually active with more than one partner. ? You take drugs by injection. ? You are sexually active with a partner who has HIV.  This information is not intended to replace advice given to you by your health care provider. Make sure you discuss any questions you have with your health care provider. Document Released: 12/27/2004 Document Revised: 04/04/2016 Document Reviewed: 10/09/2015 Elsevier Interactive Patient Education  2018 Elsevier Inc. Pharyngitis Pharyngitis is a sore throat (pharynx). There is redness, pain, and swelling of your throat. Follow these instructions at home:  Drink enough fluids to keep your pee (urine) clear or pale yellow.  Only take medicine as told by your doctor. ? You may get sick again if you do not take medicine as told. Finish your medicines, even if you start to feel better. ? Do not take aspirin.  Rest.  Rinse your mouth (gargle) with salt water ( tsp of salt per 1 qt of water) every 1-2 hours. This will help the pain.  If you are not at risk for choking, you can suck on hard candy or sore throat lozenges. Contact a doctor if:  You have large, tender lumps on your neck.  You have a rash.  You cough up green, yellow-brown, or bloody spit. Get help right away if:  You have a stiff neck.  You drool or cannot swallow liquids.  You throw up (vomit) or are not able to keep medicine or liquids down.  You have  very bad pain that does not go away with medicine.  You have problems breathing (not from a stuffy nose). This information is not intended to replace advice given to you by your health care provider. Make sure you discuss any questions you have with your health care provider. Document Released: 05/07/2008 Document Revised: 04/26/2016 Document Reviewed: 07/27/2013 Elsevier Interactive Patient Education  2017 [employer]

## 2018-06-13 NOTE — Progress Notes (Signed)
hp

## 2018-06-13 NOTE — Telephone Encounter (Signed)
Done. Thanks.

## 2018-06-16 ENCOUNTER — Other Ambulatory Visit: Payer: Self-pay | Admitting: Family Medicine

## 2018-06-16 DIAGNOSIS — J0300 Acute streptococcal tonsillitis, unspecified: Secondary | ICD-10-CM

## 2018-06-16 LAB — CULTURE, GROUP A STREP

## 2018-06-16 MED ORDER — PENICILLIN V POTASSIUM 500 MG PO TABS: 500.0000 mg | ORAL_TABLET | Freq: Two times a day (BID) | ORAL | 0 refills | Status: DC

## 2018-06-16 NOTE — Progress Notes (Signed)
Please inform patient that the throat culture was positive for strep throat. I sent penicillin to the pharmacy for this. Take all of the antibiotic. Change your toothbrush 24 hours after being on the medications. Thanks

## 2018-06-17 LAB — CHLAMYDIA/GC NAA, CONFIRMATION
Chlamydia trachomatis, NAA: NEGATIVE
Neisseria gonorrhoeae, NAA: NEGATIVE

## 2018-06-17 MED FILL — PENICILLIN VK 500 MG TABLET: 500 | 10 days supply | Qty: 20 | Fill #0

## 2018-06-17 NOTE — Progress Notes (Signed)
Patient does not have a phone number and does not want any results mailed to his house. Will advise when patient contacts our office. Thanks!

## 2018-06-18 LAB — CHLAMYDIA/GC NAA, CONFIRMATION
Chlamydia trachomatis, NAA: NEGATIVE
Neisseria gonorrhoeae, NAA: POSITIVE — AB

## 2018-06-18 LAB — N. GONORRHOEAE NAA, CONFIRM: N. gonorrhoeae NAA, Confirm: POSITIVE — AB

## 2018-06-18 NOTE — Progress Notes (Signed)
There is no phone number on file for patient, and a Note in chart to Not mail information to his house. I will advise him when he calls our office.   [facility]. Thanks!

## 2018-06-19 ENCOUNTER — Other Ambulatory Visit: Payer: Self-pay | Admitting: Family Medicine

## 2018-06-19 DIAGNOSIS — L299 Pruritus, unspecified: Secondary | ICD-10-CM

## 2018-06-19 MED ORDER — HYDROXYZINE HCL 25 MG PO TABS: 25.0000 mg | ORAL_TABLET | Freq: Three times a day (TID) | ORAL | 1 refills | Status: DC | PRN

## 2018-06-19 MED FILL — hydrOXYzine HCL 25 MG TABS: 25 | 10 days supply | Qty: 30 | Fill #1

## 2018-06-19 NOTE — Progress Notes (Signed)
Sent in vistaril script per patient's request.

## 2018-06-20 ENCOUNTER — Encounter: Payer: Self-pay | Admitting: Internal Medicine

## 2018-07-03 MED FILL — SF 5000 PLUS CREAM: 1.1 | 30 days supply | Qty: 51 | Fill #5

## 2018-07-03 MED FILL — VALACYCLOVIR HCL 500 MG TAB: 500 | 30 days supply | Qty: 30 | Fill #1

## 2018-07-03 MED FILL — hydrOXYzine HCL 25 MG TABS: 25 | 30 days supply | Qty: 90 | Fill #0

## 2018-07-14 ENCOUNTER — Ambulatory Visit: Payer: Self-pay | Admitting: Family Medicine

## 2018-07-31 ENCOUNTER — Other Ambulatory Visit: Payer: Self-pay

## 2018-07-31 ENCOUNTER — Ambulatory Visit (HOSPITAL_COMMUNITY)
Admission: RE | Admit: 2018-07-31 | Discharge: 2018-07-31 | Disposition: A | Discharge status: Home / Self Care | Payer: Self-pay | Source: Ambulatory Visit | Attending: Family Medicine | Admitting: Family Medicine

## 2018-07-31 ENCOUNTER — Ambulatory Visit (INDEPENDENT_AMBULATORY_CARE_PROVIDER_SITE_OTHER): Payer: Self-pay | Admitting: Family Medicine

## 2018-07-31 VITALS — BP 127/78 | HR 93 | Temp 98.1°F | Resp 14 | Ht 73.0 in | Wt 245.0 lb

## 2018-07-31 DIAGNOSIS — R55 Syncope and collapse: Secondary | ICD-10-CM | POA: Insufficient documentation

## 2018-07-31 DIAGNOSIS — R9431 Abnormal electrocardiogram [ECG] [EKG]: Secondary | ICD-10-CM | POA: Insufficient documentation

## 2018-07-31 DIAGNOSIS — L299 Pruritus, unspecified: Secondary | ICD-10-CM

## 2018-07-31 DIAGNOSIS — R300 Dysuria: Secondary | ICD-10-CM

## 2018-07-31 DIAGNOSIS — R7989 Other specified abnormal findings of blood chemistry: Secondary | ICD-10-CM

## 2018-07-31 LAB — POCT URINALYSIS DIPSTICK
Bilirubin, UA: NEGATIVE
Blood, UA: NEGATIVE
Glucose, UA: NEGATIVE
Ketones, UA: NEGATIVE
Leukocytes, UA: NEGATIVE
Nitrite, UA: NEGATIVE
Protein, UA: NEGATIVE
Spec Grav, UA: 1.015 (ref 1.010–1.025)
Urobilinogen, UA: 0.2 E.U./dL
pH, UA: 6 (ref 5.0–8.0)

## 2018-07-31 LAB — POCT GLYCOSYLATED HEMOGLOBIN (HGB A1C): Hemoglobin A1C: 5.6 % (ref 4.0–5.6)

## 2018-07-31 MED ORDER — HYDROXYZINE HCL 25 MG PO TABS
25.0000 mg | ORAL_TABLET | Freq: Three times a day (TID) | ORAL | 1 refills | Status: DC | PRN
Start: 2018-07-31 — End: 2018-10-03

## 2018-07-31 MED FILL — hydrOXYzine HCL 25 MG TABS: 25 | 30 days supply | Qty: 90 | Fill #0

## 2018-07-31 NOTE — Progress Notes (Signed)
  [facility]   Progress Note: General Provider: Mike GipAndre Laqueta Bonaventura, FNP  SUBJECTIVE:   Todd Carroll is a 42 y.o. male who  has a past medical history of ALLERGIC RHINITIS (08/27/2007) and GERD (05/04/2008).. Patient presents today for Loss of Consciousness (has had a passing out spell 2 weeks ago ) Patient states that he is concerned about having episodes where he passed out. States that he was told that he was sweating heavily prior to passing out. He states that he does not remember the event. He was told that he almost fell. Also states that he is having SOB.  States that he had a similar episode 2 months prior.  Patient states that he is having burning with urination and would like testing for STIs.    Review of Systems  Constitutional: Negative.   HENT: Negative.   Eyes: Negative.   Respiratory: Positive for shortness of breath.   Cardiovascular: Negative.   Gastrointestinal: Negative.   Genitourinary: Negative.   Musculoskeletal: Negative.   Skin: Negative.   Neurological: Negative.        Reported syncope x 2   Psychiatric/Behavioral: Negative.      OBJECTIVE: BP 127/78 (BP Location: Left Arm, Patient Position: Sitting, Cuff Size: Large)   Pulse 93   Temp 98.1 F (36.7 C) (Oral)   Resp 14   Ht 6\' 1"  (1.854 m)   Wt 245 lb (111.1 kg)   SpO2 98%   BMI 32.32 kg/m   Physical Exam  Constitutional: He is oriented to person, place, and time. He appears well-developed and well-nourished. No distress.  HENT:  Head: Normocephalic and atraumatic.  Right Ear: External ear normal.  Left Ear: External ear normal.  Nose: Nose normal.  Mouth/Throat: Oropharynx is clear and moist.  Eyes: Pupils are equal, round, and reactive to light. Conjunctivae and EOM are normal.  Neck: Normal range of motion.  Cardiovascular: Normal rate, regular rhythm, normal heart sounds and intact distal pulses.  Pulmonary/Chest: Effort normal and breath  sounds normal. No respiratory distress.  Abdominal: Soft. Bowel sounds are normal. He exhibits no distension.  Musculoskeletal: Normal range of motion.  Neurological: He is alert and oriented to person, place, and time.  Skin: Skin is warm and dry.  Psychiatric: He has a normal mood and affect. His behavior is normal. Thought content normal.  Nursing note and vitals reviewed.   ASSESSMENT/PLAN:   No changes noted on EKG.  1. Syncope, unspecified syncope type  - HgB A1c - Urinalysis Dipstick - EKG 12-Lead - DG Chest 2 View; Future - CBC with Differential - TSH - Comprehensive metabolic panel  2. Dysuria  - GC/Chlamydia Probe Amp([facility])  3. Pruritus Refilled - hydrOXYzine (ATARAX/VISTARIL) 25 MG tablet; Take 1 tablet (25 mg total) by mouth 3 (three) times daily as needed for itching.  Dispense: 90 tablet; Refill: 1       The patient was given clear instructions to go to ER or return to medical center if symptoms do not improve, worsen or new problems develop. The patient verbalized understanding and agreed with plan of care.   Ms. Freda Jacksonndr L. Riley Lamouglas, FNP-BC [facility] [address]  [place], [place] [address] [phone]     This note has been created with Dragon speech recognition software and smart phrase technology. Any transcriptional errors are unintentional.

## 2018-07-31 NOTE — Patient Instructions (Signed)
Near-Syncope °Near-syncope is when you suddenly get weak or dizzy, or you feel like you might pass out (faint). During an episode of near-syncope, you may: °· Feel dizzy or light-headed. °· Feel sick to your stomach (nauseous). °· See all white or all black. °· Have cold, clammy skin. ° °If you passed out, get help right away.Call your local emergency services (911 in the [place] Do not drive yourself to the hospital. °Follow these instructions at home: °Pay attention to any changes in your symptoms. Take these actions to help with your condition: °· Have someone stay with you until you feel stable. °· Do not drive, use machinery, or play sports until your doctor says it is okay. °· Keep all follow-up visits as told by your doctor. This is important. °· If you start to feel like you might pass out, lie down right away and raise (elevate) your feet above the level of your heart. Breathe deeply and steadily. Wait until all of the symptoms are gone. °· Drink enough fluid to keep your pee (urine) clear or pale yellow. °· If you are taking blood pressure or heart medicine, get up slowly and spend many minutes getting ready to sit and then stand. This can help with dizziness. °· Take over-the-counter and prescription medicines only as told by your doctor. ° °Get help right away if: °· You have a very bad headache. °· You have unusual pain in your chest, tummy, or back. °· You are bleeding from your mouth or rectum. °· You have black or tarry poop (stool). °· You have a very fast or uneven heartbeat (palpitations). °· You pass out one time or more than once. °· You have jerky movements that you cannot control (seizure). °· You are confused. °· You have trouble walking. °· You are very weak. °· You have vision problems. °These symptoms may be an emergency. Do not wait to see if the symptoms will go away. Get medical help right away. Call your local emergency services (911 in the [place] Do not drive yourself to the  hospital. °This information is not intended to replace advice given to you by your health care provider. Make sure you discuss any questions you have with your health care provider. °Document Released: 05/07/2008 Document Revised: 04/26/2016 Document Reviewed: 08/03/2015 °Elsevier Interactive Patient Education © 2017 Elsevier Inc. ° °

## 2018-08-01 LAB — CBC WITH DIFFERENTIAL/PLATELET
Basophils Absolute: 0 10*3/uL (ref 0.0–0.2)
Basos: 0 %
EOS (ABSOLUTE): 0.2 10*3/uL (ref 0.0–0.4)
Eos: 3 %
Hematocrit: 40.9 % (ref 37.5–51.0)
Hemoglobin: 13.7 g/dL (ref 13.0–17.7)
Immature Grans (Abs): 0 10*3/uL (ref 0.0–0.1)
Immature Granulocytes: 0 %
Lymphocytes Absolute: 2.6 10*3/uL (ref 0.7–3.1)
Lymphs: 34 %
MCH: 30.4 pg (ref 26.6–33.0)
MCHC: 33.5 g/dL (ref 31.5–35.7)
MCV: 91 fL (ref 79–97)
Monocytes Absolute: 0.6 10*3/uL (ref 0.1–0.9)
Monocytes: 8 %
Neutrophils Absolute: 4.1 10*3/uL (ref 1.4–7.0)
Neutrophils: 55 %
Platelets: 425 10*3/uL (ref 150–450)
RBC: 4.51 x10E6/uL (ref 4.14–5.80)
RDW: 13.6 % (ref 12.3–15.4)
WBC: 7.6 10*3/uL (ref 3.4–10.8)

## 2018-08-01 LAB — COMPREHENSIVE METABOLIC PANEL
ALT: 25 IU/L (ref 0–44)
AST: 24 IU/L (ref 0–40)
Albumin/Globulin Ratio: 1.5 (ref 1.2–2.2)
Albumin: 4.5 g/dL (ref 3.5–5.5)
Alkaline Phosphatase: 90 IU/L (ref 39–117)
BUN/Creatinine Ratio: 15 (ref 9–20)
BUN: 16 mg/dL (ref 6–24)
Bilirubin Total: <0.2 mg/dL (ref 0.0–1.2)
CO2: 21 mmol/L (ref 20–29)
Calcium: 9.7 mg/dL (ref 8.7–10.2)
Chloride: 103 mmol/L (ref 96–106)
Creatinine, Ser: 1.07 mg/dL (ref 0.76–1.27)
GFR calc Af Amer: 99 mL/min/{1.73_m2} (ref >59)
GFR calc non Af Amer: 86 mL/min/{1.73_m2} (ref >59)
Globulin, Total: 3.1 g/dL (ref 1.5–4.5)
Glucose: 89 mg/dL (ref 65–99)
Potassium: 4.3 mmol/L (ref 3.5–5.2)
Sodium: 141 mmol/L (ref 134–144)
Total Protein: 7.6 g/dL (ref 6.0–8.5)

## 2018-08-01 LAB — TSH: TSH: 0.337 u[IU]/mL — ABNORMAL LOW (ref 0.450–4.500)

## 2018-08-02 LAB — GC/CHLAMYDIA PROBE AMP
Chlamydia trachomatis, NAA: NEGATIVE
Neisseria gonorrhoeae by PCR: NEGATIVE

## 2018-08-06 ENCOUNTER — Encounter: Payer: Self-pay | Admitting: Family Medicine

## 2018-08-07 MED FILL — VALACYCLOVIR HCL 500 MG TAB: 500 | 30 days supply | Qty: 30 | Fill #2

## 2018-08-18 ENCOUNTER — Ambulatory Visit: Payer: Self-pay | Admitting: Internal Medicine

## 2018-08-24 ENCOUNTER — Other Ambulatory Visit: Payer: Self-pay | Admitting: Internal Medicine

## 2018-08-24 DIAGNOSIS — K219 Gastro-esophageal reflux disease without esophagitis: Secondary | ICD-10-CM

## 2018-09-11 ENCOUNTER — Telehealth: Payer: Self-pay

## 2018-09-11 ENCOUNTER — Ambulatory Visit (INDEPENDENT_AMBULATORY_CARE_PROVIDER_SITE_OTHER): Payer: Self-pay

## 2018-09-11 ENCOUNTER — Ambulatory Visit: Payer: Self-pay | Admitting: Family

## 2018-09-11 DIAGNOSIS — Z23 Encounter for immunization: Secondary | ICD-10-CM

## 2018-09-11 NOTE — Telephone Encounter (Signed)
Patient walked in this morning for a scheduled flu shot and asked to be seen today for pain and tingling on both arms for over 1 month. Patient refused same day appointment and requested an appointment for tomorrow Friday, 09/12/2018 instead.

## 2018-09-12 ENCOUNTER — Ambulatory Visit (INDEPENDENT_AMBULATORY_CARE_PROVIDER_SITE_OTHER): Payer: Self-pay | Admitting: Family

## 2018-09-12 ENCOUNTER — Encounter: Payer: Self-pay | Admitting: Family

## 2018-09-12 VITALS — BP 119/85 | HR 90 | Temp 98.4°F | Ht 73.0 in | Wt 253.0 lb

## 2018-09-12 DIAGNOSIS — M79602 Pain in left arm: Secondary | ICD-10-CM

## 2018-09-12 DIAGNOSIS — B20 Human immunodeficiency virus [HIV] disease: Secondary | ICD-10-CM

## 2018-09-12 DIAGNOSIS — Z23 Encounter for immunization: Secondary | ICD-10-CM

## 2018-09-12 DIAGNOSIS — M79601 Pain in right arm: Secondary | ICD-10-CM

## 2018-09-12 DIAGNOSIS — R21 Rash and other nonspecific skin eruption: Secondary | ICD-10-CM

## 2018-09-12 LAB — BASIC METABOLIC PANEL
BUN: 15 mg/dL (ref 7–25)
CO2: 29 mmol/L (ref 20–32)
CREATININE: 1.28 mg/dL (ref 0.60–1.35)
Calcium: 9.6 mg/dL (ref 8.6–10.3)
Chloride: 102 mmol/L (ref 98–110)
GLUCOSE: 108 mg/dL — AB (ref 65–99)
Potassium: 4.3 mmol/L (ref 3.5–5.3)
Sodium: 138 mmol/L (ref 135–146)

## 2018-09-12 LAB — LIPID PANEL
Cholesterol: 202 mg/dL — ABNORMAL HIGH (ref <200)
HDL: 49 mg/dL (ref >40)
LDL Cholesterol (Calc): 134 mg/dL (calc) — ABNORMAL HIGH
Non-HDL Cholesterol (Calc): 153 mg/dL (calc) — ABNORMAL HIGH (ref <130)
Total CHOL/HDL Ratio: 4.1 (calc) (ref <5.0)
Triglycerides: 87 mg/dL (ref <150)

## 2018-09-12 MED ORDER — METHYLPREDNISOLONE 4 MG PO TBPK: ORAL_TABLET | ORAL | 0 refills | Status: DC

## 2018-09-12 MED FILL — VALACYCLOVIR HCL 500 MG TAB: 500 | 30 days supply | Qty: 30 | Fill #3

## 2018-09-12 MED FILL — hydrOXYzine HCL 25 MG TABS: 25 | 30 days supply | Qty: 90 | Fill #1

## 2018-09-12 NOTE — Assessment & Plan Note (Signed)
Continues to have a rash on his lower leg that is currently unidentifiable from its current form but is refractory to OTC hydrocortisone cream. Advised to use soap and water for cleaning. Will send to dermatology for possible biopsy for further identification.

## 2018-09-12 NOTE — Assessment & Plan Note (Signed)
Menveo updated today. Continue current dose of Biktarvy. Will check lipid profile and BMP per patient request. Due for follow up with Dr. Drue Second.

## 2018-09-12 NOTE — Assessment & Plan Note (Signed)
Todd Carroll is having bilateral arm pain at night when lying down which appears to be related to muscle imbalance. He has good circulation and sensation on exam today with no current symptoms. He does not performed any repetitive motions and denies trauma or injury. Will prescribe a short course of prednisone and recommend home exercise therapy for his neck and mid-back to help improve posture. If his symptoms do not improve may need to consider orthopedic or neurological examination.

## 2018-09-12 NOTE — Progress Notes (Signed)
Subjective:    Patient ID: [name], male    DOB: 1976/01/13, 42 y.o.   MRN: [id number]  Chief Complaint  Patient presents with  . Follow-up    sick visit- bilateral arm pain    HPI:  Todd Carroll is a 42 y.o. male who presents today for an acute office visit.  1.) Arm pain - This is a new problem. Todd Carroll has been experiencing pain located in his bilateral arms that has been going on for about 1 month. Describes as a tightness and throbiing with numbness located in his hands and occurs primarily at night. Modifying factors include Tylenol which does help with the symptoms. No trauma or injury to his arms or neck. No neck pain or stiffness. Symptoms are when lying down. He sleeps in multiple positions throughout the night. Not currently working or performing   2.) Rash on legs - Previously seen in the office for rash located on his left lower leg and prescribed and hydrocortisone did not help with his symptoms. It does bleed at times. Itching is increased by hot water. Symptoms have not worsened or improved since his last office visit.   3) Skin tag - Previously seen in the office and prescribed Aldara cream for a "skin tag" located near his tailbone which has been going on for over a year. He was prescribed Aldara cream, however it did not help after two weeks of usage. His symptoms have not worsened or improved since his last office visit.    Allergies  Allergen Reactions  . Azithromycin Itching      Outpatient Medications Prior to Visit  Medication Sig Dispense Refill  . acetaminophen (TYLENOL) 500 MG tablet Take 1 tablet (500 mg total) by mouth every 6 (six) hours as needed. 30 tablet 0  . BIKTARVY 50-200-25 MG TABS tablet TAKE 1 TABLET BY MOUTH DAILY 30 tablet 5  . hydrocortisone 1 % ointment Apply 1 application topically 2 (two) times daily. To rash on legs 30 g 0  . hydrOXYzine (ATARAX/VISTARIL) 25 MG tablet Take 1 tablet (25 mg total) by mouth 3 (three)  times daily as needed for itching. 90 tablet 1  . imiquimod (ALDARA) 5 % cream Apply topically 3 (three) times a week. Apply at bedtime to wart, wash off in the morning. No longer than 8 hrs/nights 12 each 0  . meloxicam (MOBIC) 7.5 MG tablet Take 1 tablet (7.5 mg total) by mouth daily. 30 tablet 1  . mometasone (NASONEX) 50 MCG/ACT nasal spray Place 2 sprays into the nose daily.    Marland Kitchen omeprazole (PRILOSEC) 20 MG capsule TAKE 1 CAPSULE(20 MG) BY MOUTH DAILY 30 capsule 5  . traMADol (ULTRAM) 50 MG tablet Take 1 tablet (50 mg total) by mouth every 6 (six) hours as needed. 15 tablet 0  . valACYclovir (VALTREX) 500 MG tablet TAKE 1 TABLET BY MOUTH DAILY. 30 tablet 4   No facility-administered medications prior to visit.      Past Medical History:  Diagnosis Date  . ALLERGIC RHINITIS 08/27/2007  . GERD 05/04/2008     Past Surgical History:  Procedure Laterality Date  . APPENDECTOMY         Review of Systems  Constitutional: Negative for appetite change, chills, fatigue, fever and unexpected weight change.  Eyes: Negative for visual disturbance.  Respiratory: Negative for cough, chest tightness, shortness of breath and wheezing.   Cardiovascular: Negative for chest pain and leg swelling.  Gastrointestinal: Negative for abdominal pain,  constipation, diarrhea, nausea and vomiting.  Musculoskeletal:       Positive for bilateral arm pain  Skin: Positive for rash.  Allergic/Immunologic: Negative for immunocompromised state.  Neurological: Negative for dizziness and headaches.      Objective:    BP 119/85   Pulse 90   Temp 98.4 F (36.9 C)   Ht 6\' 1"  (1.854 m)   Wt 253 lb (114.8 kg)   BMI 33.38 kg/m  Nursing note and vital signs reviewed.  Physical Exam  Constitutional: He is oriented to person, place, and time. He appears well-developed and well-nourished. No distress.  Cardiovascular: Normal rate, regular rhythm, normal heart sounds and intact distal pulses.  Pulmonary/Chest:  Effort normal and breath sounds normal.  Musculoskeletal:  Bilateral arms with no obvious deformity, discoloration or edema. No tenderness able to be elicited and unable to reproduce symptoms. He has very tight chest and neck muscles and forward posture. Distal pulses and sensation are intact and appropriate.   Neurological: He is alert and oriented to person, place, and time.  Skin: Skin is warm and dry.  Two pencil eraser sized areas located on his left lateral distal lower extremity appear with beefy red color with no drainage as if they were scratched or picked. There is no surrounding edema, pain or induration.   Psychiatric: He has a normal mood and affect. His behavior is normal. Judgment and thought content normal.       Assessment & Plan:   Problem List Items Addressed This Visit      Musculoskeletal and Integument   Rash and nonspecific skin eruption    Continues to have a rash on his lower leg that is currently unidentifiable from its current form but is refractory to OTC hydrocortisone cream. Advised to use soap and water for cleaning. Will send to dermatology for possible biopsy for further identification.       Relevant Orders   Ambulatory referral to Dermatology     Other   Human immunodeficiency virus (HIV) disease (HCC)    Menveo updated today. Continue current dose of Biktarvy. Will check lipid profile and BMP per patient request. Due for follow up with Dr. Drue Carroll.       Relevant Orders   Lipid panel   Basic Metabolic Panel (BMET)   MENINGOCOCCAL MCV4O (Completed)   Bilateral arm pain - Primary    Todd Carroll is having bilateral arm pain at night when lying down which appears to be related to muscle imbalance. He has good circulation and sensation on exam today with no current symptoms. He does not performed any repetitive motions and denies trauma or injury. Will prescribe a short course of prednisone and recommend home exercise therapy for his neck and mid-back to  help improve posture. If his symptoms do not improve may need to consider orthopedic or neurological examination.       Relevant Medications   methylPREDNISolone (MEDROL) 4 MG TBPK tablet    Other Visit Diagnoses    Need for meningococcal vaccination       Relevant Orders   MENINGOCOCCAL MCV4O (Completed)       I am having Stephanie Coup. Kimura start on methylPREDNISolone. I am also having him maintain his mometasone, acetaminophen, meloxicam, traMADol, imiquimod, hydrocortisone, BIKTARVY, valACYclovir, hydrOXYzine, and omeprazole.   Meds ordered this encounter  Medications  . methylPREDNISolone (MEDROL) 4 MG TBPK tablet    Sig: Take as directed on package.    Dispense:  21 tablet    Refill:  0    Order Specific Question:   Supervising Provider    Answer:   Judyann Munson [[id number]]     Follow-up: With Dr. Drue Carroll for routine HIV care and if symptoms worsen or do not improve.   Marcos Eke, MSN, FNP-C Nurse Practitioner [facility] Office phone: [phone] Pager: [phone] [facility] Main number: [phone]

## 2018-09-12 NOTE — Patient Instructions (Addendum)
Nice to meet you.  It appears your arm pain may be occurring from posture causing some inflammation. Stretches and exercises are below. Working on improving your posture.   We will refer you to dermatology for your leg and skin tag. If you have not heard back in about 1-2 weeks please call Ashely Rehner in our office to check the status.   Keep your leg clean with soap and water. No peroxide or alcohol.  We will check your blood work today.   If your symptoms are not improving, please let us know.    Cervical Strain and Sprain Rehab Ask your health care provider which exercises are safe for you. Do exercises exactly as told by your health care provider and adjust them as directed. It is normal to feel mild stretching, pulling, tightness, or discomfort as you do these exercises, but you should stop right away if you feel sudden pain or your pain gets worse.Do not begin these exercises until told by your health care provider. Stretching and range of motion exercises These exercises warm up your muscles and joints and improve the movement and flexibility of your neck. These exercises also help to relieve pain, numbness, and tingling. Exercise A: Cervical side bend  1. Using good posture, sit on a stable chair or stand up. 2. Without moving your shoulders, slowly tilt your left / right ear to your shoulder until you feel a stretch in your neck muscles. You should be looking straight ahead. 3. Hold for __________ seconds. 4. Repeat with the other side of your neck. Repeat __________ times. Complete this exercise __________ times a day. Exercise B: Cervical rotation  1. Using good posture, sit on a stable chair or stand up. 2. Slowly turn your head to the side as if you are looking over your left / right shoulder. ? Keep your eyes level with the ground. ? Stop when you feel a stretch along the side and the back of your neck. 3. Hold for __________ seconds. 4. Repeat this by turning to your  other side. Repeat __________ times. Complete this exercise __________ times a day. Exercise C: Thoracic extension and pectoral stretch 1. Roll a towel or a small blanket so it is about 4 inches (10 cm) in diameter. 2. Lie down on your back on a firm surface. 3. Put the towel lengthwise, under your spine in the middle of your back. It should not be not under your shoulder blades. The towel should line up with your spine from your middle back to your lower back. 4. Put your hands behind your head and let your elbows fall out to your sides. 5. Hold for __________ seconds. Repeat __________ times. Complete this exercise __________ times a day. Strengthening exercises These exercises build strength and endurance in your neck. Endurance is the ability to use your muscles for a long time, even after your muscles get tired. Exercise D: Upper cervical flexion, isometric 1. Lie on your back with a thin pillow behind your head and a small rolled-up towel under your neck. 2. Gently tuck your chin toward your chest and nod your head down to look toward your feet. Do not lift your head off the pillow. 3. Hold for __________ seconds. 4. Release the tension slowly. Relax your neck muscles completely before you repeat this exercise. Repeat __________ times. Complete this exercise __________ times a day. Exercise E: Cervical extension, isometric  1. Stand about 6 inches (15 cm) away from a wall, with your back  facing the wall. 2. Place a soft object, about 6-8 inches (15-20 cm) in diameter, between the back of your head and the wall. A soft object could be a small pillow, a ball, or a folded towel. 3. Gently tilt your head back and press into the soft object. Keep your jaw and forehead relaxed. 4. Hold for __________ seconds. 5. Release the tension slowly. Relax your neck muscles completely before you repeat this exercise. Repeat __________ times. Complete this exercise __________ times a day. Posture and  body mechanics  Body mechanics refers to the movements and positions of your body while you do your daily activities. Posture is part of body mechanics. Good posture and healthy body mechanics can help to relieve stress in your body's tissues and joints. Good posture means that your spine is in its natural S-curve position (your spine is neutral), your shoulders are pulled back slightly, and your head is not tipped forward. The following are general guidelines for applying improved posture and body mechanics to your everyday activities. Standing  When standing, keep your spine neutral and keep your feet about hip-width apart. Keep a slight bend in your knees. Your ears, shoulders, and hips should line up.  When you do a task in which you stand in one place for a long time, place one foot up on a stable object that is 2-4 inches (5-10 cm) high, such as a footstool. This helps keep your spine neutral. Sitting   When sitting, keep your spine neutral and your keep feet flat on the floor. Use a footrest, if necessary, and keep your thighs parallel to the floor. Avoid rounding your shoulders, and avoid tilting your head forward.  When working at a desk or a computer, keep your desk at a height where your hands are slightly lower than your elbows. Slide your chair under your desk so you are close enough to maintain good posture.  When working at a computer, place your monitor at a height where you are looking straight ahead and you do not have to tilt your head forward or downward to look at the screen. Resting When lying down and resting, avoid positions that are most painful for you. Try to support your neck in a neutral position. You can use a contour pillow or a small rolled-up towel. Your pillow should support your neck but not push on it. This information is not intended to replace advice given to you by your health care provider. Make sure you discuss any questions you have with your health care  provider. Document Released: 11/19/2005 Document Revised: 07/26/2016 Document Reviewed: 10/26/2015 Elsevier Interactive Patient Education  2018 [employer]   Mid-Back Strain Rehab Ask your health care provider which exercises are safe for you. Do exercises exactly as told by your health care provider and adjust them as directed. It is normal to feel mild stretching, pulling, tightness, or discomfort as you do these exercises, but you should stop right away if you feel sudden pain or your pain gets worse. Do not begin these exercises until told by your health care provider. Stretching and range of motion exercises This exercise warms up your muscles and joints and improves the movement and flexibility of your back and shoulders. This exercise also help to relieve pain. Exercise A: Chest and spine stretch  1. Lie down on your back on a firm surface. 2. Roll a towel or a small blanket so it is about 4 inches (10 cm) in diameter. 3. Put the  towel lengthwise under the middle of your back so it is under your spine, but not under your shoulder blades. 4. To increase the stretch, you may put your hands behind your head and let your elbows fall to your sides. 5. Hold for __________ seconds. Repeat exercise __________ times. Complete this exercise __________ times a day. Strengthening exercises These exercises build strength and endurance in your back and your shoulder blade muscles. Endurance is the ability to use your muscles for a long time, even after they get tired. Exercise B: Alternating arm and leg raises  1. Get on your hands and knees on a firm surface. If you are on a hard floor, you may want to use padding to cushion your knees, such as an exercise mat. 2. Line up your arms and legs. Your hands should be below your shoulders, and your knees should be below your hips. 3. Lift your left leg behind you. At the same time, raise your right arm and straighten it in front of you. ? Do not lift  your leg higher than your hip. ? Do not lift your arm higher than your shoulder. ? Keep your abdominal and back muscles tight. ? Keep your hips facing the ground. ? Do not arch your back. ? Keep your balance carefully, and do not hold your breath. 4. Hold for __________ seconds. 5. Slowly return to the starting position and repeat with your right leg and your left arm. Repeat __________ times. Complete this exercise __________ times a day. Exercise C: Straight arm rows ( shoulder extension) 1. Stand with your feet shoulder width apart. 2. Secure an exercise band to a stable object in front of you so the band is at or above shoulder height. 3. Hold one end of the exercise band in each hand. 4. Straighten your elbows and lift your hands up to shoulder height. 5. Step back, away from the secured end of the exercise band, until the band stretches. 6. Squeeze your shoulder blades together and pull your hands down to the sides of your thighs. Stop when your hands are straight down by your sides. Do not let your hands go behind your body. 7. Hold for __________ seconds. 8. Slowly return to the starting position. Repeat __________ times. Complete this exercise __________ times a day. Exercise D: Shoulder external rotation, prone 1. Lie on your abdomen on a firm bed so your left / right forearm hangs over the edge of the bed and your upper arm is on the bed, straight out from your body. ? Your elbow should be bent. ? Your palm should be facing your feet. 2. If instructed, hold a __________ weight in your hand. 3. Squeeze your shoulder blade toward the middle of your back. Do not let your shoulder lift toward your ear. 4. Keep your elbow bent in an "L" shape (90 degrees) while you slowly move your forearm up toward the ceiling. Move your forearm up to the height of the bed, toward your head. ? Your upper arm should not move. ? At the top of the movement, your palm should face the floor. 5. Hold  for __________ seconds. 6. Slowly return to the starting position and relax your muscles. Repeat __________ times. Complete this exercise __________ times a day. Exercise E: Scapular retraction and external rotation, rowing  1. Sit in a stable chair without armrests, or stand. 2. Secure an exercise band to a stable object in front of you so it is at shoulder height. 3.  Hold one end of the exercise band in each hand. 4. Bring your arms out straight in front of you. 5. Step back, away from the secured end of the exercise band, until the band stretches. 6. Pull the band backward. As you do this, bend your elbows and squeeze your shoulder blades together, but avoid letting the rest of your body move. Do not let your shoulders lift up toward your ears. 7. Stop when your elbows are at your sides or slightly behind your body. 8. Hold for __________ seconds. 9. Slowly straighten your arms to return to the starting position. Repeat __________ times. Complete this exercise __________ times a day. Posture and body mechanics  Body mechanics refers to the movements and positions of your body while you do your daily activities. Posture is part of body mechanics. Good posture and healthy body mechanics can help to relieve stress in your body's tissues and joints. Good posture means that your spine is in its natural S-curve position (your spine is neutral), your shoulders are pulled back slightly, and your head is not tipped forward. The following are general guidelines for applying improved posture and body mechanics to your everyday activities. Standing   When standing, keep your spine neutral and your feet about hip-width apart. Keep a slight bend in your knees. Your ears, shoulders, and hips should line up.  When you do a task in which you lean forward while standing in one place for a long time, place one foot up on a stable object that is 2-4 inches (5-10 cm) high, such as a footstool. This helps keep  your spine neutral. Sitting   When sitting, keep your spine neutral and keep your feet flat on the floor. Use a footrest, if necessary, and keep your thighs parallel to the floor. Avoid rounding your shoulders, and avoid tilting your head forward.  When working at a desk or a computer, keep your desk at a height where your hands are slightly lower than your elbows. Slide your chair under your desk so you are close enough to maintain good posture.  When working at a computer, place your monitor at a height where you are looking straight ahead and you do not have to tilt your head forward or downward to look at the screen. Resting  When lying down and resting, avoid positions that are most painful for you.  If you have pain with activities such as sitting, bending, stooping, or squatting (flexion-based activities), lie in a position in which your body does not bend very much. For example, avoid curling up on your side with your arms and knees near your chest (fetal position).  If you have pain with activities such as standing for a long time or reaching with your arms (extension-based activities), lie with your spine in a neutral position and bend your knees slightly. Try the following positions:  Lying on your side with a pillow between your knees.  Lying on your back with a pillow under your knees.  Lifting   When lifting objects, keep your feet at least shoulder-width apart and tighten your abdominal muscles.  Bend your knees and hips and keep your spine neutral. It is important to lift using the strength of your legs, not your back. Do not lock your knees straight out.  Always ask for help to lift heavy or awkward objects. This information is not intended to replace advice given to you by your health care provider. Make sure you discuss any questions you  have with your health care provider. Document Released: 11/19/2005 Document Revised: 07/26/2016 Document Reviewed:  08/31/2015 Elsevier Interactive Patient Education  [employer].

## 2018-09-12 NOTE — Progress Notes (Signed)
Menveo #2 administered today. Patient tolerated well.

## 2018-10-03 ENCOUNTER — Ambulatory Visit (INDEPENDENT_AMBULATORY_CARE_PROVIDER_SITE_OTHER): Payer: Self-pay | Admitting: Family Medicine

## 2018-10-03 ENCOUNTER — Encounter: Payer: Self-pay | Admitting: Family Medicine

## 2018-10-03 VITALS — BP 140/80 | HR 92 | Temp 99.1°F | Ht 73.0 in | Wt 255.0 lb

## 2018-10-03 DIAGNOSIS — Z09 Encounter for follow-up examination after completed treatment for conditions other than malignant neoplasm: Secondary | ICD-10-CM

## 2018-10-03 DIAGNOSIS — R239 Unspecified skin changes: Secondary | ICD-10-CM

## 2018-10-03 DIAGNOSIS — M79671 Pain in right foot: Secondary | ICD-10-CM

## 2018-10-03 DIAGNOSIS — L299 Pruritus, unspecified: Secondary | ICD-10-CM

## 2018-10-03 DIAGNOSIS — G8929 Other chronic pain: Secondary | ICD-10-CM

## 2018-10-03 DIAGNOSIS — R1084 Generalized abdominal pain: Secondary | ICD-10-CM

## 2018-10-03 DIAGNOSIS — A09 Infectious gastroenteritis and colitis, unspecified: Secondary | ICD-10-CM

## 2018-10-03 LAB — POCT URINALYSIS DIP (MANUAL ENTRY)
Bilirubin, UA: NEGATIVE
Blood, UA: NEGATIVE
Glucose, UA: NEGATIVE mg/dL
Ketones, POC UA: NEGATIVE mg/dL
Leukocytes, UA: NEGATIVE
Nitrite, UA: NEGATIVE
Protein Ur, POC: NEGATIVE mg/dL
Spec Grav, UA: 1.01 (ref 1.010–1.025)
Urobilinogen, UA: 0.2 E.U./dL
pH, UA: 6 (ref 5.0–8.0)

## 2018-10-03 MED ORDER — HYDROXYZINE HCL 25 MG PO TABS: 25.0000 mg | ORAL_TABLET | Freq: Three times a day (TID) | ORAL | 6 refills | Status: DC | PRN

## 2018-10-03 MED ORDER — MELOXICAM 15 MG PO TABS: 15.0000 mg | ORAL_TABLET | Freq: Every day | ORAL | 2 refills | Status: DC

## 2018-10-03 MED ORDER — CIPROFLOXACIN HCL 500 MG PO TABS: 500.0000 mg | ORAL_TABLET | Freq: Two times a day (BID) | ORAL | 0 refills | Status: DC

## 2018-10-03 MED ORDER — TRAMADOL HCL 50 MG PO TABS: 50.0000 mg | ORAL_TABLET | Freq: Two times a day (BID) | ORAL | 0 refills | Status: DC

## 2018-10-03 MED FILL — CIPROFLOXACIN HCL 500 MG TA: 500 | 7 days supply | Qty: 14 | Fill #0

## 2018-10-03 MED FILL — traMADol HCL 50 MG TABS: 50 | 22 days supply | Qty: 45 | Fill #0

## 2018-10-03 MED FILL — MELOXICAM 15 MG TABLET: 15 | 30 days supply | Qty: 30 | Fill #0

## 2018-10-03 NOTE — Patient Instructions (Signed)
Ciprofloxacin tablets  What is this medicine?  CIPROFLOXACIN (sip roe FLOX a sin) is a quinolone antibiotic. It is used to treat certain kinds of bacterial infections. It will not work for colds, flu, or other viral infections.  This medicine may be used for other purposes; ask your health care provider or pharmacist if you have questions.  COMMON BRAND NAME(S): Cipro  What should I tell my health care provider before I take this medicine?  They need to know if you have any of these conditions:  -bone problems  -history of low levels of potassium in the blood  -joint problems  -irregular heartbeat  -kidney disease  -myasthenia gravis  -seizures  -tendon problems  -tingling of the fingers or toes, or other nerve disorder  -an unusual or allergic reaction to ciprofloxacin, other antibiotics or medicines, foods, dyes, or preservatives  -pregnant or trying to get pregnant  -breast-feeding  How should I use this medicine?  Take this medicine by mouth with a glass of water. Follow the directions on the prescription label. Take your medicine at regular intervals. Do not take your medicine more often than directed. Take all of your medicine as directed even if you think your are better. Do not skip doses or stop your medicine early.  You can take this medicine with food or on an empty stomach. It can be taken with a meal that contains dairy or calcium, but do not take it alone with a dairy product, like milk or yogurt or calcium-fortified juice.  A special MedGuide will be given to you by the pharmacist with each prescription and refill. Be sure to read this information carefully each time.  Talk to your pediatrician regarding the use of this medicine in children. Special care may be needed.  Overdosage: If you think you have taken too much of this medicine contact a poison control center or emergency room at once.  NOTE: This medicine is only for you. Do not share this medicine with others.  What if I miss a dose?  If you  miss a dose, take it as soon as you can. If it is almost time for your next dose, take only that dose. Do not take double or extra doses.  What may interact with this medicine?  Do not take this medicine with any of the following medications:  -cisapride  -dofetilide  -dronedarone  -flibanserin  -lomitapide  -pimozide  -thioridazine  -tizanidine  -ziprasidone  This medicine may also interact with the following medications:  -antacids  -birth control pills  -caffeine  -certain medicines for diabetes, like glipizide or glyburide  -certain medicines that treat or prevent blood clots like warfarin  -clozapine  -cyclosporine  -didanosine (ddI) buffered tablets or powder  -duloxetine  -lanthanum carbonate  -lidocaine  -methotrexate  -multivitamins  -NSAIDS, medicines for pain and inflammation, like ibuprofen or naproxen  -olanzapine  -omeprazole  -other medicines that prolong the QT interval (cause an abnormal heart rhythm)  -phenytoin  -probenecid  -ropinirole  -sevelamer  -sildenafil  -sucralfate  -theophylline  -zolpidem  This list may not describe all possible interactions. Give your health care provider a list of all the medicines, herbs, non-prescription drugs, or dietary supplements you use. Also tell them if you smoke, drink alcohol, or use illegal drugs. Some items may interact with your medicine.  What should I watch for while using this medicine?  Tell your doctor or health care professional if your symptoms do not improve.  Do not treat   diarrhea with over the counter products. Contact your doctor if you have diarrhea that lasts more than 2 days or if it is severe and watery.  You may get drowsy or dizzy. Do not drive, use machinery, or do anything that needs mental alertness until you know how this medicine affects you. Do not stand or sit up quickly, especially if you are an older patient. This reduces the risk of dizzy or fainting spells.  This medicine can make you more sensitive to the sun. Keep out of the  sun. If you cannot avoid being in the sun, wear protective clothing and use sunscreen. Do not use sun lamps or tanning beds/booths.  Avoid antacids, aluminum, calcium, iron, magnesium, and zinc products for 6 hours before and 2 hours after taking a dose of this medicine.  What side effects may I notice from receiving this medicine?  Side effects that you should report to your doctor or health care professional as soon as possible:  -allergic reactions like skin rash or hives, swelling of the face, lips, or tongue  -anxious  -confusion  -depressed mood  -diarrhea  -fast, irregular heartbeat  -hallucination, loss of contact with reality  -joint, muscle, or tendon pain or swelling  -pain, tingling, numbness in the hands or feet  -suicidal thoughts or other mood changes  -sunburn  -unusually weak or tired  Side effects that usually do not require medical attention (report to your doctor or health care professional if they continue or are bothersome):  -dry mouth  -headache  -nausea  -trouble sleeping  This list may not describe all possible side effects. Call your doctor for medical advice about side effects. You may report side effects to FDA at 1-800-FDA-1088.  Where should I keep my medicine?  Keep out of the reach of children.  Store at room temperature below 30 degrees C (86 degrees F). Keep container tightly closed. Throw away any unused medicine after the expiration date.  NOTE: This sheet is a summary. It may not cover all possible information. If you have questions about this medicine, talk to your doctor, pharmacist, or health care provider.  © 2018 Elsevier/Gold Standard (2016-06-29 14:42:02)

## 2018-10-03 NOTE — Progress Notes (Signed)
Follow Up  Subjective:    Patient ID: Todd Carroll, male    DOB: 07-13-1976, 42 y.o.   MRN: [id number]  Chief Complaint  Patient presents with  . Emesis  . Fever  . Diarrhea   HPI  Todd Carroll is a 42 year old male with a past medical history of GERD and Allergic Rhinitis. He is here today for follow up.   Current Status: Since his last office visit, he states that he has been having nausea, diarrhea, and fever for over 1 week now.  His temperatures have been 98.2-100.5. He has been having mild chills and fatigue, and occasional diaphoresis. He has taken Imodium and Acetaminophen as needed with minimal relief. He has been drinking plenty fluid. He denies recent infections, weight loss, and night sweats. He has not had any headaches, visual changes, dizziness, and falls. No chest pain, heart palpitations, cough and shortness of breath reported.   No reports of GI problems such as nausea, vomiting, diarrhea, and constipation. He has no reports of blood in stools, dysuria and hematuria. No depression or anxiety reported. He denies pain today.   Past Medical History:  Diagnosis Date  . ALLERGIC RHINITIS 08/27/2007  . GERD 05/04/2008   History reviewed. No pertinent family history.  Social History   Socioeconomic History  . Marital status: Single    Spouse name: Not on file  . Number of children: Not on file  . Years of education: Not on file  . Highest education level: Not on file  Occupational History  . Not on file  Social Needs  . Financial resource strain: Not on file  . Food insecurity:    Worry: Not on file    Inability: Not on file  . Transportation needs:    Medical: Not on file    Non-medical: Not on file  Tobacco Use  . Smoking status: Never Smoker  . Smokeless tobacco: Never Used  Substance and Sexual Activity  . Alcohol use: Yes    Alcohol/week: 1.0 standard drinks    Types: 1 Standard drinks or equivalent per week    Comment: social , liquor  . Drug use:  No  . Sexual activity: Not Currently    Partners: Female, Male    Birth control/protection: Condom    Comment: pt declined condoms  Lifestyle  . Physical activity:    Days per week: Not on file    Minutes per session: Not on file  . Stress: Not on file  Relationships  . Social connections:    Talks on phone: Not on file    Gets together: Not on file    Attends religious service: Not on file    Active member of club or organization: Not on file    Attends meetings of clubs or organizations: Not on file    Relationship status: Not on file  . Intimate partner violence:    Fear of current or ex partner: Not on file    Emotionally abused: Not on file    Physically abused: Not on file    Forced sexual activity: Not on file  Other Topics Concern  . Not on file  Social History Narrative  . Not on file    Past Surgical History:  Procedure Laterality Date  . APPENDECTOMY      Immunization History  Administered Date(s) Administered  . HPV 9-valent 05/15/2018, 06/13/2018  . Hepatitis A 04/02/2013  . Hepatitis A, Adult 10/23/2013  . Hepatitis B 04/02/2013, 06/02/2013  .  Hepatitis B, adult 01/05/2014  . Influenza Split 09/14/2011  . Influenza,inj,Quad PF,6+ Mos 10/23/2013, 11/01/2014, 11/15/2015, 08/31/2016, 08/31/2016, 07/24/2017, 09/11/2018  . Meningococcal Mcv4o 03/06/2017, 09/12/2018  . PPD Test 03/19/2013  . Pneumococcal Conjugate-13 05/15/2018  . Pneumococcal Polysaccharide-23 03/19/2013  . Td 12/03/2006  . Tdap 07/24/2017    Current Meds  Medication Sig  . BIKTARVY 50-200-25 MG TABS tablet TAKE 1 TABLET BY MOUTH DAILY  . hydrOXYzine (ATARAX/VISTARIL) 25 MG tablet Take 1 tablet (25 mg total) by mouth 3 (three) times daily as needed for itching.  Marland Kitchen omeprazole (PRILOSEC) 20 MG capsule TAKE 1 CAPSULE(20 MG) BY MOUTH DAILY  . [DISCONTINUED] hydrOXYzine (ATARAX/VISTARIL) 25 MG tablet Take 1 tablet (25 mg total) by mouth 3 (three) times daily as needed for itching.     Allergies  Allergen Reactions  . Azithromycin Itching    BP 140/80 (BP Location: Left Arm, Patient Position: Sitting, Cuff Size: Large)   Pulse 92   Temp 99.1 F (37.3 C) (Oral)   Ht 6\' 1"  (1.854 m)   Wt 255 lb (115.7 kg)   SpO2 100%   BMI 33.64 kg/m   Review of Systems  Constitutional: Negative.   HENT: Negative.   Respiratory: Negative.   Cardiovascular: Negative.   Gastrointestinal: Negative.   Genitourinary: Negative.   Musculoskeletal: Negative.   Skin: Negative.   Neurological: Negative.   Psychiatric/Behavioral: Negative.    Objective:   Physical Exam  Constitutional: He is oriented to person, place, and time. He appears well-developed and well-nourished.  HENT:  Head: Normocephalic and atraumatic.  Neck: Normal range of motion. Neck supple.  Cardiovascular: Normal rate, regular rhythm, normal heart sounds and intact distal pulses.  Pulmonary/Chest: Effort normal and breath sounds normal.  Abdominal: Soft. Bowel sounds are normal.  Musculoskeletal: Normal range of motion.  Neurological: He is alert and oriented to person, place, and time.  Skin: Skin is warm and dry.     New skin lesion on left lower leg  Psychiatric: He has a normal mood and affect. His behavior is normal. Judgment and thought content normal.  Nursing note and vitals reviewed.   Assessment & Plan:   1. Gastrointestinal infection - ciprofloxacin (CIPRO) 500 MG tablet; Take 1 tablet (500 mg total) by mouth 2 (two) times daily.  Dispense: 14 tablet; Refill: 0  2. Generalized abdominal pain - POCT urinalysis dipstick  3. Chronic foot pain, right - meloxicam (MOBIC) 15 MG tablet; Take 1 tablet (15 mg total) by mouth daily.  Dispense: 30 tablet; Refill: 2 - traMADol (ULTRAM) 50 MG tablet; Take 1 tablet (50 mg total) by mouth 2 (two) times daily.  Dispense: 45 tablet; Refill: 0  4. Pruritus - hydrOXYzine (ATARAX/VISTARIL) 25 MG tablet; Take 1 tablet (25 mg total) by mouth 3 (three)  times daily as needed for itching.  Dispense: 90 tablet; Refill: 6  5. Recent skin changes - Ambulatory referral to Dermatology  6. Follow up He will follow up in 6 months.  Meds ordered this encounter  Medications  . meloxicam (MOBIC) 15 MG tablet    Sig: Take 1 tablet (15 mg total) by mouth daily.    Dispense:  30 tablet    Refill:  2  . DISCONTD: traMADol (ULTRAM) 50 MG tablet    Sig: Take 1 tablet (50 mg total) by mouth 2 (two) times daily.    Dispense:  45 tablet    Refill:  0    Order Specific Question:   Supervising Provider  AnswerQuentin Angst [id number]  . traMADol (ULTRAM) 50 MG tablet    Sig: Take 1 tablet (50 mg total) by mouth 2 (two) times daily.    Dispense:  45 tablet    Refill:  0    Order Specific Question:   Supervising Provider    Answer:   Quentin Angst [id number]  . ciprofloxacin (CIPRO) 500 MG tablet    Sig: Take 1 tablet (500 mg total) by mouth 2 (two) times daily.    Dispense:  14 tablet    Refill:  0  . hydrOXYzine (ATARAX/VISTARIL) 25 MG tablet    Sig: Take 1 tablet (25 mg total) by mouth 3 (three) times daily as needed for itching.    Dispense:  90 tablet    Refill:  6    Raliegh Ip,  MSN, [facility] Patient [facility] [address] [place], [place] [address] [phone]    Referral Orders     Ambulatory referral to Dermatology

## 2018-10-10 ENCOUNTER — Ambulatory Visit (INDEPENDENT_AMBULATORY_CARE_PROVIDER_SITE_OTHER): Payer: Self-pay | Admitting: Family Medicine

## 2018-10-10 ENCOUNTER — Encounter: Payer: Self-pay | Admitting: Family Medicine

## 2018-10-10 VITALS — BP 106/64 | HR 75 | Temp 98.3°F | Ht 73.0 in | Wt 253.0 lb

## 2018-10-10 DIAGNOSIS — Z09 Encounter for follow-up examination after completed treatment for conditions other than malignant neoplasm: Secondary | ICD-10-CM

## 2018-10-10 DIAGNOSIS — K219 Gastro-esophageal reflux disease without esophagitis: Secondary | ICD-10-CM

## 2018-10-10 DIAGNOSIS — A09 Infectious gastroenteritis and colitis, unspecified: Secondary | ICD-10-CM

## 2018-10-10 DIAGNOSIS — Z0000 Encounter for general adult medical examination without abnormal findings: Secondary | ICD-10-CM

## 2018-10-10 LAB — POCT URINALYSIS DIPSTICK
Bilirubin, UA: NEGATIVE
Blood, UA: NEGATIVE
Glucose, UA: NEGATIVE
Ketones, UA: NEGATIVE
Leukocytes, UA: NEGATIVE
Nitrite, UA: NEGATIVE
Protein, UA: NEGATIVE
Spec Grav, UA: 1.015 (ref 1.010–1.025)
Urobilinogen, UA: 0.2 E.U./dL
pH, UA: 6 (ref 5.0–8.0)

## 2018-10-10 MED ORDER — OMEPRAZOLE 20 MG PO CPDR: 20.0000 mg | DELAYED_RELEASE_CAPSULE | Freq: Two times a day (BID) | ORAL | 5 refills | Status: DC | PRN

## 2018-10-10 MED ORDER — CIPROFLOXACIN HCL 500 MG PO TABS: 500.0000 mg | ORAL_TABLET | Freq: Two times a day (BID) | ORAL | 0 refills | Status: DC

## 2018-10-10 MED FILL — CIPROFLOXACIN HCL 500 MG TA: 500 | 7 days supply | Qty: 14 | Fill #0

## 2018-10-10 MED FILL — OMEPRAZOLE 20 MG CAP: 20 | 30 days supply | Qty: 60 | Fill #0

## 2018-10-10 NOTE — Progress Notes (Signed)
Sick Visit  Subjective:    Patient ID: Todd Carroll, male    DOB: 01/11/1976, 42 y.o.   MRN: [id number]  Chief Complaint  Patient presents with  . Diarrhea  . Fever   HPI  [name] is a 42 year old male with a past medical history of GERD and Allergic Rhinitis. He is here today for assessment and follow up of chronic diseased.   Current Status: Since his last office visit, he continues to have mildly elevated temperatures, mainly at night. He has increased fatigue and feels lethargic. He reports that his abdominal pain has mostly subsided. Diarrhea has improved. He has more frequent episodes of acid reflux lately. Denies any other reports of GI problems such as nausea, vomiting, and constipation. He has no reports of blood in stools, dysuria and hematuria.  He denies fevers, chills, recent infections, weight loss, and night sweats. He has not had any headaches, visual changes, dizziness, and falls. No chest pain, heart palpitations, cough and shortness of breath reported.  No depression or anxiety reported. He denies pain today.   Past Medical History:  Diagnosis Date  . ALLERGIC RHINITIS 08/27/2007  . GERD 05/04/2008    History reviewed. No pertinent family history.  Social History   Socioeconomic History  . Marital status: Single    Spouse name: Not on file  . Number of children: Not on file  . Years of education: Not on file  . Highest education level: Not on file  Occupational History  . Not on file  Social Needs  . Financial resource strain: Not on file  . Food insecurity:    Worry: Not on file    Inability: Not on file  . Transportation needs:    Medical: Not on file    Non-medical: Not on file  Tobacco Use  . Smoking status: Never Smoker  . Smokeless tobacco: Never Used  Substance and Sexual Activity  . Alcohol use: Yes    Alcohol/week: 1.0 standard drinks    Types: 1 Standard drinks or equivalent per week    Comment: social , liquor  . Drug use: No  .  Sexual activity: Not Currently    Partners: Female, Male    Birth control/protection: Condom    Comment: pt declined condoms  Lifestyle  . Physical activity:    Days per week: Not on file    Minutes per session: Not on file  . Stress: Not on file  Relationships  . Social connections:    Talks on phone: Not on file    Gets together: Not on file    Attends religious service: Not on file    Active member of club or organization: Not on file    Attends meetings of clubs or organizations: Not on file    Relationship status: Not on file  . Intimate partner violence:    Fear of current or ex partner: Not on file    Emotionally abused: Not on file    Physically abused: Not on file    Forced sexual activity: Not on file  Other Topics Concern  . Not on file  Social History Narrative  . Not on file    Past Surgical History:  Procedure Laterality Date  . APPENDECTOMY      Immunization History  Administered Date(s) Administered  . HPV 9-valent 05/15/2018, 06/13/2018  . Hepatitis A 04/02/2013  . Hepatitis A, Adult 10/23/2013  . Hepatitis B 04/02/2013, 06/02/2013  . Hepatitis B, adult 01/05/2014  .  Influenza Split 09/14/2011  . Influenza,inj,Quad PF,6+ Mos 10/23/2013, 11/01/2014, 11/15/2015, 08/31/2016, 08/31/2016, 07/24/2017, 09/11/2018  . Meningococcal Mcv4o 03/06/2017, 09/12/2018  . PPD Test 03/19/2013  . Pneumococcal Conjugate-13 05/15/2018  . Pneumococcal Polysaccharide-23 03/19/2013  . Td 12/03/2006  . Tdap 07/24/2017    Current Meds  Medication Sig  . BIKTARVY 50-200-25 MG TABS tablet TAKE 1 TABLET BY MOUTH DAILY  . ciprofloxacin (CIPRO) 500 MG tablet Take 1 tablet (500 mg total) by mouth 2 (two) times daily.  . hydrocortisone 1 % ointment Apply 1 application topically 2 (two) times daily. To rash on legs  . hydrOXYzine (ATARAX/VISTARIL) 25 MG tablet Take 1 tablet (25 mg total) by mouth 3 (three) times daily as needed for itching.  . imiquimod (ALDARA) 5 % cream Apply  topically 3 (three) times a week. Apply at bedtime to wart, wash off in the morning. No longer than 8 hrs/nights  . meloxicam (MOBIC) 15 MG tablet Take 1 tablet (15 mg total) by mouth daily.  Marland Kitchen omeprazole (PRILOSEC) 20 MG capsule Take 1 capsule (20 mg total) by mouth 2 (two) times daily as needed.  . traMADol (ULTRAM) 50 MG tablet Take 1 tablet (50 mg total) by mouth 2 (two) times daily.  . valACYclovir (VALTREX) 500 MG tablet TAKE 1 TABLET BY MOUTH DAILY.  . [DISCONTINUED] ciprofloxacin (CIPRO) 500 MG tablet Take 1 tablet (500 mg total) by mouth 2 (two) times daily.  . [DISCONTINUED] omeprazole (PRILOSEC) 20 MG capsule TAKE 1 CAPSULE(20 MG) BY MOUTH DAILY    Allergies  Allergen Reactions  . Azithromycin Itching    BP 106/64 (BP Location: Right Arm, Patient Position: Sitting, Cuff Size: Small)   Pulse 75   Temp 98.3 F (36.8 C) (Oral)   Ht 6\' 1"  (1.854 m)   Wt 253 lb (114.8 kg)   SpO2 97%   BMI 33.38 kg/m    Review of Systems  Constitutional: Positive for fatigue (increased).  HENT: Negative.   Eyes: Negative.   Respiratory: Negative.   Cardiovascular: Negative.   Gastrointestinal: Positive for diarrhea (Occassional).  Endocrine: Negative.   Genitourinary: Negative.   Musculoskeletal: Negative.   Skin: Negative.   Neurological: Negative.   Psychiatric/Behavioral: Negative.    Objective:   Physical Exam  Constitutional: He is oriented to person, place, and time. He appears well-developed and well-nourished.  HENT:  Head: Normocephalic and atraumatic.  Eyes: EOM are normal.  Neck: Normal range of motion. Neck supple.  Cardiovascular: Normal rate, regular rhythm, normal heart sounds and intact distal pulses.  Pulmonary/Chest: Effort normal and breath sounds normal.  Abdominal: Soft. Bowel sounds are normal.  Musculoskeletal: Normal range of motion.  Neurological: He is alert and oriented to person, place, and time.  Skin: Skin is warm and dry.  Psychiatric: He has a  normal mood and affect. His behavior is normal. Judgment and thought content normal.  Nursing note and vitals reviewed.  Assessment & Plan:   1. Gastrointestinal infection - ciprofloxacin (CIPRO) 500 MG tablet; Take 1 tablet (500 mg total) by mouth 2 (two) times daily.  Dispense: 14 tablet; Refill: 0  2. Gastroesophageal reflux disease, esophagitis presence not specified We will increase frequency of dose of Prilosec today to 20 mg BID. - omeprazole (PRILOSEC) 20 MG capsule; Take 1 capsule (20 mg total) by mouth 2 (two) times daily as needed.  Dispense: 60 capsule; Refill: 5  3. Healthcare maintenance - CBC with Differential - Comprehensive metabolic panel - TSH - Urinalysis Dipstick  4. Follow up  He will keep follow up appointment with Debby Bud, NP on 04/2018.   Meds ordered this encounter  Medications  . ciprofloxacin (CIPRO) 500 MG tablet    Sig: Take 1 tablet (500 mg total) by mouth 2 (two) times daily.    Dispense:  14 tablet    Refill:  0  . omeprazole (PRILOSEC) 20 MG capsule    Sig: Take 1 capsule (20 mg total) by mouth 2 (two) times daily as needed.    Dispense:  60 capsule    Refill:  5   Raliegh Ip,  MSN, [facility] Patient [facility] [address] [place], [place] [address] [phone]

## 2018-10-11 LAB — TSH: TSH: 0.352 u[IU]/mL — ABNORMAL LOW (ref 0.450–4.500)

## 2018-10-11 LAB — COMPREHENSIVE METABOLIC PANEL
ALT: 42 IU/L (ref 0–44)
AST: 30 IU/L (ref 0–40)
Albumin/Globulin Ratio: 1.4 (ref 1.2–2.2)
Albumin: 4.3 g/dL (ref 3.5–5.5)
Alkaline Phosphatase: 86 IU/L (ref 39–117)
BUN/Creatinine Ratio: 8 — ABNORMAL LOW (ref 9–20)
BUN: 8 mg/dL (ref 6–24)
Bilirubin Total: <0.2 mg/dL (ref 0.0–1.2)
CO2: 24 mmol/L (ref 20–29)
Calcium: 9.3 mg/dL (ref 8.7–10.2)
Chloride: 103 mmol/L (ref 96–106)
Creatinine, Ser: 1.05 mg/dL (ref 0.76–1.27)
GFR calc Af Amer: 101 mL/min/{1.73_m2} (ref >59)
GFR calc non Af Amer: 88 mL/min/{1.73_m2} (ref >59)
Globulin, Total: 3 g/dL (ref 1.5–4.5)
Glucose: 93 mg/dL (ref 65–99)
Potassium: 4.6 mmol/L (ref 3.5–5.2)
Sodium: 141 mmol/L (ref 134–144)
Total Protein: 7.3 g/dL (ref 6.0–8.5)

## 2018-10-11 LAB — CBC WITH DIFFERENTIAL/PLATELET
Basophils Absolute: 0 10*3/uL (ref 0.0–0.2)
Basos: 1 %
EOS (ABSOLUTE): 0.2 10*3/uL (ref 0.0–0.4)
Eos: 3 %
Hematocrit: 35.7 % — ABNORMAL LOW (ref 37.5–51.0)
Hemoglobin: 12.7 g/dL — ABNORMAL LOW (ref 13.0–17.7)
Immature Grans (Abs): 0 10*3/uL (ref 0.0–0.1)
Immature Granulocytes: 0 %
Lymphocytes Absolute: 2.9 10*3/uL (ref 0.7–3.1)
Lymphs: 48 %
MCH: 31.1 pg (ref 26.6–33.0)
MCHC: 35.6 g/dL (ref 31.5–35.7)
MCV: 87 fL (ref 79–97)
Monocytes Absolute: 0.5 10*3/uL (ref 0.1–0.9)
Monocytes: 8 %
Neutrophils Absolute: 2.4 10*3/uL (ref 1.4–7.0)
Neutrophils: 40 %
Platelets: 457 10*3/uL — ABNORMAL HIGH (ref 150–450)
RBC: 4.09 x10E6/uL — ABNORMAL LOW (ref 4.14–5.80)
RDW: 12.7 % (ref 12.3–15.4)
WBC: 6.1 10*3/uL (ref 3.4–10.8)

## 2018-10-16 MED FILL — hydrOXYzine HCL 25 MG TABS: 25 | 30 days supply | Qty: 90 | Fill #0

## 2018-10-16 MED FILL — VALACYCLOVIR HCL 500 MG TAB: 500 | 30 days supply | Qty: 30 | Fill #4

## 2018-10-17 ENCOUNTER — Ambulatory Visit (INDEPENDENT_AMBULATORY_CARE_PROVIDER_SITE_OTHER): Payer: Self-pay | Admitting: Family Medicine

## 2018-10-17 ENCOUNTER — Ambulatory Visit (HOSPITAL_COMMUNITY)
Admission: RE | Admit: 2018-10-17 | Discharge: 2018-10-17 | Disposition: A | Discharge status: Home / Self Care | Payer: Self-pay | Source: Ambulatory Visit | Attending: Family Medicine | Admitting: Family Medicine

## 2018-10-17 ENCOUNTER — Encounter: Payer: Self-pay | Admitting: Family Medicine

## 2018-10-17 VITALS — BP 128/76 | HR 78 | Temp 98.3°F | Resp 16 | Ht 73.0 in | Wt 254.0 lb

## 2018-10-17 DIAGNOSIS — K219 Gastro-esophageal reflux disease without esophagitis: Secondary | ICD-10-CM

## 2018-10-17 DIAGNOSIS — K5900 Constipation, unspecified: Secondary | ICD-10-CM | POA: Insufficient documentation

## 2018-10-17 DIAGNOSIS — R1084 Generalized abdominal pain: Secondary | ICD-10-CM

## 2018-10-17 MED ORDER — FAMOTIDINE 40 MG PO TABS: 40.0000 mg | ORAL_TABLET | Freq: Every day | ORAL | 2 refills | Status: DC

## 2018-10-17 MED FILL — FAMOTIDINE 40 MG TABS: 40 | 30 days supply | Qty: 30 | Fill #0

## 2018-10-17 NOTE — Progress Notes (Signed)
  [facility]   Progress Note: Sick Visit Provider: Mike GipAndre Blanton Kardell, FNP  SUBJECTIVE:   Todd RainwaterChristopher M Carroll is a 42 y.o. male who  has a past medical history of ALLERGIC RHINITIS (08/27/2007) and GERD (05/04/2008).. Patient presents today for Abdominal Pain ("discomfort" ) Patient presents with generalized stomach pain x 1 month. States that he has been seen in the office on 11/1 and 11/08 with similar symptoms. He has been given 2 rounds of cipro with relief of diarrhea, but still has generalized stomach pain. Patient reports a "catching" in the left side that happens intermittently without cause. He states that it resolves on its own. Patient reports fevers at night.  He states that his stool has a foul odor that is unusual for him.  Review of Systems  Constitutional: Positive for fever.  Respiratory: Negative.   Cardiovascular: Negative.   Gastrointestinal: Positive for abdominal pain, constipation (hard stool) and heartburn. Negative for blood in stool and diarrhea.  Neurological: Negative.   Psychiatric/Behavioral: Negative.      OBJECTIVE: BP 128/76 (BP Location: Right Arm, Patient Position: Sitting, Cuff Size: Normal)   Pulse 78   Temp 98.3 F (36.8 C) (Oral)   Resp 16   Ht 6\' 1"  (1.854 m)   Wt 254 lb (115.2 kg)   SpO2 98%   BMI 33.51 kg/m   Physical Exam  Constitutional: He is oriented to person, place, and time. He appears well-developed and well-nourished. No distress.  HENT:  Head: Normocephalic and atraumatic.  Eyes: Pupils are equal, round, and reactive to light. Conjunctivae and EOM are normal.  Neck: Normal range of motion.  Cardiovascular: Normal rate, regular rhythm, normal heart sounds and intact distal pulses.  Pulmonary/Chest: Effort normal and breath sounds normal. No respiratory distress.  Abdominal: Soft. Bowel sounds are normal. He exhibits no distension. There is generalized tenderness.  Musculoskeletal:  Normal range of motion.  Neurological: He is alert and oriented to person, place, and time.  Skin: Skin is warm and dry.  Psychiatric: He has a normal mood and affect. His behavior is normal. Thought content normal.  Nursing note and vitals reviewed.   ASSESSMENT/PLAN:   1. Gastroesophageal reflux disease, esophagitis presence not specified Discussed eating foods that are less acidic in nature.  - Ambulatory referral to Gastroenterology - famotidine (PEPCID) 40 MG tablet; Take 1 tablet (40 mg total) by mouth at bedtime.  Dispense: 30 tablet; Refill: 2  2. Generalized abdominal pain Will refer to GI for further evaluation of symptoms. Patient is on PPIs that he is not taking regularly due to concerns of what he has heard in the community. Switched to famotidine.  - DG Abd 1 View; Future - Ambulatory referral to Gastroenterology - Cdiff NAA+O+P+Stool Culture - H Pylori, IGM, IGG, IGA AB       The patient was given clear instructions to go to ER or return to medical center if symptoms do not improve, worsen or new problems develop. The patient verbalized understanding and agreed with plan of care.   Ms. Freda Jacksonndr L. Riley Lamouglas, FNP-BC [facility] [address]  [place], [place] [id number] [phone]     This note has been created with Dragon speech recognition software and smart phrase technology. Any transcriptional errors are unintentional.

## 2018-10-17 NOTE — Patient Instructions (Signed)
I sent famotidine to the pharmacy for your GERD and stomach pain. You can take this along with omeprazole.  I sent a referral to the gastroenterologist for further evaluation.  I am testing for a bacterial infection call H. Pylori. This can cause stomach pain and ulcers.     Helicobacter Pylori Infection Helicobacter pylori infection is an infection in the stomach that is caused by the Helicobacter pylori (H. pylori) bacteria. This type of bacteria often lives in the lining of the stomach. The infection can cause ulcers and irritation (gastritis) in some people. It is the most common cause of ulcers in the stomach (gastric ulcer) and in the upper part of the intestine (duodenal ulcer). Having this infection may also increase the risk of stomach cancer and a type of white blood cell cancer (lymphoma) that affects the stomach. What are the causes? H. pylori is a type of bacteria that is often found in the stomachs of healthy people. The bacteria may be passed from person to person through contact with stool or saliva. It is not known why some people develop ulcers, gastritis, or cancer from the infection. What increases the risk? This condition is more likely to develop in people who:  Have family members with the infection.  Live with many other people, such as in a dormitory.  Are of African, Hispanic, or Asian descent.  What are the signs or symptoms? Most people with this infection do not have symptoms. If you do have symptoms, they may include:  Heartburn.  Stomach pain.  Nausea.  Vomiting.  Blood-tinged vomit.  Loss of appetite.  Bad breath.  How is this diagnosed? This condition may be diagnosed based on your symptoms, a physical exam, and various tests. Tests may include:  Blood tests or stool tests to check for the proteins (antibodies) that your body may produce in response to the bacteria. These tests are the best way to confirm the diagnosis.  A breath test to  check for the type of gas that the H. pylori bacteria release after breaking down a substance called urea. For the test, you are asked to drink urea. This test is often done after treatment in order to find out if the treatment worked.  A procedure in which a thin, flexible tube with a tiny camera at the end is placed into your stomach and upper intestine (upper endoscopy). Your health care provider may also take tissue samples (biopsy) to test for H. pylori and cancer.  How is this treated? Treatment for this condition usually involves taking a combination of medicines (triple therapy) for a couple of weeks. Triple therapy includes one medicine to reduce the acid in your stomach and two types of antibiotic medicines. Many drug combinations have been approved for treatment. Treatment usually kills the H. pylori and reduces your risk of cancer. You may need to be tested for H. pylori again after treatment. In some cases, the treatment may need to be repeated. Follow these instructions at home:  Take over-the-counter and prescription medicines only as told by your health care provider.  Take your antibiotics as told by your health care provider. Do not stop taking the antibiotics even if you start to feel better.  You can do all your usual activities and eat what you usually do.  Take steps to prevent future infections: ? Wash your hands often. ? Make sure the food you eat has been properly prepared. ? Drink water only from clean sources.  Keep all follow-up  visits as told by your health care provider. This is important. Contact a health care provider if:  Your symptoms do not get better.  Your symptoms return after treatment. This information is not intended to replace advice given to you by your health care provider. Make sure you discuss any questions you have with your health care provider. Document Released: 03/12/2016 Document Revised: 04/26/2016 Document Reviewed: 12/01/2014 Elsevier  Interactive Patient Education  2018 [employer].  Food Choices for Gastroesophageal Reflux Disease, Adult When you have gastroesophageal reflux disease (GERD), the foods you eat and your eating habits are very important. Choosing the right foods can help ease your discomfort. What guidelines do I need to follow?  Choose fruits, vegetables, whole grains, and low-fat dairy products.  Choose low-fat meat, fish, and poultry.  Limit fats such as oils, salad dressings, butter, nuts, and avocado.  Keep a food diary. This helps you identify foods that cause symptoms.  Avoid foods that cause symptoms. These may be different for everyone.  Eat small meals often instead of 3 large meals a day.  Eat your meals slowly, in a place where you are relaxed.  Limit fried foods.  Cook foods using methods other than frying.  Avoid drinking alcohol.  Avoid drinking large amounts of liquids with your meals.  Avoid bending over or lying down until 2-3 hours after eating. What foods are not recommended? These are some foods and drinks that may make your symptoms worse: Vegetables Tomatoes. Tomato juice. Tomato and spaghetti sauce. Chili peppers. Onion and garlic. Horseradish. Fruits Oranges, grapefruit, and lemon (fruit and juice). Meats High-fat meats, fish, and poultry. This includes hot dogs, ribs, ham, sausage, salami, and bacon. Dairy Whole milk and chocolate milk. Sour cream. Cream. Butter. Ice cream. Cream cheese. Drinks Coffee and tea. Bubbly (carbonated) drinks or energy drinks. Condiments Hot sauce. Barbecue sauce. Sweets/Desserts Chocolate and cocoa. Donuts. Peppermint and spearmint. Fats and Oils High-fat foods. This includes [place] fries and potato chips. Other Vinegar. Strong spices. This includes black pepper, white pepper, red pepper, cayenne, curry powder, cloves, ginger, and chili powder. The items listed above may not be a complete list of foods and drinks to avoid.  Contact your dietitian for more information. This information is not intended to replace advice given to you by your health care provider. Make sure you discuss any questions you have with your health care provider. Document Released: 05/20/2012 Document Revised: 04/26/2016 Document Reviewed: 09/23/2013 Elsevier Interactive Patient Education  2017 [employer].

## 2018-10-21 LAB — H PYLORI, IGM, IGG, IGA AB
H pylori, IgM Abs: <9 units (ref 0.0–8.9)
H. pylori, IgA Abs: 13.7 units — ABNORMAL HIGH (ref 0.0–8.9)
H. pylori, IgG AbS: 5.3 Index Value — ABNORMAL HIGH (ref 0.00–0.79)

## 2018-10-22 ENCOUNTER — Ambulatory Visit (INDEPENDENT_AMBULATORY_CARE_PROVIDER_SITE_OTHER): Payer: Self-pay | Admitting: Gastroenterology

## 2018-10-22 ENCOUNTER — Encounter: Payer: Self-pay | Admitting: Gastroenterology

## 2018-10-22 VITALS — BP 100/68 | HR 88 | Ht 73.0 in | Wt 252.2 lb

## 2018-10-22 DIAGNOSIS — R1310 Dysphagia, unspecified: Secondary | ICD-10-CM

## 2018-10-22 DIAGNOSIS — R1013 Epigastric pain: Secondary | ICD-10-CM

## 2018-10-22 DIAGNOSIS — K219 Gastro-esophageal reflux disease without esophagitis: Secondary | ICD-10-CM

## 2018-10-22 DIAGNOSIS — K5900 Constipation, unspecified: Secondary | ICD-10-CM

## 2018-10-22 MED ORDER — OMEPRAZOLE 20 MG PO CPDR: DELAYED_RELEASE_CAPSULE | ORAL | 5 refills | Status: DC

## 2018-10-22 NOTE — Patient Instructions (Signed)
Take 2 tablets of the Omeprazole 20 mg ( total 40 mg ) daily. Start daily fiber, Benefiber or Citrucel- 2 teaspoons in 8 oz of liquid.   You have been scheduled for an endoscopy. Please follow written instructions given to you at your visit today. If you use inhalers (even only as needed), please bring them with you on the day of your procedure. Normal BMI (Body Mass Index- based on height and weight) is between 19 and 25. Your BMI today is Body mass index is 33.27 kg/m. Marland Kitchen. Please consider follow up  regarding your BMI with your Primary Care Provider.

## 2018-10-22 NOTE — Progress Notes (Signed)
10/22/2018 [facility] [id number] 1976/07/10   HISTORY OF PRESENT ILLNESS: This is a 42 year old male who is new to our office.  He has past medical history of GERD and HIV for which he is on treatment.  He presents to our office today with complaints of reflux, epigastric abdominal pain, and difficulty swallowing solids and liquids at times.  He told me that he has had reflux for a very long time, but has been getting worse in the past 3 or 4 years.  About 4 weeks ago he had an episode of excruciating epigastric abdominal pain that lasted for about 5 to 8 minutes.  He says that this pain doubled him over.  It is just been sore and feels "hard" or bloated in his upper abdomen since that time.  Complains of feeling full quickly with sensation of nausea.  Did have some episodes of vomiting a couple of weeks ago, but none since then.  Says that when all this first started about 4 weeks ago he was having very dark-colored stools.  In regards to reflux treatment he is currently only on omeprazole 20 mg once daily.  He had started famotidine 40 mg at bedtime, but thought that it made him itchy so he discontinued it.  He does have meloxicam on his medication list and says that he takes it about once or twice a week for the past couple months.  He also tells me that overall he feels like he moves his bowels quite well.  Gets occasional diarrhea.  Recent abdominal x-ray for evaluation of his other symptoms showed increased stool burden throughout the colon, however, so he is wanting some advice about this.  Of note, he had H. pylori serologies drawn last week and it appears that his IgA and IgG were both positive, but IgM was negative.   Past Medical History:  Diagnosis Date  . ALLERGIC RHINITIS 08/27/2007  . GERD 05/04/2008   Past Surgical History:  Procedure Laterality Date  . APPENDECTOMY      reports that he has never smoked. He has never used smokeless tobacco. He reports that he drinks  alcohol. He reports that he does not use drugs. family history includes Diabetes in his mother; Multiple myeloma in his mother. Allergies  Allergen Reactions  . Azithromycin Itching      Outpatient Encounter Medications as of 10/22/2018  Medication Sig  . BIKTARVY 50-200-25 MG TABS tablet TAKE 1 TABLET BY MOUTH DAILY  . hydrOXYzine (ATARAX/VISTARIL) 25 MG tablet Take 1 tablet (25 mg total) by mouth 3 (three) times daily as needed for itching.  . meloxicam (MOBIC) 15 MG tablet Take 1 tablet (15 mg total) by mouth daily.  Marland Kitchen omeprazole (PRILOSEC) 20 MG capsule Take 1 capsule (20 mg total) by mouth 2 (two) times daily as needed.  . traMADol (ULTRAM) 50 MG tablet Take 1 tablet (50 mg total) by mouth 2 (two) times daily.  . valACYclovir (VALTREX) 500 MG tablet TAKE 1 TABLET BY MOUTH DAILY.  . famotidine (PEPCID) 40 MG tablet Take 1 tablet (40 mg total) by mouth at bedtime. (Patient not taking: Reported on 10/22/2018)   No facility-administered encounter medications on file as of 10/22/2018.      REVIEW OF SYSTEMS  : All other systems reviewed and negative except where noted in the History of Present Illness.   PHYSICAL EXAM: BP 100/68   Pulse 88   Ht 6' 1"  (1.854 m)   Wt 252 lb 3.2 oz (  114.4 kg)   BMI 33.27 kg/m  General: Well developed black male in no acute distress Head: Normocephalic and atraumatic Eyes:  Sclerae anicteric, conjunctive pink. Ears: Normal auditory acuity Lungs: Clear throughout to auscultation; no increased WOB. Heart: Regular rate and rhythm; no M/R/G. Abdomen: Soft, non-distended.  BS present.  Epigastric TTP. Musculoskeletal: Symmetrical with no gross deformities  Skin: No lesions on visible extremities Extremities: No edema  Neurological: Alert oriented x 4, grossly non-focal Psychological:  Alert and cooperative. Normal mood and affect  ASSESSMENT AND PLAN: *42 year old male with complaints of long-standing GERD and now recent epigastric abdominal pain  and dysphasia to both solids and liquids at times.  I am going to have him increase his omeprazole to 40 mg total daily.  He can either take 20 mg twice daily or 40 mg at one time.  We will plan for EGD with Dr. Henrene Pastor as well.  Advised on minimizing use of meloxicam as much as possible. *Alternating bowel habits: He feels like for the most part he moves his bowels pretty well.  Gets occasional diarrhea.  Recent abdominal x-ray showed incrased stool burden throughout the colon, however.  Would just start by increasing dietary fiber as well as making sure he is getting adequate water/fluid intake.  Also advised on possibly starting a daily powder fiber supplement such as Benefiber or Citrucel, 2 teaspoons in 8 ounces of liquid daily.  **The risks, benefits, and alternatives to EGD were discussed with the patient and he consents to proceed.  CC:  Lanae Boast, Sophia

## 2018-10-23 ENCOUNTER — Encounter: Payer: Self-pay | Admitting: Gastroenterology

## 2018-10-23 DIAGNOSIS — K5900 Constipation, unspecified: Secondary | ICD-10-CM | POA: Insufficient documentation

## 2018-10-23 DIAGNOSIS — R1013 Epigastric pain: Secondary | ICD-10-CM | POA: Insufficient documentation

## 2018-10-23 DIAGNOSIS — R1310 Dysphagia, unspecified: Secondary | ICD-10-CM | POA: Insufficient documentation

## 2018-10-24 ENCOUNTER — Ambulatory Visit (AMBULATORY_SURGERY_CENTER): Payer: Self-pay | Admitting: Internal Medicine

## 2018-10-24 ENCOUNTER — Encounter: Payer: Self-pay | Admitting: Internal Medicine

## 2018-10-24 VITALS — BP 118/80 | HR 79 | Temp 98.4°F | Resp 17 | Ht 73.0 in | Wt 252.0 lb

## 2018-10-24 DIAGNOSIS — K219 Gastro-esophageal reflux disease without esophagitis: Secondary | ICD-10-CM

## 2018-10-24 DIAGNOSIS — B9681 Helicobacter pylori [H. pylori] as the cause of diseases classified elsewhere: Secondary | ICD-10-CM

## 2018-10-24 DIAGNOSIS — R1310 Dysphagia, unspecified: Secondary | ICD-10-CM

## 2018-10-24 DIAGNOSIS — R1013 Epigastric pain: Secondary | ICD-10-CM

## 2018-10-24 LAB — CDIFF NAA+O+P+STOOL CULTURE
E coli, Shiga toxin Assay: NEGATIVE
Toxigenic C. Difficile by PCR: NEGATIVE

## 2018-10-24 MED ORDER — SODIUM CHLORIDE 0.9 % IV SOLN: 500.0000 mL | Freq: Once | INTRAVENOUS | Status: DC

## 2018-10-24 NOTE — Progress Notes (Signed)
Assessment and plan reviewed 

## 2018-10-24 NOTE — Op Note (Signed)
[facility] Patient Name: Todd Carroll Procedure Date: 10/24/2018 4:06 PM MRN: [id number] Endoscopist: Wilhemina Bonito. Marina Goodell , MD Age: 42 Referring MD:  Date of Birth: 1976/03/12 Gender: Male Account #: [id number] Procedure:                Upper GI endoscopy with biopsies Indications:              Epigastric abdominal pain, Dysphagia (vague),                            Esophageal reflux. Equivocal H. pylori serology Medicines:                Monitored Anesthesia Care Procedure:                Pre-Anesthesia Assessment:                           - Prior to the procedure, a History and Physical                            was performed, and patient medications and                            allergies were reviewed. The patient's tolerance of                            previous anesthesia was also reviewed. The risks                            and benefits of the procedure and the sedation                            options and risks were discussed with the patient.                            All questions were answered, and informed consent                            was obtained. Prior Anticoagulants: The patient has                            taken no previous anticoagulant or antiplatelet                            agents. ASA Grade Assessment: II - A patient with                            mild systemic disease. After reviewing the risks                            and benefits, the patient was deemed in                            satisfactory condition to undergo the procedure.  After obtaining informed consent, the endoscope was                            passed under direct vision. Throughout the                            procedure, the patient's blood pressure, pulse, and                            oxygen saturations were monitored continuously. The                            Model GIF-HQ190 [id number]) scope was introduced       through the mouth, and advanced to the second part                            of duodenum. The upper GI endoscopy was                            accomplished without difficulty. The patient                            tolerated the procedure well. Scope In: Scope Out: Findings:                 The esophagus revealed mild esophagitis in the                            region of the Z line.                           The stomach was normal. Biopsies were taken with a                            cold forceps for Helicobacter pylori testing using                            CLOtest.                           The examined duodenum was normal.                           The cardia and gastric fundus were normal on                            retroflexion. Complications:            No immediate complications. Estimated Blood Loss:     Estimated blood loss: none. Impression:               1. GERD with mild esophagitis                           2. Equivocal H. pylori serology status post CLO  biopsy                           3. Otherwise normal EGD. Recommendation:           1. Continue omeprazole at recently changed dose of                            40 mg daily                           2. Schedule abdominal ultrasound "epigastric pain,                            evaluate". We will contact you with the results                           3. Follow-up CLO biopsy                           4. Return to the care of your primary provider. Wilhemina Bonito. Marina Goodell, MD 10/24/2018 4:21:34 PM This report has been signed electronically.

## 2018-10-24 NOTE — Progress Notes (Signed)
Called to room to assist during endoscopic procedure.  Patient ID and intended procedure confirmed with present staff. Received instructions for my participation in the procedure from the performing physician.  

## 2018-10-24 NOTE — Patient Instructions (Signed)
Read recommendations on last page of your report: GERD with mild esophagitis.  GERD protocol given to pt. Schedule abdominal US "epigastric pain, evaluate"   YOU HAD AN ENDOSCOPIC PROCEDURE TODAY AT THE [facility]:   Refer to the procedure report that was given to you for any specific questions about what was found during the examination.  If the procedure report does not answer your questions, please call your gastroenterologist to clarify.  If you requested that your care partner not be given the details of your procedure findings, then the procedure report has been included in a sealed envelope for you to review at your convenience later.  YOU SHOULD EXPECT: Some feelings of bloating in the abdomen. Passage of more gas than usual.  Walking can help get rid of the air that was put into your GI tract during the procedure and reduce the bloating. If you had a lower endoscopy (such as a colonoscopy or flexible sigmoidoscopy) you may notice spotting of blood in your stool or on the toilet paper. If you underwent a bowel prep for your procedure, you may not have a normal bowel movement for a few days.  Please Note:  You might notice some irritation and congestion in your nose or some drainage.  This is from the oxygen used during your procedure.  There is no need for concern and it should clear up in a day or so.  SYMPTOMS TO REPORT IMMEDIATELY:   Following upper endoscopy (EGD)  Vomiting of blood or coffee ground material  New chest pain or pain under the shoulder blades  Painful or persistently difficult swallowing  New shortness of breath  Fever of 100F or higher  Black, tarry-looking stools  For urgent or emergent issues, a gastroenterologist can be reached at any hour by calling [phone].   DIET:  We do recommend a small meal at first, but then you may proceed to your regular diet.  Drink plenty of fluids but you should avoid alcoholic beverages for 24  hours.  ACTIVITY:  You should plan to take it easy for the rest of today and you should NOT DRIVE or use heavy machinery until tomorrow (because of the sedation medicines used during the test).    FOLLOW UP: Our staff will call the number listed on your records the next business day following your procedure to check on you and address any questions or concerns that you may have regarding the information given to you following your procedure. If we do not reach you, we will leave a message.  However, if you are feeling well and you are not experiencing any problems, there is no need to return our call.  We will assume that you have returned to your regular daily activities without incident.  If any biopsies were taken you will be contacted by phone or by letter within the next 1-3 weeks.  Please call us at [phone] if you have not heard about the biopsies in 3 weeks.    SIGNATURES/CONFIDENTIALITY: You and/or your care partner have signed paperwork which will be entered into your electronic medical record.  These signatures attest to the fact that that the information above on your After Visit Summary has been reviewed and is understood.  Full responsibility of the confidentiality of this discharge information lies with you and/or your care-partner.

## 2018-10-27 ENCOUNTER — Other Ambulatory Visit: Payer: Self-pay

## 2018-10-27 ENCOUNTER — Telehealth: Payer: Self-pay | Admitting: *Deleted

## 2018-10-27 ENCOUNTER — Telehealth: Payer: Self-pay

## 2018-10-27 DIAGNOSIS — R1013 Epigastric pain: Secondary | ICD-10-CM

## 2018-10-27 LAB — HELICOBACTER PYLORI SCREEN-BIOPSY: UREASE: POSITIVE — AB

## 2018-10-27 MED ORDER — TETRACYCLINE HCL 500 MG PO CAPS: 500.0000 mg | ORAL_CAPSULE | Freq: Four times a day (QID) | ORAL | 0 refills | Status: DC

## 2018-10-27 MED ORDER — METRONIDAZOLE 250 MG PO TABS: 250.0000 mg | ORAL_TABLET | Freq: Four times a day (QID) | ORAL | 0 refills | Status: DC

## 2018-10-27 MED FILL — metroNIDAZOLE 250 MG TABS: 250 | 14 days supply | Qty: 56 | Fill #0

## 2018-10-27 MED FILL — TETRACYCLINE 500 MG CAPSULE: 500 | 14 days supply | Qty: 56 | Fill #0

## 2018-10-27 NOTE — Telephone Encounter (Signed)
No voicemail, unable to leave message on f/u call  

## 2018-10-27 NOTE — Telephone Encounter (Signed)
Pt scheduled for ABD US at [facility] 10/31/18@9am , pt to arrive there at 8:45am. Pt to be NPO after midnight. Pt aware of appt.

## 2018-10-27 NOTE — Telephone Encounter (Signed)
  Follow up Call-  Call back number 10/24/2018  Post procedure Call Back phone  # [phone]   Permission to leave phone message Yes  Some recent data might be hidden     Patient questions:  Do you have a fever, pain , or abdominal swelling? No. Pain Score  0 *  Have you tolerated food without any problems? Yes.    Have you been able to return to your normal activities? Yes.    Do you have any questions about your discharge instructions: Diet   No. Medications  No. Follow up visit  Yes.  Pt. Wondered when about the follow-up ultrasound.  Told pt. Dr.Perry's staff will be getting in touch with him about scheduling the recommended ultrasound.  Do you have questions or concerns about your Care? Yes.   Pt. Asked when he would hear from the CLO test results.  Told pt. He would be getting a letter in about 2 weeks with the results, and a time recommended for a follow-up appointment.  Told pt. To call if he has any further questions or concerns. Actions: * If pain score is 4 or above: No action needed, pain <4.

## 2018-10-31 ENCOUNTER — Ambulatory Visit (HOSPITAL_COMMUNITY)
Admission: RE | Admit: 2018-10-31 | Discharge: 2018-10-31 | Disposition: A | Discharge status: Home / Self Care | Payer: Self-pay | Source: Ambulatory Visit | Attending: Internal Medicine | Admitting: Internal Medicine

## 2018-10-31 DIAGNOSIS — R1013 Epigastric pain: Secondary | ICD-10-CM | POA: Insufficient documentation

## 2018-10-31 DIAGNOSIS — N281 Cyst of kidney, acquired: Secondary | ICD-10-CM | POA: Insufficient documentation

## 2018-10-31 DIAGNOSIS — K7689 Other specified diseases of liver: Secondary | ICD-10-CM | POA: Insufficient documentation

## 2018-10-31 DIAGNOSIS — N200 Calculus of kidney: Secondary | ICD-10-CM | POA: Insufficient documentation

## 2018-11-03 ENCOUNTER — Telehealth: Payer: Self-pay

## 2018-11-03 NOTE — Telephone Encounter (Signed)
-----   Message from Mike GipAndre Douglas, FNP sent at 11/03/2018  3:32 PM EST ----- Patient negative for active h.pylori or ifectious diarrhea. Continue with medications as prescribed and hydration.

## 2018-11-03 NOTE — Telephone Encounter (Signed)
Patient will not accept phone calls or mail. Will notify when patient returns call. Thanks!

## 2018-11-04 MED FILL — OMEPRAZOLE 20 MG CAP: 20 | 30 days supply | Qty: 60 | Fill #1

## 2018-11-07 ENCOUNTER — Encounter: Payer: Self-pay | Admitting: Family Medicine

## 2018-11-07 ENCOUNTER — Ambulatory Visit (INDEPENDENT_AMBULATORY_CARE_PROVIDER_SITE_OTHER): Payer: Self-pay | Admitting: Family Medicine

## 2018-11-07 VITALS — BP 118/76 | HR 84 | Temp 98.3°F | Ht 73.0 in | Wt 252.0 lb

## 2018-11-07 DIAGNOSIS — J029 Acute pharyngitis, unspecified: Secondary | ICD-10-CM

## 2018-11-07 LAB — POCT RAPID STREP A (OFFICE): Rapid Strep A Screen: NEGATIVE

## 2018-11-07 NOTE — Patient Instructions (Signed)
Start back on your zyrtec and increase your water intake. Warm salt water gargles.    Pharyngitis Pharyngitis is a sore throat (pharynx). There is redness, pain, and swelling of your throat. Follow these instructions at home:  Drink enough fluids to keep your pee (urine) clear or pale yellow.  Only take medicine as told by your doctor. ? You may get sick again if you do not take medicine as told. Finish your medicines, even if you start to feel better. ? Do not take aspirin.  Rest.  Rinse your mouth (gargle) with salt water ( tsp of salt per 1 qt of water) every 1-2 hours. This will help the pain.  If you are not at risk for choking, you can suck on hard candy or sore throat lozenges. Contact a doctor if:  You have large, tender lumps on your neck.  You have a rash.  You cough up green, yellow-brown, or bloody spit. Get help right away if:  You have a stiff neck.  You drool or cannot swallow liquids.  You throw up (vomit) or are not able to keep medicine or liquids down.  You have very bad pain that does not go away with medicine.  You have problems breathing (not from a stuffy nose). This information is not intended to replace advice given to you by your health care provider. Make sure you discuss any questions you have with your health care provider. Document Released: 05/07/2008 Document Revised: 04/26/2016 Document Reviewed: 07/27/2013 Elsevier Interactive Patient Education  2017 [employer]

## 2018-11-07 NOTE — Progress Notes (Signed)
Acute Office Visit  Subjective:    Patient ID: Todd Carroll, male    DOB: Oct 21, 1976, 42 y.o.   MRN: [id number]  Chief Complaint  Patient presents with  . Sore Throat    1 week    HPI Patient is in today for sore throat. He has a history of allergic rhinitis and GERD. Not currently taking medications. Denies cough and congestion. Has not taken any otc medications to help with symptoms.  Past Medical History:  Diagnosis Date  . ALLERGIC RHINITIS 08/27/2007  . Allergy   . GERD 05/04/2008    Past Surgical History:  Procedure Laterality Date  . APPENDECTOMY      Family History  Problem Relation Age of Onset  . Multiple myeloma Mother   . Diabetes Mother   . Colon cancer Neg Hx   . Esophageal cancer Neg Hx   . Stomach cancer Neg Hx   . Pancreatic cancer Neg Hx   . Liver disease Neg Hx   . Rectal cancer Neg Hx     Social History   Socioeconomic History  . Marital status: Single    Spouse name: Not on file  . Number of children: Not on file  . Years of education: Not on file  . Highest education level: Not on file  Occupational History  . Not on file  Social Needs  . Financial resource strain: Not on file  . Food insecurity:    Worry: Not on file    Inability: Not on file  . Transportation needs:    Medical: Not on file    Non-medical: Not on file  Tobacco Use  . Smoking status: Never Smoker  . Smokeless tobacco: Never Used  Substance and Sexual Activity  . Alcohol use: Yes    Comment: social , liquor-- 1 drink every month to 2 months  . Drug use: No  . Sexual activity: Not Currently    Partners: Female, Male    Birth control/protection: Condom    Comment: pt declined condoms  Lifestyle  . Physical activity:    Days per week: Not on file    Minutes per session: Not on file  . Stress: Not on file  Relationships  . Social connections:    Talks on phone: Not on file    Gets together: Not on file    Attends religious service: Not on file     Active member of club or organization: Not on file    Attends meetings of clubs or organizations: Not on file    Relationship status: Not on file  . Intimate partner violence:    Fear of current or ex partner: Not on file    Emotionally abused: Not on file    Physically abused: Not on file    Forced sexual activity: Not on file  Other Topics Concern  . Not on file  Social History Narrative  . Not on file    Outpatient Medications Prior to Visit  Medication Sig Dispense Refill  . BIKTARVY 50-200-25 MG TABS tablet TAKE 1 TABLET BY MOUTH DAILY 30 tablet 5  . hydrOXYzine (ATARAX/VISTARIL) 25 MG tablet Take 1 tablet (25 mg total) by mouth 3 (three) times daily as needed for itching. 90 tablet 6  . meloxicam (MOBIC) 15 MG tablet Take 1 tablet (15 mg total) by mouth daily. 30 tablet 2  . metroNIDAZOLE (FLAGYL) 250 MG tablet Take 1 tablet (250 mg total) by mouth 4 (four) times daily. 56 tablet 0  .  omeprazole (PRILOSEC) 20 MG capsule Take 2 tablets ( total 40 mg) every morning before eating. 60 capsule 5  . tetracycline (ACHROMYCIN,SUMYCIN) 500 MG capsule Take 1 capsule (500 mg total) by mouth 4 (four) times daily. 56 capsule 0  . famotidine (PEPCID) 40 MG tablet Take 1 tablet (40 mg total) by mouth at bedtime. (Patient not taking: Reported on 10/22/2018) 30 tablet 2  . traMADol (ULTRAM) 50 MG tablet Take 1 tablet (50 mg total) by mouth 2 (two) times daily. (Patient not taking: Reported on 11/07/2018) 45 tablet 0  . valACYclovir (VALTREX) 500 MG tablet TAKE 1 TABLET BY MOUTH DAILY. (Patient not taking: Reported on 11/07/2018) 30 tablet 4   No facility-administered medications prior to visit.     Allergies  Allergen Reactions  . Azithromycin Itching    Review of Systems  Constitutional: Negative.   HENT: Positive for sore throat. Negative for congestion and sinus pain.   Eyes: Negative.   Respiratory: Negative.  Negative for cough and sputum production.   Cardiovascular: Negative.    Gastrointestinal: Negative.   Genitourinary: Negative.   Musculoskeletal: Negative.   Skin: Negative.   Neurological: Negative.   Psychiatric/Behavioral: Negative.        Objective:    Physical Exam  Constitutional: He is oriented to person, place, and time. He appears well-developed and well-nourished. No distress.  HENT:  Head: Normocephalic and atraumatic.  Eyes: Pupils are equal, round, and reactive to light. Conjunctivae and EOM are normal.  Neck: Normal range of motion.  Cardiovascular: Normal rate, regular rhythm and normal heart sounds.  Pulmonary/Chest: Effort normal and breath sounds normal. No respiratory distress.  Musculoskeletal: Normal range of motion.  Neurological: He is alert and oriented to person, place, and time.  Skin: Skin is warm and dry.  Psychiatric: He has a normal mood and affect. His behavior is normal. Judgment and thought content normal.  Nursing note and vitals reviewed.   BP 118/76 (BP Location: Left Arm, Patient Position: Sitting, Cuff Size: Large)   Pulse 84   Temp 98.3 F (36.8 C) (Oral)   Ht _0  (1.854 m)   Wt 252 lb (114.3 kg)   SpO2 96%   BMI 33.25 kg/m  Wt Readings from Last 3 Encounters:  11/07/18 252 lb (114.3 kg)  10/24/18 252 lb (114.3 kg)  10/22/18 252 lb 3.2 oz (114.4 kg)    There are no preventive care reminders to display for this patient.  There are no preventive care reminders to display for this patient.   Lab Results  Component Value Date   TSH 0.352 (L) 10/10/2018   Lab Results  Component Value Date   WBC 6.1 10/10/2018   HGB 12.7 (L) 10/10/2018   HCT 35.7 (L) 10/10/2018   MCV 87 10/10/2018   PLT 457 (H) 10/10/2018   Lab Results  Component Value Date   NA 141 10/10/2018   K 4.6 10/10/2018   CO2 24 10/10/2018   GLUCOSE 93 10/10/2018   BUN 8 10/10/2018   CREATININE 1.05 10/10/2018   BILITOT <0.2 10/10/2018   ALKPHOS 86 10/10/2018   AST 30 10/10/2018   ALT 42 10/10/2018   PROT 7.3 10/10/2018    ALBUMIN 4.3 10/10/2018   CALCIUM 9.3 10/10/2018   Lab Results  Component Value Date   CHOL 202 (H) 09/12/2018   Lab Results  Component Value Date   HDL 49 09/12/2018   Lab Results  Component Value Date   LDLCALC 134 (H) 09/12/2018  Lab Results  Component Value Date   TRIG 87 09/12/2018   Lab Results  Component Value Date   CHOLHDL 4.1 09/12/2018   Lab Results  Component Value Date   HGBA1C 5.6 07/31/2018       Assessment & Plan:   Problem List Items Addressed This Visit    None    Visit Diagnoses    Sore throat    -  Primary   Relevant Orders   POCT rapid strep A (Completed)    1. Sore throat Negative Strep. Comfort measures discussed. Instructed to take allergy medications. Warm salt water gargles.  - POCT rapid strep A      Lanae Boast, FNP

## 2018-11-10 ENCOUNTER — Ambulatory Visit: Payer: Self-pay | Attending: Family Medicine

## 2018-11-10 ENCOUNTER — Other Ambulatory Visit: Payer: Self-pay | Admitting: Family Medicine

## 2018-11-10 DIAGNOSIS — A6001 Herpesviral infection of penis: Secondary | ICD-10-CM

## 2018-11-18 ENCOUNTER — Telehealth: Payer: Self-pay

## 2018-11-18 ENCOUNTER — Other Ambulatory Visit: Payer: Self-pay | Admitting: Internal Medicine

## 2018-11-18 NOTE — Telephone Encounter (Signed)
Called patient to schedule follow-up appointment with Dr. Drue SecondSnider. Patient was able to take my call, but refused appointments offered to him. Will call office at another time to schedule appointments. Lorenso CourierJose L Maldonado, [place]

## 2018-11-25 ENCOUNTER — Other Ambulatory Visit: Payer: Self-pay | Admitting: Family Medicine

## 2018-11-25 DIAGNOSIS — L299 Pruritus, unspecified: Secondary | ICD-10-CM

## 2018-11-27 MED FILL — hydrOXYzine HCL 25 MG TABS: 25 | 30 days supply | Qty: 90 | Fill #1

## 2018-11-27 MED FILL — VALACYCLOVIR HCL 500 MG TAB: 500 | 30 days supply | Qty: 30 | Fill #0

## 2018-12-10 ENCOUNTER — Other Ambulatory Visit: Payer: Self-pay | Admitting: Internal Medicine

## 2018-12-11 ENCOUNTER — Other Ambulatory Visit: Payer: Self-pay | Admitting: Family Medicine

## 2018-12-16 ENCOUNTER — Other Ambulatory Visit: Payer: Self-pay | Admitting: Family Medicine

## 2018-12-16 ENCOUNTER — Telehealth: Payer: Self-pay

## 2018-12-16 DIAGNOSIS — R7989 Other specified abnormal findings of blood chemistry: Secondary | ICD-10-CM

## 2018-12-16 NOTE — Telephone Encounter (Signed)
-----   Message from Kallie Locks, FNP sent at 12/16/2018  8:07 AM EST ----- Regarding: "Repeat Thyroid Panel" Thyroid level was mildly decreased. Please inform patient to schedule appointment for Thyroid Panel asap. Thank you.

## 2018-12-16 NOTE — Telephone Encounter (Signed)
Called, no answer and no voicemail set up. There is a note to Not send mail to patient's address. I will try to call later. Thanks!

## 2018-12-18 ENCOUNTER — Other Ambulatory Visit: Payer: Self-pay

## 2018-12-19 ENCOUNTER — Other Ambulatory Visit: Payer: Self-pay

## 2018-12-19 DIAGNOSIS — B20 Human immunodeficiency virus [HIV] disease: Secondary | ICD-10-CM

## 2018-12-19 LAB — T-HELPER CELL (CD4) - (RCID CLINIC ONLY)
CD4 % Helper T Cell: 39 % (ref 33–55)
CD4 T Cell Abs: 1070 /uL (ref 400–2700)

## 2018-12-19 MED FILL — OMEPRAZOLE 20 MG CAP: 20 | 30 days supply | Qty: 60 | Fill #2

## 2018-12-19 MED FILL — FAMOTIDINE 40 MG TABS: 40 | 30 days supply | Qty: 30 | Fill #1

## 2018-12-22 ENCOUNTER — Ambulatory Visit: Payer: Self-pay | Attending: Family Medicine

## 2018-12-22 LAB — URINE CYTOLOGY ANCILLARY ONLY
CHLAMYDIA, DNA PROBE: NEGATIVE
Neisseria Gonorrhea: NEGATIVE

## 2018-12-23 LAB — CBC WITH DIFFERENTIAL/PLATELET
Absolute Monocytes: 628 cells/uL (ref 200–950)
BASOS PCT: 0.3 %
Basophils Absolute: 18 cells/uL (ref 0–200)
Eosinophils Absolute: 220 cells/uL (ref 15–500)
Eosinophils Relative: 3.6 %
HCT: 41.9 % (ref 38.5–50.0)
Hemoglobin: 14.4 g/dL (ref 13.2–17.1)
Lymphs Abs: 2672 cells/uL (ref 850–3900)
MCH: 30.5 pg (ref 27.0–33.0)
MCHC: 34.4 g/dL (ref 32.0–36.0)
MCV: 88.8 fL (ref 80.0–100.0)
MPV: 10 fL (ref 7.5–12.5)
Monocytes Relative: 10.3 %
Neutro Abs: 2562 cells/uL (ref 1500–7800)
Neutrophils Relative %: 42 %
Platelets: 391 10*3/uL (ref 140–400)
RBC: 4.72 10*6/uL (ref 4.20–5.80)
RDW: 13.2 % (ref 11.0–15.0)
Total Lymphocyte: 43.8 %
WBC: 6.1 10*3/uL (ref 3.8–10.8)

## 2018-12-23 LAB — COMPLETE METABOLIC PANEL WITH GFR
AG Ratio: 1.3 (calc) (ref 1.0–2.5)
ALT: 20 U/L (ref 9–46)
AST: 19 U/L (ref 10–40)
Albumin: 4.3 g/dL (ref 3.6–5.1)
Alkaline phosphatase (APISO): 88 U/L (ref 40–115)
BUN: 10 mg/dL (ref 7–25)
CHLORIDE: 102 mmol/L (ref 98–110)
CO2: 31 mmol/L (ref 20–32)
Calcium: 9.3 mg/dL (ref 8.6–10.3)
Creat: 1 mg/dL (ref 0.60–1.35)
GFR, Est African American: 107 mL/min/{1.73_m2} (ref >=60)
GFR, Est Non African American: 92 mL/min/{1.73_m2} (ref >=60)
Globulin: 3.3 g/dL (calc) (ref 1.9–3.7)
Glucose, Bld: 100 mg/dL — ABNORMAL HIGH (ref 65–99)
Potassium: 4.3 mmol/L (ref 3.5–5.3)
Sodium: 138 mmol/L (ref 135–146)
Total Bilirubin: 0.3 mg/dL (ref 0.2–1.2)
Total Protein: 7.6 g/dL (ref 6.1–8.1)

## 2018-12-23 LAB — HIV-1 RNA QUANT-NO REFLEX-BLD
HIV 1 RNA Quant: <20 copies/mL
HIV-1 RNA Quant, Log: <1.3 Log copies/mL

## 2018-12-23 LAB — RPR: RPR Ser Ql: NONREACTIVE

## 2018-12-30 MED FILL — ?VALACYCLOVIR HCL 500MG TAB: 500 | 30 days supply | Qty: 30 | Fill #1

## 2018-12-30 MED FILL — hydrOXYzine HCL 25 MG TABS: 25 | 30 days supply | Qty: 90 | Fill #2

## 2018-12-31 ENCOUNTER — Encounter: Payer: Self-pay | Admitting: Internal Medicine

## 2018-12-31 ENCOUNTER — Ambulatory Visit (INDEPENDENT_AMBULATORY_CARE_PROVIDER_SITE_OTHER): Payer: Self-pay | Admitting: Internal Medicine

## 2018-12-31 VITALS — BP 104/77 | HR 69 | Temp 98.6°F | Ht 73.0 in | Wt 249.5 lb

## 2018-12-31 DIAGNOSIS — R21 Rash and other nonspecific skin eruption: Secondary | ICD-10-CM

## 2018-12-31 DIAGNOSIS — K219 Gastro-esophageal reflux disease without esophagitis: Secondary | ICD-10-CM

## 2018-12-31 DIAGNOSIS — J029 Acute pharyngitis, unspecified: Secondary | ICD-10-CM

## 2018-12-31 DIAGNOSIS — B20 Human immunodeficiency virus [HIV] disease: Secondary | ICD-10-CM

## 2018-12-31 DIAGNOSIS — R319 Hematuria, unspecified: Secondary | ICD-10-CM

## 2018-12-31 DIAGNOSIS — Z23 Encounter for immunization: Secondary | ICD-10-CM

## 2018-12-31 LAB — URINALYSIS, ROUTINE W REFLEX MICROSCOPIC
Bilirubin Urine: NEGATIVE
GLUCOSE, UA: NEGATIVE
Hgb urine dipstick: NEGATIVE
Ketones, ur: NEGATIVE
Leukocytes, UA: NEGATIVE
Nitrite: NEGATIVE
PROTEIN: NEGATIVE
Specific Gravity, Urine: 1.019 (ref 1.001–1.03)
pH: 6 (ref 5.0–8.0)

## 2018-12-31 MED ORDER — OMEPRAZOLE 20 MG PO CPDR: DELAYED_RELEASE_CAPSULE | ORAL | 5 refills | Status: DC

## 2018-12-31 MED ORDER — HYDROCORTISONE 1 % EX OINT: 1.0000 "application " | TOPICAL_OINTMENT | Freq: Two times a day (BID) | CUTANEOUS | 0 refills | Status: DC

## 2018-12-31 MED ORDER — ONE-A-DAY MENS PO TABS: 1.0000 | ORAL_TABLET | Freq: Every day | ORAL | 0 refills | Status: DC

## 2018-12-31 NOTE — Progress Notes (Signed)
RFV: follow up for hiv disease  Patient ID: Todd RainwaterChristopher M Carroll, male   DOB: [birth year], 43 y.o.   MRN: [id number]  HPI  Todd OhmChris is a 43yo M with hiv disease, cd 4 count of 1070/VL,20 (jan 2020) currently on biktarvy. Since we last saw him, also has been seen at GI, taking omeprazole in the am and pepcid at night. Was treated for hpylori by pcp, based on ab studies. Not by bx or breath/stool test.  ROS: hematuria occ, left leg itchy skin lesion, of late has had sore throat but no fever, chills, denies dysphagia. Otherwise 12 point ros is negative Outpatient Encounter Medications as of 12/31/2018  Medication Sig  . BIKTARVY 50-200-25 MG TABS tablet TAKE 1 TABLET BY MOUTH DAILY  . famotidine (PEPCID) 40 MG tablet Take 1 tablet (40 mg total) by mouth at bedtime. (Patient not taking: Reported on 10/22/2018)  . hydrOXYzine (ATARAX/VISTARIL) 10 MG tablet TAKE 1 TABLET(10 MG) BY MOUTH THREE TIMES DAILY AS NEEDED FOR ITCHING  . hydrOXYzine (ATARAX/VISTARIL) 25 MG tablet Take 1 tablet (25 mg total) by mouth 3 (three) times daily as needed for itching.  . meloxicam (MOBIC) 15 MG tablet Take 1 tablet (15 mg total) by mouth daily.  . metroNIDAZOLE (FLAGYL) 250 MG tablet Take 1 tablet (250 mg total) by mouth 4 (four) times daily.  Marland Kitchen. omeprazole (PRILOSEC) 20 MG capsule Take 2 tablets ( total 40 mg) every morning before eating.  Marland Kitchen. tetracycline (ACHROMYCIN,SUMYCIN) 500 MG capsule Take 1 capsule (500 mg total) by mouth 4 (four) times daily.  . traMADol (ULTRAM) 50 MG tablet Take 1 tablet (50 mg total) by mouth 2 (two) times daily. (Patient not taking: Reported on 11/07/2018)  . valACYclovir (VALTREX) 500 MG tablet TAKE 1 TABLET BY MOUTH DAILY.   No facility-administered encounter medications on file as of 12/31/2018.      Patient Active Problem List   Diagnosis Date Noted  . Abdominal pain, epigastric 10/23/2018  . Dysphagia 10/23/2018  . Constipation 10/23/2018  . Bilateral arm pain 09/12/2018  .  Bursitis of elbow 11/01/2014  . 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 Scalp pruritus 07/25/2014  . Itch 05/25/2014  . Rash and nonspecific skin eruption 01/05/2014  . Folliculitis 07/13/2013  . Pain, dental 07/13/2013  . Dysuria 04/29/2013  . Human immunodeficiency virus (HIV) disease (HCC) 02/27/2013  . GERD 05/04/2008  . ALLERGIC RHINITIS 08/27/2007   Social History   Tobacco Use  . Smoking status: Never Smoker  . Smokeless tobacco: Never Used  Substance Use Topics  . Alcohol use: Yes    Comment: social , liquor-- 1 drink every month to 2 months  . Drug use: No  - has had oral sex  There are no preventive care reminders to display for this patient.    Physical Exam   BP 104/77   Pulse 69   Temp 98.6 F (37 C) (Oral)   Ht 6\' 1"  (1.854 m)   Wt 249 lb 8 oz (113.2 kg)   BMI 32.92 kg/m   Physical Exam  Constitutional: He is oriented to person, place, and time. He appears well-developed and well-nourished. No distress.  HENT:  Mouth/Throat: Oropharynx is clear and moist. No oropharyngeal exudate.  Cardiovascular: Normal rate, regular rhythm and normal heart sounds. Exam reveals no gallop and no friction rub.  No murmur heard.  Pulmonary/Chest: Effort normal and breath sounds normal. No respiratory distress. He has no wheezes.  Abdominal: Soft. Bowel sounds are normal. He exhibits no distension. There is  no tenderness.  Lymphadenopathy:  He has no cervical adenopathy.  Neurological: He is alert and oriented to person, place, and time.  Skin: Skin is warm and dry. Dry macule on legs, appears to be flaky Psychiatric: He has a normal mood and affect. His behavior is normal.    Lab Results  Component Value Date   CD4TCELL 39 12/19/2018   Lab Results  Component Value Date   CD4TABS 1,070 12/19/2018   CD4TABS 1,090 04/29/2018   CD4TABS 1,140 07/02/2017   Lab Results  Component Value Date   HIV1RNAQUANT <20 NOT DETECTED 12/19/2018   Lab Results  Component Value Date    HEPBSAB NONREACTIVE 03/19/2013   Lab Results  Component Value Date   LABRPR NON-REACTIVE 12/19/2018    CBC Lab Results  Component Value Date   WBC 6.1 12/19/2018   RBC 4.72 12/19/2018   HGB 14.4 12/19/2018   HCT 41.9 12/19/2018   PLT 391 12/19/2018   MCV 88.8 12/19/2018   MCH 30.5 12/19/2018   MCHC 34.4 12/19/2018   RDW 13.2 12/19/2018   LYMPHSABS 2,672 12/19/2018   MONOABS 553 07/02/2017   EOSABS 220 12/19/2018    BMET Lab Results  Component Value Date   NA 138 12/19/2018   K 4.3 12/19/2018   CL 102 12/19/2018   CO2 31 12/19/2018   GLUCOSE 100 (H) 12/19/2018   BUN 10 12/19/2018   CREATININE 1.00 12/19/2018   CALCIUM 9.3 12/19/2018   GFRNONAA 92 12/19/2018   GFRAA 107 12/19/2018      Assessment and Plan  hiv = continue on biktarvy, well controlled Health promotion = will check lipid profile and will give pneumovax today Hyperglycemia = will check hgba1c since DM runs in his family Dermatitis = will do a trial of cream Sore throat = will check gc/chlam of throat, hx of oral sex Hematuria = will check ua to see if detectable GERD= will give refills on ppi

## 2019-01-01 LAB — LIPID PANEL
CHOL/HDL RATIO: 4.3 (calc) (ref <5.0)
Cholesterol: 190 mg/dL (ref <200)
HDL: 44 mg/dL (ref >40)
LDL Cholesterol (Calc): 120 mg/dL (calc) — ABNORMAL HIGH
NON-HDL CHOLESTEROL (CALC): 146 mg/dL — AB (ref <130)
Triglycerides: 144 mg/dL (ref <150)

## 2019-01-01 LAB — HEMOGLOBIN A1C
Hgb A1c MFr Bld: 6.3 % of total Hgb — ABNORMAL HIGH (ref <5.7)
Mean Plasma Glucose: 134 (calc)
eAG (mmol/L): 7.4 (calc)

## 2019-01-01 LAB — CYTOLOGY, (ORAL, ANAL, URETHRAL) ANCILLARY ONLY
Chlamydia: NEGATIVE
Neisseria Gonorrhea: POSITIVE — AB

## 2019-01-02 ENCOUNTER — Telehealth: Payer: Self-pay | Admitting: Behavioral Health

## 2019-01-02 NOTE — Telephone Encounter (Addendum)
Patient's oral swab is positive for Gonorrhea.  Would you like to bring patient in for treatment with Azithromycin 1000mg  PO and Rocephin 250mg  IM once?   Angeline Slim RN

## 2019-01-05 NOTE — Telephone Encounter (Signed)
Attempted to call patient to give him results of positive Gonorrhea plus patient has an allergy to Azithromycin documented in the chart.  Called patient to see the severity of the allergy and to see what happened in the past.  Patient did not answer and was unable to leave a message because there was no voicemail available. Angeline Slim RN

## 2019-01-06 NOTE — Telephone Encounter (Signed)
Attempted to call patient to schedule Gonorrhea treatment and to further investigate azithromycin allergy. Unable to leave a voicemail. Honestii Marton RN  

## 2019-01-07 ENCOUNTER — Encounter: Payer: Self-pay | Admitting: Family

## 2019-01-07 NOTE — Telephone Encounter (Signed)
Attempted to call patient to schedule Gonorrhea treatment and to further investigate azithromycin allergy. Unable to leave a voicemail. Angeline Slim RN

## 2019-01-13 ENCOUNTER — Telehealth: Payer: Self-pay

## 2019-01-13 ENCOUNTER — Ambulatory Visit (INDEPENDENT_AMBULATORY_CARE_PROVIDER_SITE_OTHER): Payer: Self-pay

## 2019-01-13 DIAGNOSIS — A64 Unspecified sexually transmitted disease: Secondary | ICD-10-CM

## 2019-01-13 MED ORDER — CEFTRIAXONE SODIUM 500 MG IJ SOLR
500.0000 mg | Freq: Once | INTRAMUSCULAR | Status: AC
  Administered 2019-01-13: 500 mg via INTRAMUSCULAR

## 2019-01-13 MED ORDER — CEFTRIAXONE SODIUM 250 MG IJ SOLR: 250.0000 mg | Freq: Once | INTRAMUSCULAR | Status: DC

## 2019-01-13 MED ORDER — DOXYCYCLINE HYCLATE 100 MG PO TABS: 100.0000 mg | ORAL_TABLET | Freq: Two times a day (BID) | ORAL | 0 refills | Status: AC

## 2019-01-13 MED FILL — DOXYCYCLINE HYCLATE 100 MG: 100 | 7 days supply | Qty: 14 | Fill #0

## 2019-01-13 MED FILL — OMEPRAZOLE 20 MG CAP: 20 | 30 days supply | Qty: 60 | Fill #3

## 2019-01-13 NOTE — Progress Notes (Signed)
Patient arrived for treatment of Oral gonorrhea with ceftriaxone 250mg  IM.  Patient identity and allergies verified. Patient has allergy to azithromycin therefore oral doxycycline sent to [facility]. Verbal orders per Rexene Alberts, NP. Patient offered condoms/refused. Patient reminded to avoid sexual encounters for the next 14 days and always use condoms. Patient understood advise. No adverse reactions noted.  Valarie Cones, LPN

## 2019-01-13 NOTE — Telephone Encounter (Addendum)
Patient called to inquire about recent lab result. Patient made aware of +  For Gonorrhea. Patient allerg Verbal orders per Rexene Alberts, NP to treat with Ceftriaxone 250mg  IM x 1 dose and oral doxycycline 100mg  twice daily x 7 days advise patient to take medication with food and to avoid any and all sexual contact for at least 14 days and to contact recent Patient scheduled for nurse visit today.

## 2019-01-19 MED FILL — SF 5000 PLUS CREAM: 1.1 | 15 days supply | Qty: 51 | Fill #0

## 2019-01-22 ENCOUNTER — Telehealth: Payer: Self-pay

## 2019-01-22 MED FILL — FAMOTIDINE 40 MG TABS: 40 | 30 days supply | Qty: 30 | Fill #2

## 2019-01-22 NOTE — Telephone Encounter (Signed)
Patient walked into clinic today complaining of sore throat. Patient would like to know if his sore throat could be related to his recent infection with Gonorrhea. Informed patient that it takes antibiotics at least two weeks to get in full effect, and that he should try over the counter medication for his throat. Patient took last dose of Doxycycline on 2/18. Todd Carroll, [place]

## 2019-02-04 ENCOUNTER — Other Ambulatory Visit: Payer: Self-pay | Admitting: Internal Medicine

## 2019-02-04 MED ORDER — BICTEGRAVIR-EMTRICITAB-TENOFOV 50-200-25 MG PO TABS: 1.0000 | ORAL_TABLET | Freq: Every day | ORAL | 3 refills | Status: DC

## 2019-02-04 NOTE — Addendum Note (Signed)
Addended by: Valarie Cones on: 02/04/2019 10:21 AM   Modules accepted: Orders

## 2019-02-23 ENCOUNTER — Other Ambulatory Visit: Payer: Self-pay | Admitting: *Deleted

## 2019-02-23 DIAGNOSIS — A6001 Herpesviral infection of penis: Secondary | ICD-10-CM

## 2019-02-23 DIAGNOSIS — K219 Gastro-esophageal reflux disease without esophagitis: Secondary | ICD-10-CM

## 2019-02-23 MED ORDER — OMEPRAZOLE 20 MG PO CPDR: DELAYED_RELEASE_CAPSULE | ORAL | 5 refills | Status: DC

## 2019-02-23 MED ORDER — VALACYCLOVIR HCL 500 MG PO TABS: 500.0000 mg | ORAL_TABLET | Freq: Every day | ORAL | 4 refills | Status: DC

## 2019-02-24 ENCOUNTER — Other Ambulatory Visit: Payer: Self-pay | Admitting: Family Medicine

## 2019-02-24 DIAGNOSIS — K219 Gastro-esophageal reflux disease without esophagitis: Secondary | ICD-10-CM

## 2019-02-24 MED FILL — hydrOXYzine HCL 25 MG TABS: 25 | 30 days supply | Qty: 90 | Fill #3

## 2019-02-24 MED FILL — OMEPRAZOLE 20 MG CAP: 20 | 30 days supply | Qty: 60 | Fill #0

## 2019-02-24 MED FILL — VALACYCLOVIR HCL 500 MG TAB: 500 | 30 days supply | Qty: 30 | Fill #2

## 2019-04-03 ENCOUNTER — Ambulatory Visit: Payer: Self-pay | Admitting: Family Medicine

## 2019-04-09 NOTE — Telephone Encounter (Signed)
Message sent to provider 

## 2019-04-16 MED FILL — ?OMEPRAZOLE 20 MG CAPSULE D: 20 | 30 days supply | Qty: 60 | Fill #1

## 2019-04-16 MED FILL — ?VALACYCLOVIR HCL 500MG TAB: 500 | 30 days supply | Qty: 30 | Fill #3

## 2019-04-16 MED FILL — hydrOXYzine HCL 25 MG TABS: 25 | 30 days supply | Qty: 90 | Fill #4

## 2019-04-16 MED FILL — SF 5000 PLUS CREAM: 1.1 | 15 days supply | Qty: 51 | Fill #1

## 2019-05-26 MED FILL — ?VALACYCLOVIR HCL 500MG TAB: 500 | 30 days supply | Qty: 30 | Fill #4

## 2019-05-26 MED FILL — hydrOXYzine HCL 25 MG TABS: 25 | 30 days supply | Qty: 90 | Fill #5

## 2019-05-26 MED FILL — SF 5000 PLUS CREAM: 1.1 | 15 days supply | Qty: 51 | Fill #2

## 2019-05-26 MED FILL — ?OMEPRAZOLE 20 MG CAPSULE D: 20 | 30 days supply | Qty: 60 | Fill #2

## 2019-05-27 ENCOUNTER — Ambulatory Visit (INDEPENDENT_AMBULATORY_CARE_PROVIDER_SITE_OTHER): Payer: Self-pay | Admitting: Family Medicine

## 2019-05-27 ENCOUNTER — Other Ambulatory Visit: Payer: Self-pay

## 2019-05-27 ENCOUNTER — Encounter: Payer: Self-pay | Admitting: Family Medicine

## 2019-05-27 VITALS — BP 117/73 | HR 99 | Temp 98.9°F | Resp 16 | Ht 73.0 in | Wt 264.0 lb

## 2019-05-27 DIAGNOSIS — L739 Follicular disorder, unspecified: Secondary | ICD-10-CM

## 2019-05-27 DIAGNOSIS — R300 Dysuria: Secondary | ICD-10-CM

## 2019-05-27 DIAGNOSIS — J029 Acute pharyngitis, unspecified: Secondary | ICD-10-CM

## 2019-05-27 DIAGNOSIS — L299 Pruritus, unspecified: Secondary | ICD-10-CM

## 2019-05-27 LAB — POCT URINALYSIS DIPSTICK
Bilirubin, UA: NEGATIVE
Blood, UA: NEGATIVE
Glucose, UA: NEGATIVE
Ketones, UA: NEGATIVE
Leukocytes, UA: NEGATIVE
Nitrite, UA: NEGATIVE
Protein, UA: NEGATIVE
Spec Grav, UA: 1.025 (ref 1.010–1.025)
Urobilinogen, UA: 0.2 E.U./dL
pH, UA: 5.5 (ref 5.0–8.0)

## 2019-05-27 LAB — POCT RAPID STREP A (OFFICE): Rapid Strep A Screen: NEGATIVE

## 2019-05-27 MED ORDER — TRIAMCINOLONE ACETONIDE 0.025 % EX OINT: 1.0000 "application " | TOPICAL_OINTMENT | Freq: Two times a day (BID) | CUTANEOUS | 0 refills | Status: DC

## 2019-05-27 MED ORDER — CLINDAMYCIN PHOSPHATE 1 % EX GEL: Freq: Two times a day (BID) | CUTANEOUS | 0 refills | Status: DC

## 2019-05-27 MED FILL — CLINDAMYCIN PH 1% GEL: 1 | 15 days supply | Qty: 30 | Fill #0

## 2019-05-27 MED FILL — TRIAMCINOLONE 0.025% OINT: 0.025 | 15 days supply | Qty: 30 | Fill #0

## 2019-05-27 NOTE — Progress Notes (Signed)
Patient [facility]   Progress Note: General Provider: Lanae Boast, FNP  SUBJECTIVE:   Todd Carroll is a 43 y.o. male who  has a past medical history of ALLERGIC RHINITIS (08/27/2007), Allergy, and GERD (05/04/2008).. Patient presents today for Cough, Rash (rash on face and arms that itches ), Dysuria, and Sore Throat   Patient presents with complaints of Burning with urination, sore throat and rash. He is followed by ID for HIV and has a hx of oral gonorrhea. He was last treated for this in February 2020.  Rash This is a recurrent problem. The current episode started more than 1 month ago. The problem has been waxing and waning since onset. The rash is characterized by itchiness. He was exposed to nothing. Associated symptoms include coughing and a sore throat. Past treatments include nothing. His past medical history is significant for allergies.  Dysuria  This is a recurrent problem.  Sore Throat  Associated symptoms include coughing.  History of GERD that is not well controlled due to medication compliance.    Review of Systems  Constitutional: Negative.   HENT: Positive for sore throat.   Respiratory: Positive for cough.   Genitourinary: Positive for dysuria.  Skin: Positive for rash.     OBJECTIVE: BP 117/73 (BP Location: Right Arm, Patient Position: Sitting, Cuff Size: Large)   Pulse 99   Temp 98.9 F (37.2 C) (Oral)   Resp 16   Ht 6\' 1"  (1.854 m)   Wt 264 lb (119.7 kg)   SpO2 98%   BMI 34.83 kg/m   Wt Readings from Last 3 Encounters:  05/27/19 264 lb (119.7 kg)  12/31/18 249 lb 8 oz (113.2 kg)  11/07/18 252 lb (114.3 kg)     Physical Exam Vitals signs and nursing note reviewed.  Constitutional:      General: He is not in acute distress.    Appearance: Normal appearance. He is well-developed.  HENT:     Head: Normocephalic and atraumatic.     Right Ear: Tympanic membrane and ear canal normal.     Left Ear:  Tympanic membrane and ear canal normal.     Mouth/Throat:     Mouth: Mucous membranes are moist.     Tongue: No lesions.     Pharynx: Oropharynx is clear. No oropharyngeal exudate, posterior oropharyngeal erythema or uvula swelling.     Tonsils: No tonsillar exudate or tonsillar abscesses. 2+ on the right. 2+ on the left.  Eyes:     Extraocular Movements: Extraocular movements intact.     Conjunctiva/sclera: Conjunctivae normal.     Pupils: Pupils are equal, round, and reactive to light.  Neck:     Musculoskeletal: Normal range of motion and neck supple.     Thyroid: No thyromegaly.  Cardiovascular:     Rate and Rhythm: Normal rate and regular rhythm.     Heart sounds: Normal heart sounds. No murmur.  Pulmonary:     Effort: Pulmonary effort is normal.     Breath sounds: Normal breath sounds.  Musculoskeletal: Normal range of motion.  Lymphadenopathy:     Cervical: No cervical adenopathy.  Skin:    General: Skin is warm and dry.  Neurological:     Mental Status: He is alert and oriented to person, place, and time.  Psychiatric:        Mood and Affect: Mood normal.        Behavior: Behavior normal.  Thought Content: Thought content normal.        Judgment: Judgment normal.     ASSESSMENT/PLAN:  1. Dysuria ChL/GC labs ordered.  - Urinalysis Dipstick  2. Sore throat - Rapid Strep A - Gonococcus culture - Chlamydia culture - Culture, Group A Strep  3. Folliculitis Small pustules noted in the hair line and facial hair. Consistent with folliculitis.  - clindamycin (CLINDAGEL) 1 % gel; Apply topically 2 (two) times daily.  Dispense: 30 g; Refill: 0  4. Itch No rash noted to the upper and lower extremities. Erythema noted due to scratching.  - triamcinolone (KENALOG) 0.025 % ointment; Apply 1 application topically 2 (two) times daily.  Dispense: 30 g; Refill: 0    Return if symptoms worsen or fail to improve.    The patient was given clear instructions to go to  ER or return to medical center if symptoms do not improve, worsen or new problems develop. The patient verbalized understanding and agreed with plan of care.   Ms. Freda Jacksonndr L. Riley Lamouglas, FNP-BC [facility] [address]  [place], [place] [address] [phone]

## 2019-05-27 NOTE — Patient Instructions (Signed)
Pruritus Pruritus is an itchy feeling on the skin. One of the most common causes is dry skin, but many different things can cause itching. Most cases of itching do not require medical attention. Sometimes itchy skin can turn into a rash. Follow these instructions at home: Skin care   Apply moisturizing lotion to your skin as needed. Lotion that contains petroleum jelly is best.  Take medicines or apply medicated creams only as told by your health care provider. This may include: ? Corticosteroid cream. ? Anti-itch lotions. ? Oral antihistamines.  Apply a cool, wet cloth (cool compress) to the affected areas.  Take baths with one of the following: ? Epsom salts. You can get these at your local pharmacy or grocery store. Follow the instructions on the packaging. ? Baking soda. Pour a small amount into the bath as told by your health care provider. ? Colloidal oatmeal. You can get this at your local pharmacy or grocery store. Follow the instructions on the packaging.  Apply baking soda paste to your skin. To make the paste, stir water into a small amount of baking soda until it reaches a paste-like consistency.  Do not scratch your skin.  Do not take hot showers or baths, which can make itching worse. A cool shower may help with itching as long as you apply moisturizing lotion after the shower.  Do not use scented soaps, detergents, perfumes, and cosmetic products. Instead, use gentle, unscented versions of these items. General instructions  Avoid wearing tight clothes.  Keep a journal to help find out what is causing your itching. Write down: ? What you eat and drink. ? What cosmetic products you use. ? What soaps or detergents you use. ? What you wear, including jewelry.  Use a humidifier. This keeps the air moist, which helps to prevent dry skin.  Be aware of any changes in your itchiness. Contact a health care provider if:  The itching does not go away after several days.   You are unusually thirsty or urinating more than normal.  Your skin tingles or feels numb.  Your skin or the white parts of your eyes turn yellow (jaundice).  You feel weak.  You have any of the following: ? Night sweats. ? Tiredness (fatigue). ? Weight loss. ? Abdominal pain. Summary  Pruritus is an itchy feeling on the skin. One of the most common causes is dry skin, but many different conditions and factors can cause itching.  Apply moisturizing lotion to your skin as needed. Lotion that contains petroleum jelly is best.  Take medicines or apply medicated creams only as told by your health care provider.  Do not take hot showers or baths. Do not use scented soaps, detergents, perfumes, or cosmetic products. This information is not intended to replace advice given to you by your health care provider. Make sure you discuss any questions you have with your health care provider. Document Released: 08/01/2011 Document Revised: 12/03/2017 Document Reviewed: 12/03/2017 Elsevier Interactive Patient Education  2019 Elsevier Inc. Contact Dermatitis Dermatitis is redness, soreness, and swelling (inflammation) of the skin. Contact dermatitis is a reaction to something that touches the skin. There are two types of contact dermatitis:  Irritant contact dermatitis. This happens when something bothers (irritates) your skin, like soap.  Allergic contact dermatitis. This is caused when you are exposed to something that you are allergic to, such as poison ivy. What are the causes?  Common causes of irritant contact dermatitis include: ? Makeup. ? Soaps. ? Detergents. ?  Bleaches. ? Acids. ? Metals, such as nickel.  Common causes of allergic contact dermatitis include: ? Plants. ? Chemicals. ? Jewelry. ? Latex. ? Medicines. ? Preservatives in products, such as clothing. What increases the risk?  Having a job that exposes you to things that bother your skin.  Having asthma or  eczema. What are the signs or symptoms? Symptoms may happen anywhere the irritant has touched your skin. Symptoms include:  Dry or flaky skin.  Redness.  Cracks.  Itching.  Pain or a burning feeling.  Blisters.  Blood or clear fluid draining from skin cracks. With allergic contact dermatitis, swelling may occur. This may happen in places such as the eyelids, mouth, or genitals. How is this treated?  This condition is treated by checking for the cause of the reaction and protecting your skin. Treatment may also include: ? Steroid creams, ointments, or medicines. ? Antibiotic medicines or other ointments, if you have a skin infection. ? Lotion or medicines to help with itching. ? A bandage (dressing). Follow these instructions at home: Skin care  Moisturize your skin as needed.  Put cool cloths on your skin.  Put a baking soda paste on your skin. Stir water into baking soda until it looks like a paste.  Do not scratch your skin.  Avoid having things rub up against your skin.  Avoid the use of soaps, perfumes, and dyes. Medicines  Take or apply over-the-counter and prescription medicines only as told by your doctor.  If you were prescribed an antibiotic medicine, take or apply it as told by your doctor. Do not stop using it even if your condition starts to get better. Bathing  Take a bath with: ? Epsom salts. ? Baking soda. ? Colloidal oatmeal.  Bathe less often.  Bathe in warm water. Avoid using hot water. Bandage care  If you were given a bandage, change it as told by your health care provider.  Wash your hands with soap and water before and after you change your bandage. If soap and water are not available, use hand sanitizer. General instructions  Avoid the things that caused your reaction. If you do not know what caused it, keep a journal. Write down: ? What you eat. ? What skin products you use. ? What you drink. ? What you wear in the area that has  symptoms. This includes jewelry.  Check the affected areas every day for signs of infection. Check for: ? More redness, swelling, or pain. ? More fluid or blood. ? Warmth. ? Pus or a bad smell.  Keep all follow-up visits as told by your doctor. This is important. Contact a doctor if:  You do not get better with treatment.  Your condition gets worse.  You have signs of infection, such as: ? More swelling. ? Tenderness. ? More redness. ? Soreness. ? Warmth.  You have a fever.  You have new symptoms. Get help right away if:  You have a very bad headache.  You have neck pain.  Your neck is stiff.  You throw up (vomit).  You feel very sleepy.  You see red streaks coming from the area.  Your bone or joint near the area hurts after the skin has healed.  The area turns darker.  You have trouble breathing. Summary  Dermatitis is redness, soreness, and swelling of the skin.  Symptoms may occur where the irritant has touched you.  Treatment may include medicines and skin care.  If you do not know what  caused your reaction, keep a journal.  Contact a doctor if your condition gets worse or you have signs of infection. This information is not intended to replace advice given to you by your health care provider. Make sure you discuss any questions you have with your health care provider. Document Released: 09/16/2009 Document Revised: 06/04/2018 Document Reviewed: 06/04/2018 Elsevier Interactive Patient Education  2019 [employer].

## 2019-05-31 LAB — CHLAMYDIA CULTURE: Chlamydia Culture: NEGATIVE

## 2019-05-31 LAB — CULTURE, GROUP A STREP

## 2019-06-01 LAB — GONOCOCCUS CULTURE

## 2019-06-01 MED ORDER — PENICILLIN V POTASSIUM 500 MG PO TABS: 500.0000 mg | ORAL_TABLET | Freq: Three times a day (TID) | ORAL | 0 refills | Status: AC

## 2019-06-01 MED FILL — PENICILLIN VK 500 MG TABLET: 500 | 10 days supply | Qty: 30 | Fill #0

## 2019-06-01 NOTE — Progress Notes (Signed)
Please let patient know that he has beta hemolytic strep. His chlamydia and gonorrhea tests are negative. I will send penicillin to the pharmacy for this. He will need to change his toothbrush 24 hours after starting the antibiotic.

## 2019-06-01 NOTE — Addendum Note (Signed)
Addended by: Genelle Bal on: 06/01/2019 10:02 AM   Modules accepted: Orders

## 2019-06-02 ENCOUNTER — Telehealth: Payer: Self-pay

## 2019-06-02 NOTE — Telephone Encounter (Signed)
-----   Message from Lanae Boast, [place] sent at 06/01/2019 10:02 AM EDT ----- Please let patient know that he has beta hemolytic strep. His chlamydia and gonorrhea tests are negative. I will send penicillin to the pharmacy for this. He will need to change his toothbrush 24 hours after starting the antibiotic.

## 2019-06-02 NOTE — Telephone Encounter (Signed)
Called, no answer and no voicemail. Will call later.

## 2019-07-06 ENCOUNTER — Encounter: Payer: Self-pay | Admitting: Family Medicine

## 2019-07-06 ENCOUNTER — Other Ambulatory Visit: Payer: Self-pay

## 2019-07-06 ENCOUNTER — Ambulatory Visit (INDEPENDENT_AMBULATORY_CARE_PROVIDER_SITE_OTHER): Payer: Self-pay | Admitting: Family Medicine

## 2019-07-06 VITALS — BP 129/75 | HR 87 | Temp 99.2°F | Resp 14 | Ht 73.0 in | Wt 261.0 lb

## 2019-07-06 DIAGNOSIS — L299 Pruritus, unspecified: Secondary | ICD-10-CM

## 2019-07-06 DIAGNOSIS — J029 Acute pharyngitis, unspecified: Secondary | ICD-10-CM

## 2019-07-06 DIAGNOSIS — L739 Follicular disorder, unspecified: Secondary | ICD-10-CM

## 2019-07-06 DIAGNOSIS — Z1159 Encounter for screening for other viral diseases: Secondary | ICD-10-CM

## 2019-07-06 DIAGNOSIS — J020 Streptococcal pharyngitis: Secondary | ICD-10-CM

## 2019-07-06 LAB — POCT RAPID STREP A (OFFICE): Rapid Strep A Screen: NEGATIVE

## 2019-07-06 MED ORDER — CLINDAMYCIN PHOSPHATE 1 % EX GEL: Freq: Two times a day (BID) | CUTANEOUS | 0 refills | Status: DC

## 2019-07-06 MED ORDER — TRIAMCINOLONE ACETONIDE 0.025 % EX OINT: 1.0000 "application " | TOPICAL_OINTMENT | Freq: Two times a day (BID) | CUTANEOUS | 0 refills | Status: DC

## 2019-07-06 MED FILL — CLINDAMYCIN PH 1% GEL: 1 | 15 days supply | Qty: 30 | Fill #0

## 2019-07-06 MED FILL — VALACYCLOVIR HCL 500 MG TAB: 500 | 60 days supply | Qty: 60 | Fill #0

## 2019-07-06 MED FILL — TRIAMCINOLONE 0.025% OINT: 0.025 | 15 days supply | Qty: 30 | Fill #0

## 2019-07-06 MED FILL — hydrOXYzine HCL 25 MG TABS: 25 | 30 days supply | Qty: 90 | Fill #6

## 2019-07-06 NOTE — Patient Instructions (Signed)
Pharyngitis  Pharyngitis is a sore throat (pharynx). This is when there is redness, pain, and swelling in your throat. Most of the time, this condition gets better on its own. In some cases, you may need medicine. Follow these instructions at home:  Take over-the-counter and prescription medicines only as told by your doctor. ? If you were prescribed an antibiotic medicine, take it as told by your doctor. Do not stop taking the antibiotic even if you start to feel better. ? Do not give children aspirin. Aspirin has been linked to Reye syndrome.  Drink enough water and fluids to keep your pee (urine) clear or pale yellow.  Get a lot of rest.  Rinse your mouth (gargle) with a salt-water mixture 3-4 times a day or as needed. To make a salt-water mixture, completely dissolve -1 tsp of salt in 1 cup of warm water.  If your doctor approves, you may use throat lozenges or sprays to soothe your throat. Contact a doctor if:  You have large, tender lumps in your neck.  You have a rash.  You cough up green, yellow-brown, or bloody spit. Get help right away if:  You have a stiff neck.  You drool or cannot swallow liquids.  You cannot drink or take medicines without throwing up.  You have very bad pain that does not go away with medicine.  You have problems breathing, and it is not from a stuffy nose.  You have new pain and swelling in your knees, ankles, wrists, or elbows. Summary  Pharyngitis is a sore throat (pharynx). This is when there is redness, pain, and swelling in your throat.  If you were prescribed an antibiotic medicine, take it as told by your doctor. Do not stop taking the antibiotic even if you start to feel better.  Most of the time, pharyngitis gets better on its own. Sometimes, you may need medicine. This information is not intended to replace advice given to you by your health care provider. Make sure you discuss any questions you have with your health care  provider. Document Released: 05/07/2008 Document Revised: 11/01/2017 Document Reviewed: 12/25/2016 Elsevier Patient Education  2020 Elsevier Inc.   

## 2019-07-06 NOTE — Progress Notes (Signed)
Patient [facility]   Progress Note: Sick Visit Provider: Lanae Boast, FNP  SUBJECTIVE:   Todd Carroll is a 43 y.o. male who  has a past medical history of ALLERGIC RHINITIS (08/27/2007), Allergy, and GERD (05/04/2008).. Patient presents today for Sore Throat  Patient seen on 05/27/2019 for sore throat. He was tested for chl/gc and strep. Found to have beta hemolytic strep and treated with penicillin. He states that he continue to have a sore throat for the past 3 weeks.  Patient also requesting testing for hepatitis. He is followed by ID for HIV.  Review of Systems  HENT: Positive for sore throat.   All other systems reviewed and are negative.    OBJECTIVE: BP 129/75 (BP Location: Right Arm, Patient Position: Sitting, Cuff Size: Large)   Pulse 87   Temp 99.2 F (37.3 C) (Oral)   Resp 14   Ht 6\' 1"  (1.854 m)   Wt 261 lb (118.4 kg)   SpO2 97%   BMI 34.43 kg/m   Wt Readings from Last 3 Encounters:  07/06/19 261 lb (118.4 kg)  05/27/19 264 lb (119.7 kg)  12/31/18 249 lb 8 oz (113.2 kg)     Physical Exam Vitals signs and nursing note reviewed.  Constitutional:      General: He is not in acute distress.    Appearance: Normal appearance.  HENT:     Head: Normocephalic and atraumatic.     Mouth/Throat:     Pharynx: No oropharyngeal exudate or posterior oropharyngeal erythema.     Tonsils: 2+ on the right. 2+ on the left.  Eyes:     Extraocular Movements: Extraocular movements intact.     Conjunctiva/sclera: Conjunctivae normal.     Pupils: Pupils are equal, round, and reactive to light.  Cardiovascular:     Rate and Rhythm: Normal rate and regular rhythm.     Heart sounds: No murmur.  Pulmonary:     Effort: Pulmonary effort is normal.     Breath sounds: Normal breath sounds.  Musculoskeletal: Normal range of motion.  Skin:    General: Skin is warm and dry.  Neurological:     Mental Status: He is alert and oriented to  person, place, and time.  Psychiatric:        Mood and Affect: Mood normal.        Behavior: Behavior normal.        Thought Content: Thought content normal.        Judgment: Judgment normal.     ASSESSMENT/PLAN:   1. Sore throat Referred to ENT for further evaluation and treatment.  - Mononucleosis screen - Chlamydia/GC NAA, Confirmation  2. Folliculitis Med refilled - clindamycin (CLINDAGEL) 1 % gel; Apply topically 2 (two) times daily.  Dispense: 30 g; Refill: 0  3. Itch Med refilled - triamcinolone (KENALOG) 0.025 % ointment; Apply 1 application topically 2 (two) times daily.  Dispense: 30 g; Refill: 0  4. Pharyngitis due to Streptococcus species Throat culture ordered.  - Ambulatory referral to ENT - Culture, Group A Strep - Chlamydia/GC NAA, Confirmation - Rapid Strep A  5. Need for hepatitis C screening test Screening today.  - Hepatitis panel, acute         The patient was given clear instructions to go to ER or return to medical center if symptoms do not improve, worsen or new problems develop. The patient verbalized understanding and agreed with plan of care.   Ms. Carney Bern  Monica Becton, FNP-BC [facility] [address] [place], [place] [address] [phone]     This note has been created with Dragon speech recognition software and smart phrase technology. Any transcriptional errors are unintentional.

## 2019-07-07 LAB — HEPATITIS PANEL, ACUTE
Hep A IgM: NEGATIVE
Hep B C IgM: NEGATIVE
Hep C Virus Ab: <0.1 s/co ratio (ref 0.0–0.9)
Hepatitis B Surface Ag: NEGATIVE

## 2019-07-07 LAB — MONONUCLEOSIS SCREEN: Mono Screen: NEGATIVE

## 2019-07-09 ENCOUNTER — Telehealth: Payer: Self-pay

## 2019-07-09 LAB — CULTURE, GROUP A STREP

## 2019-07-09 NOTE — Telephone Encounter (Signed)
-----   Message from Lanae Boast, [place] sent at 07/08/2019  8:07 AM EDT ----- Please let patient know that his CH/GC and throat culture are pending. He is negative for hepatitis and mononucleosis.

## 2019-07-09 NOTE — Telephone Encounter (Signed)
Called and spoke with patient advised that labs were normal and negative. Patient verbalized understanding. Thanks!

## 2019-07-09 NOTE — Progress Notes (Signed)
Please let patient know that his culture came back positive for strep again. Inquire if he took all antibiotics and changed toothbrush. He has a referral for ENT

## 2019-07-12 LAB — CT/GC NAA, PHARYNGEAL
C TRACH RRNA NPH QL PCR: NEGATIVE
N GONORRHOEA RRNA NPH QL PCR: NEGATIVE

## 2019-07-16 ENCOUNTER — Other Ambulatory Visit: Payer: Self-pay | Admitting: Internal Medicine

## 2019-07-20 ENCOUNTER — Other Ambulatory Visit: Payer: Self-pay

## 2019-07-20 ENCOUNTER — Ambulatory Visit: Payer: Self-pay

## 2019-07-22 ENCOUNTER — Ambulatory Visit: Payer: Self-pay

## 2019-07-22 ENCOUNTER — Other Ambulatory Visit: Payer: Self-pay | Admitting: Internal Medicine

## 2019-07-22 ENCOUNTER — Encounter: Payer: Self-pay | Admitting: Infectious Diseases

## 2019-07-22 ENCOUNTER — Other Ambulatory Visit: Payer: Self-pay | Admitting: *Deleted

## 2019-07-22 ENCOUNTER — Other Ambulatory Visit: Payer: Self-pay

## 2019-07-22 ENCOUNTER — Ambulatory Visit (INDEPENDENT_AMBULATORY_CARE_PROVIDER_SITE_OTHER): Payer: Self-pay | Admitting: Infectious Diseases

## 2019-07-22 DIAGNOSIS — R599 Enlarged lymph nodes, unspecified: Secondary | ICD-10-CM

## 2019-07-22 DIAGNOSIS — H60503 Unspecified acute noninfective otitis externa, bilateral: Secondary | ICD-10-CM

## 2019-07-22 DIAGNOSIS — Z598 Other problems related to housing and economic circumstances: Secondary | ICD-10-CM

## 2019-07-22 DIAGNOSIS — Z5989 Other problems related to housing and economic circumstances: Secondary | ICD-10-CM

## 2019-07-22 DIAGNOSIS — Z22338 Carrier of other streptococcus: Secondary | ICD-10-CM

## 2019-07-22 DIAGNOSIS — B20 Human immunodeficiency virus [HIV] disease: Secondary | ICD-10-CM

## 2019-07-22 NOTE — Progress Notes (Signed)
Patient: Todd Carroll  DOB: [birth year] MRN: [id number] PCP: Lanae Boast, [place]    Patient Active Problem List   Diagnosis Date Noted  . 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 Underinsured 07/24/2019  . Otitis externa, acute noninfectious 07/24/2019  . Abdominal pain, epigastric 10/23/2018  . Dysphagia 10/23/2018  . Constipation 10/23/2018  . 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 Scalp pruritus 07/25/2014  . Itch 05/25/2014  . Rash and nonspecific skin eruption 01/05/2014  . Folliculitis 77/41/2878  . Pain, dental 07/13/2013  . Dysuria 04/29/2013  . Human immunodeficiency virus (HIV) disease ([place]) 02/27/2013  . GERD 05/04/2008  . ALLERGIC RHINITIS 08/27/2007     Subjective:  Todd Carroll is a 43 y.o. patient with well controlled HIV on Biktarvy here today for walk in visit to evaluate a swollen left lymph node in the throat.   He describes a lymph node in the left mandible. Describes this to be waxing and waning in size.  He previously had sore throat with swallowing and was treated 3 weeks ago for strep throat with a course of antibiotics, however has felt overall unchanged. He completed his antibiotic as prescribed. He has no fevers, chills, fatigue, headaches, GI symptoms. No cough, SOB. He is eating and sleeping normally. No dental complaints or pain. Oropharyngeal swab was recently negative for gonorrhea/chlamydia. Monoscreen negative.  He has had 2 negative rapid strep screens with cultures that have grown Group A Strep twice now.   He does have allergy symptoms (itchy ears, nasal congestion periodically) and not taking allergy medications currently.   He lost the number for an ENT and is requesting payment assistance for the visit.   Review of Systems  Constitutional: Negative for chills, diaphoresis, fever and malaise/fatigue.  HENT: Positive for congestion (intermittently ). Negative for ear pain, hearing loss and sinus pain. Sore throat:  reports sore on the outside neck.        Itchy ear canals   Eyes: Negative for pain and discharge.  Respiratory: Negative for shortness of breath.   Cardiovascular: Negative for chest pain.  Gastrointestinal: Negative for abdominal pain.  Skin: Negative for rash.    Past Medical History:  Diagnosis Date  . ALLERGIC RHINITIS 08/27/2007  . Allergy   . GERD 05/04/2008    Outpatient Medications Prior to Visit  Medication Sig Dispense Refill  . BIKTARVY 50-200-25 MG TABS tablet TAKE 1 TABLET BY MOUTH DAILY 30 tablet 3  . clindamycin (CLINDAGEL) 1 % gel Apply topically 2 (two) times daily. 30 g 0  . hydrOXYzine (VISTARIL) 25 MG capsule Take 25 mg by mouth 3 (three) times daily as needed for itching.    . multivitamin (ONE-A-DAY MEN'S) TABS tablet Take 1 tablet by mouth daily. 100 tablet 0  . omeprazole (PRILOSEC) 20 MG capsule Take 2 tablets ( total 40 mg) every morning before eating. 60 capsule 5  . triamcinolone (KENALOG) 0.025 % ointment Apply 1 application topically 2 (two) times daily. 30 g 0  . valACYclovir (VALTREX) 500 MG tablet Take 1 tablet (500 mg total) by mouth daily. 60 tablet 4   No facility-administered medications prior to visit.      Allergies  Allergen Reactions  . Azithromycin Itching    Social History   Tobacco Use  . Smoking status: Never Smoker  . Smokeless tobacco: Never Used  Substance Use Topics  . Alcohol use: Yes    Comment: social , liquor-- 1 drink every month to 2 months  .  Drug use: No    Family History  Problem Relation Age of Onset  . Multiple myeloma Mother   . Diabetes Mother   . Colon cancer Neg Hx   . Esophageal cancer Neg Hx   . Stomach cancer Neg Hx   . Pancreatic cancer Neg Hx   . Liver disease Neg Hx   . Rectal cancer Neg Hx     Objective:   Vitals:   07/22/19 1440  BP: 124/83  Pulse: 94  Temp: 98.9 F (37.2 C)  TempSrc: Oral  SpO2: 99%   There is no height or weight on file to calculate BMI.  Physical Exam Vitals  signs reviewed.  Constitutional:      Appearance: He is well-developed.  HENT:     Right Ear: Tympanic membrane normal. No drainage. No middle ear effusion.     Left Ear: No drainage.  No middle ear effusion.     Ears:     Comments: Ear canals seem sharply angled. Slight areas of erythema/scaling into ear canal.     Mouth/Throat:     Mouth: Mucous membranes are moist.     Pharynx: Oropharynx is clear. No pharyngeal swelling, oropharyngeal exudate or posterior oropharyngeal erythema.  Eyes:     Conjunctiva/sclera: Conjunctivae normal.  Cardiovascular:     Rate and Rhythm: Normal rate.  Pulmonary:     Effort: Pulmonary effort is normal.     Breath sounds: Normal breath sounds.  Lymphadenopathy:     Comments: No significantly palpable tonsillar lymph nodes. No palpable occipital, mandibular or cervical nodes.   Skin:    General: Skin is warm and dry.     Findings: No rash.  Neurological:     Mental Status: He is alert.     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 On exam there is no significant swelling I can appreciate today. No other occipital or cervical/axillary/clavicular nodes are palpable. He has no symptoms concerning for ongoing/untreated pharyngitis on exam or report. Seems that the sore throat is more allergy mediated. Suggested to stay well hydrated,  monitor for change in symptoms and take antihistamine daily with steroid nasal spray.   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 For now he will go to ENT appointment (as he has other ear related concerns/symptoms also) as previously directed by other provider and follow up with Dr. Baxter Flattery in a few weeks.       Underinsured    Provided resources to [employer] today. Verified that referring ENT provider is to be included for this. Provided contact information as well.  Otitis externa, acute noninfectious    ?allergic mediated vs eczematous. No odor or foul smell. ENT consultation to follow.       RESOLVED: Swollen lymph nodes      Janene Madeira, MSN, NP-C [facility] [place] Pager: [phone] Office: [phone]  07/24/19  12:22 PM

## 2019-07-22 NOTE — Patient Instructions (Addendum)
Would continue taking your allergy medication.  Can add flonase nasal spray to help if you have ear fullness or nasal congestion.   Worrisome things for the lymph node that would warrant an ultrasound: 1. Increased pain  2. Large size (larger than a quarter)  3. Does not go down overtime.    Please follow up with Dr. Baxter Flattery again as scheduled.   We gave you information for the [facility] has sent this to Dr. Benjamine Mola (Ear Nose and Throat). Here is the number so you can call to schedule.  Address: [address] [place], [place], [place] [address] Phone: [phone]

## 2019-07-23 ENCOUNTER — Encounter: Payer: Self-pay | Admitting: Internal Medicine

## 2019-07-23 LAB — T-HELPER CELL (CD4) - (RCID CLINIC ONLY)
CD4 % Helper T Cell: 43 % (ref 33–65)
CD4 T Cell Abs: 942 /uL (ref 400–1790)

## 2019-07-24 DIAGNOSIS — Z598 Other problems related to housing and economic circumstances: Secondary | ICD-10-CM | POA: Insufficient documentation

## 2019-07-24 DIAGNOSIS — Z5971 Insufficient health insurance coverage: Secondary | ICD-10-CM | POA: Insufficient documentation

## 2019-07-24 DIAGNOSIS — Z5989 Other problems related to housing and economic circumstances: Secondary | ICD-10-CM | POA: Insufficient documentation

## 2019-07-24 DIAGNOSIS — H60509 Unspecified acute noninfective otitis externa, unspecified ear: Secondary | ICD-10-CM | POA: Insufficient documentation

## 2019-07-24 DIAGNOSIS — R599 Enlarged lymph nodes, unspecified: Secondary | ICD-10-CM | POA: Insufficient documentation

## 2019-07-24 DIAGNOSIS — Z22338 Carrier of other streptococcus: Secondary | ICD-10-CM | POA: Insufficient documentation

## 2019-07-24 NOTE — Assessment & Plan Note (Signed)
?  allergic mediated vs eczematous. No odor or foul smell. ENT consultation to follow.

## 2019-07-24 NOTE — Assessment & Plan Note (Addendum)
He perceives a swollen lymph node in left neck under his mandible. On exam there is no significant swelling I can appreciate today. No other occipital or cervical/axillary/clavicular nodes are palpable. He has no symptoms concerning for ongoing/untreated pharyngitis on exam or report. Seems that the sore throat is more allergy mediated. Suggested to stay well hydrated, monitor for change in symptoms and take antihistamine daily with steroid nasal spray.   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 For now he will go to ENT appointment (as he has other ear related concerns/symptoms also) as previously directed by other provider and follow up with Dr. Baxter Flattery in a few weeks.

## 2019-07-24 NOTE — Assessment & Plan Note (Signed)
Provided resources to [facility] Application today. Verified that referring ENT provider is to be included for this. Provided contact information as well.

## 2019-07-25 LAB — CBC WITH DIFFERENTIAL/PLATELET
Absolute Monocytes: 621 cells/uL (ref 200–950)
Basophils Absolute: 48 cells/uL (ref 0–200)
Basophils Relative: 0.7 %
Eosinophils Absolute: 297 cells/uL (ref 15–500)
Eosinophils Relative: 4.3 %
HCT: 39.5 % (ref 38.5–50.0)
Hemoglobin: 13.6 g/dL (ref 13.2–17.1)
Lymphs Abs: 2208 cells/uL (ref 850–3900)
MCH: 30.4 pg (ref 27.0–33.0)
MCHC: 34.4 g/dL (ref 32.0–36.0)
MCV: 88.2 fL (ref 80.0–100.0)
MPV: 9.7 fL (ref 7.5–12.5)
Monocytes Relative: 9 %
Neutro Abs: 3726 cells/uL (ref 1500–7800)
Neutrophils Relative %: 54 %
Platelets: 386 10*3/uL (ref 140–400)
RBC: 4.48 10*6/uL (ref 4.20–5.80)
RDW: 13.5 % (ref 11.0–15.0)
Total Lymphocyte: 32 %
WBC: 6.9 10*3/uL (ref 3.8–10.8)

## 2019-07-25 LAB — COMPLETE METABOLIC PANEL WITH GFR
AG Ratio: 1.4 (calc) (ref 1.0–2.5)
ALT: 20 U/L (ref 9–46)
AST: 19 U/L (ref 10–40)
Albumin: 4.2 g/dL (ref 3.6–5.1)
Alkaline phosphatase (APISO): 89 U/L (ref 36–130)
BUN: 14 mg/dL (ref 7–25)
CO2: 22 mmol/L (ref 20–32)
Calcium: 9.1 mg/dL (ref 8.6–10.3)
Chloride: 106 mmol/L (ref 98–110)
Creat: 1.15 mg/dL (ref 0.60–1.35)
GFR, Est African American: 90 mL/min/{1.73_m2} (ref >=60)
GFR, Est Non African American: 78 mL/min/{1.73_m2} (ref >=60)
Globulin: 3 g/dL (calc) (ref 1.9–3.7)
Glucose, Bld: 141 mg/dL — ABNORMAL HIGH (ref 65–99)
Potassium: 4.2 mmol/L (ref 3.5–5.3)
Sodium: 139 mmol/L (ref 135–146)
Total Bilirubin: 0.3 mg/dL (ref 0.2–1.2)
Total Protein: 7.2 g/dL (ref 6.1–8.1)

## 2019-07-25 LAB — HIV-1 RNA QUANT-NO REFLEX-BLD
HIV 1 RNA Quant: <20 copies/mL
HIV-1 RNA Quant, Log: <1.3 Log copies/mL

## 2019-08-04 ENCOUNTER — Encounter: Payer: Self-pay | Admitting: Internal Medicine

## 2019-08-05 ENCOUNTER — Ambulatory Visit: Payer: Self-pay | Admitting: Internal Medicine

## 2019-08-12 ENCOUNTER — Encounter (HOSPITAL_COMMUNITY): Payer: Self-pay

## 2019-08-12 ENCOUNTER — Encounter (HOSPITAL_COMMUNITY): Payer: Self-pay | Admitting: *Deleted

## 2019-08-17 ENCOUNTER — Other Ambulatory Visit: Payer: Self-pay | Admitting: Internal Medicine

## 2019-08-17 DIAGNOSIS — K219 Gastro-esophageal reflux disease without esophagitis: Secondary | ICD-10-CM

## 2019-08-18 ENCOUNTER — Other Ambulatory Visit: Payer: Self-pay | Admitting: Family Medicine

## 2019-08-18 DIAGNOSIS — L299 Pruritus, unspecified: Secondary | ICD-10-CM

## 2019-08-18 DIAGNOSIS — L739 Follicular disorder, unspecified: Secondary | ICD-10-CM

## 2019-08-18 MED FILL — ?OMEPRAZOLE 20MG CAP DR: 20 | 30 days supply | Qty: 60 | Fill #3

## 2019-08-18 MED FILL — hydrOXYzine HCL 25 MG TABS: 25 | 30 days supply | Qty: 90 | Fill #0

## 2019-08-18 MED FILL — CLINDAMYCIN PH 1% GEL: 1 | 15 days supply | Qty: 30 | Fill #0

## 2019-08-18 MED FILL — SF 5000 PLUS CREAM: 1.1 | 15 days supply | Qty: 51 | Fill #3

## 2019-08-18 MED FILL — TRIAMCINOLONE 0.025% OINT: 0.025 | 15 days supply | Qty: 30 | Fill #0

## 2019-08-24 ENCOUNTER — Other Ambulatory Visit: Payer: Self-pay

## 2019-08-24 ENCOUNTER — Encounter: Payer: Self-pay | Admitting: Internal Medicine

## 2019-08-24 ENCOUNTER — Ambulatory Visit (INDEPENDENT_AMBULATORY_CARE_PROVIDER_SITE_OTHER): Payer: Self-pay | Admitting: Internal Medicine

## 2019-08-24 VITALS — BP 124/72 | HR 82 | Temp 98.0°F

## 2019-08-24 DIAGNOSIS — Z79899 Other long term (current) drug therapy: Secondary | ICD-10-CM

## 2019-08-24 DIAGNOSIS — B20 Human immunodeficiency virus [HIV] disease: Secondary | ICD-10-CM

## 2019-08-24 DIAGNOSIS — K219 Gastro-esophageal reflux disease without esophagitis: Secondary | ICD-10-CM

## 2019-08-24 DIAGNOSIS — A6001 Herpesviral infection of penis: Secondary | ICD-10-CM

## 2019-08-24 MED ORDER — BIKTARVY 50-200-25 MG PO TABS: 1.0000 | ORAL_TABLET | Freq: Every day | ORAL | 5 refills | Status: DC

## 2019-08-24 MED ORDER — OMEPRAZOLE 20 MG PO CPDR: DELAYED_RELEASE_CAPSULE | ORAL | 5 refills | Status: DC

## 2019-08-24 MED ORDER — VALACYCLOVIR HCL 500 MG PO TABS: 500.0000 mg | ORAL_TABLET | Freq: Every day | ORAL | 4 refills | Status: DC

## 2019-08-24 MED ORDER — HYDROXYZINE PAMOATE 25 MG PO CAPS: 25.0000 mg | ORAL_CAPSULE | Freq: Three times a day (TID) | ORAL | 2 refills | Status: DC | PRN

## 2019-08-24 NOTE — Progress Notes (Signed)
RFV: follow up for hiv disease  Patient ID: Todd Carroll, male   DOB: 1976/10/31, 43 y.o.   MRN: [id number]  HPI Todd Carroll is  43yo M with hiv disease, 942/VL<20, biktarvy who reports doing well with hiv medications. Only missed 1-2 doses since we last saw him. He is overall keeping to himself. Wearing masks most of the time in public.   Outpatient Encounter Medications as of 08/24/2019  Medication Sig  . clindamycin (CLINDAGEL) 1 % gel APPLY TOPICALLY 2 (TWO) TIMES DAILY.  . hydrOXYzine (ATARAX/VISTARIL) 25 MG tablet TAKE 1 TABLET (25 MG TOTAL) BY MOUTH 3 (THREE) TIMES DAILY AS NEEDED FOR ITCHING.  . multivitamin (ONE-A-DAY MEN'S) TABS tablet Take 1 tablet by mouth daily.  Marland Kitchen. triamcinolone (KENALOG) 0.025 % ointment APPLY 1 APPLICATION TOPICALLY 2 (TWO) TIMES DAILY.  . valACYclovir (VALTREX) 500 MG tablet Take 1 tablet (500 mg total) by mouth daily.  . [DISCONTINUED] BIKTARVY 50-200-25 MG TABS tablet TAKE 1 TABLET BY MOUTH DAILY  . [DISCONTINUED] omeprazole (PRILOSEC) 20 MG capsule TAKE 2 CAPSULES BY MOUTH EVERY MORNING BEFORE EATING  . [DISCONTINUED] valACYclovir (VALTREX) 500 MG tablet Take 1 tablet (500 mg total) by mouth daily.  . [DISCONTINUED] hydrOXYzine (VISTARIL) 25 MG capsule Take 25 mg by mouth 3 (three) times daily as needed for itching.   No facility-administered encounter medications on file as of 08/24/2019.      Patient Active Problem List   Diagnosis Date Noted  . 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 Underinsured 07/24/2019  . Otitis externa, acute noninfectious 07/24/2019  . Abdominal pain, epigastric 10/23/2018  . Dysphagia 10/23/2018  . Constipation 10/23/2018  . 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 Scalp pruritus 07/25/2014  . Itch 05/25/2014  . Rash and nonspecific skin eruption 01/05/2014  . Folliculitis 07/13/2013  . Pain, dental 07/13/2013  . Dysuria 04/29/2013  . Human immunodeficiency virus (HIV) disease (HCC) 02/27/2013  . GERD  05/04/2008  . ALLERGIC RHINITIS 08/27/2007     There are no preventive care reminders to display for this patient.   Review of Systems Review of Systems  Constitutional: Negative for fever, chills, diaphoresis, activity change, appetite change, fatigue and unexpected weight change.  HENT: Negative for congestion, sore throat, rhinorrhea, sneezing, trouble swallowing and sinus pressure.  Eyes: Negative for photophobia and visual disturbance.  Respiratory: Negative for cough, chest tightness, shortness of breath, wheezing and stridor.  Cardiovascular: Negative for chest pain, palpitations and leg swelling.  Gastrointestinal: Negative for nausea, vomiting, abdominal pain, diarrhea, constipation, blood in stool, abdominal distention and anal bleeding.  Genitourinary: Negative for dysuria, hematuria, flank pain and difficulty urinating.  Musculoskeletal: Negative for myalgias, back pain, joint swelling, arthralgias and gait problem.  Skin: Negative for color change, pallor, rash and wound.  Neurological: Negative for dizziness, tremors, weakness and light-headedness.  Hematological: Negative for adenopathy. Does not bruise/bleed easily.  Psychiatric/Behavioral: Negative for behavioral problems, confusion, sleep disturbance, dysphoric mood, decreased concentration and agitation.     Physical Exam   BP 124/72   Pulse 82   Temp 98 F (36.7 C)  Physical Exam  Constitutional: He is oriented to person, place, and time. He appears well-developed and well-nourished. No distress.  HENT:  Mouth/Throat: Oropharynx is clear and moist. No oropharyngeal exudate. Good dentition Cardiovascular: Normal rate, regular rhythm and normal heart sounds. Exam reveals no gallop and no friction rub.  No murmur heard.  Pulmonary/Chest: Effort normal and breath sounds normal. No respiratory distress. He has no wheezes.  Lymphadenopathy:  He has no cervical adenopathy.  Neurological: He is alert and oriented to  person, place, and time.  Skin: Skin is warm and dry. No rash noted. No erythema.  Psychiatric: He has a normal mood and affect. His behavior is normal.     Lab Results  Component Value Date   CD4TCELL 43 07/22/2019   Lab Results  Component Value Date   CD4TABS 942 07/22/2019   CD4TABS 1,070 12/19/2018   CD4TABS 1,090 04/29/2018   Lab Results  Component Value Date   HIV1RNAQUANT <20 NOT DETECTED 07/22/2019   Lab Results  Component Value Date   HEPBSAB NONREACTIVE 03/19/2013   Lab Results  Component Value Date   LABRPR NON-REACTIVE 12/19/2018    CBC Lab Results  Component Value Date   WBC 6.9 07/22/2019   RBC 4.48 07/22/2019   HGB 13.6 07/22/2019   HCT 39.5 07/22/2019   PLT 386 07/22/2019   MCV 88.2 07/22/2019   MCH 30.4 07/22/2019   MCHC 34.4 07/22/2019   RDW 13.5 07/22/2019   LYMPHSABS 2,208 07/22/2019   MONOABS 553 07/02/2017   EOSABS 297 07/22/2019    BMET Lab Results  Component Value Date   NA 139 07/22/2019   K 4.2 07/22/2019   CL 106 07/22/2019   CO2 22 07/22/2019   GLUCOSE 141 (H) 07/22/2019   BUN 14 07/22/2019   CREATININE 1.15 07/22/2019   CALCIUM 9.1 07/22/2019   GFRNONAA 78 07/22/2019   GFRAA 90 07/22/2019      Assessment and Plan   hiv disease = well controlled; plan to continue with biktarvy  Risk of covid = discussed the importance of 3 W  Long term medication management = cr appears stable, will check labs and ua at next visit  Health maintenance = recommend to get flu shot, deferred at this visit

## 2019-08-25 ENCOUNTER — Other Ambulatory Visit: Payer: Self-pay

## 2019-09-02 MED FILL — SF 5000 PLUS CREAM: 1.1 | 15 days supply | Qty: 51 | Fill #4

## 2019-09-02 MED FILL — VALACYCLOVIR HCL 500 MG TAB: 500 | 60 days supply | Qty: 60 | Fill #1

## 2019-10-15 ENCOUNTER — Other Ambulatory Visit: Payer: Self-pay | Admitting: Family Medicine

## 2019-10-15 DIAGNOSIS — L739 Follicular disorder, unspecified: Secondary | ICD-10-CM

## 2019-10-15 DIAGNOSIS — L299 Pruritus, unspecified: Secondary | ICD-10-CM

## 2019-10-15 MED FILL — ?OMEPRAZOLE 20MG CAP DR: 20 | 30 days supply | Qty: 60 | Fill #4

## 2019-10-15 MED FILL — TRIAMCINOLONE 0.025% OINT: 0.025 | 15 days supply | Qty: 30 | Fill #0

## 2019-10-15 MED FILL — CLINDAMYCIN PH 1% GEL: 1 | 15 days supply | Qty: 30 | Fill #0

## 2019-10-15 MED FILL — ?VALACYCLOVIR HCL 500MG TAB: 500 | 60 days supply | Qty: 60 | Fill #2

## 2019-10-15 MED FILL — hydrOXYzine HCL 25 MG TABS: 25 | 30 days supply | Qty: 90 | Fill #1

## 2019-10-15 MED FILL — SF 5000 PLUS CREAM: 1.1 | 15 days supply | Qty: 51 | Fill #5

## 2019-11-28 ENCOUNTER — Other Ambulatory Visit: Payer: Self-pay | Admitting: Internal Medicine

## 2019-12-10 ENCOUNTER — Other Ambulatory Visit: Payer: Self-pay | Admitting: Internal Medicine

## 2019-12-10 DIAGNOSIS — L739 Follicular disorder, unspecified: Secondary | ICD-10-CM

## 2019-12-10 DIAGNOSIS — L299 Pruritus, unspecified: Secondary | ICD-10-CM

## 2019-12-10 MED FILL — hydrOXYzine HCL 25 MG TABS: 25 | 30 days supply | Qty: 90 | Fill #2

## 2019-12-10 MED FILL — VALACYCLOVIR HCL 500 MG TAB: 500 | 90 days supply | Qty: 90 | Fill #3

## 2019-12-10 MED FILL — ?OMEPRAZOLE 20MG CAP DR: 20 | 30 days supply | Qty: 60 | Fill #0

## 2019-12-11 ENCOUNTER — Other Ambulatory Visit: Payer: Self-pay

## 2019-12-11 NOTE — Telephone Encounter (Signed)
Please fill for previous provider if appropriate 

## 2019-12-14 MED FILL — CLINDAMYCIN PH 1% GEL: 1 | 15 days supply | Qty: 30 | Fill #0

## 2019-12-14 MED FILL — SF 5000 PLUS CREAM: 1.1 | 30 days supply | Qty: 51 | Fill #0

## 2019-12-14 MED FILL — TRIAMCINOLONE 0.025% OINT: 0.025 | 15 days supply | Qty: 30 | Fill #0

## 2019-12-15 ENCOUNTER — Telehealth: Payer: Self-pay

## 2019-12-15 NOTE — Telephone Encounter (Signed)
Patient requesting Hgb A1C to be drawn on next lab visit. Last drawn 01/01/19 at [facility] with a value of 6.3. Routing to Dr. Drue Second for order if she approves will make patient aware. Valarie Cones

## 2019-12-23 ENCOUNTER — Other Ambulatory Visit: Payer: Self-pay

## 2019-12-23 ENCOUNTER — Other Ambulatory Visit: Payer: Self-pay | Admitting: *Deleted

## 2019-12-23 DIAGNOSIS — Z113 Encounter for screening for infections with a predominantly sexual mode of transmission: Secondary | ICD-10-CM

## 2019-12-23 DIAGNOSIS — Z79899 Other long term (current) drug therapy: Secondary | ICD-10-CM

## 2019-12-23 DIAGNOSIS — B20 Human immunodeficiency virus [HIV] disease: Secondary | ICD-10-CM

## 2019-12-23 NOTE — Progress Notes (Signed)
Patient came int o clinic and asked for an A1C to be drawn, but not any other HIV-related labs. RN will ask him to follow up with his PCP for this. Andree Coss, RN

## 2019-12-28 ENCOUNTER — Other Ambulatory Visit: Payer: Self-pay

## 2019-12-28 ENCOUNTER — Ambulatory Visit (INDEPENDENT_AMBULATORY_CARE_PROVIDER_SITE_OTHER): Payer: Self-pay | Admitting: Nurse Practitioner

## 2019-12-28 ENCOUNTER — Encounter: Payer: Self-pay | Admitting: Internal Medicine

## 2019-12-28 ENCOUNTER — Encounter: Payer: Self-pay | Admitting: Nurse Practitioner

## 2019-12-28 ENCOUNTER — Ambulatory Visit: Payer: Self-pay

## 2019-12-28 VITALS — BP 118/89 | HR 105 | Temp 98.9°F | Resp 16 | Wt 263.0 lb

## 2019-12-28 DIAGNOSIS — Z833 Family history of diabetes mellitus: Secondary | ICD-10-CM

## 2019-12-28 DIAGNOSIS — J029 Acute pharyngitis, unspecified: Secondary | ICD-10-CM

## 2019-12-28 DIAGNOSIS — Z113 Encounter for screening for infections with a predominantly sexual mode of transmission: Secondary | ICD-10-CM

## 2019-12-28 DIAGNOSIS — L739 Follicular disorder, unspecified: Secondary | ICD-10-CM

## 2019-12-28 DIAGNOSIS — L299 Pruritus, unspecified: Secondary | ICD-10-CM

## 2019-12-28 DIAGNOSIS — Z79899 Other long term (current) drug therapy: Secondary | ICD-10-CM

## 2019-12-28 DIAGNOSIS — B20 Human immunodeficiency virus [HIV] disease: Secondary | ICD-10-CM

## 2019-12-28 LAB — POCT GLYCOSYLATED HEMOGLOBIN (HGB A1C): Hemoglobin A1C: 6.1 % — AB (ref 4.0–5.6)

## 2019-12-28 MED ORDER — HYDROXYZINE PAMOATE 25 MG PO CAPS: 25.0000 mg | ORAL_CAPSULE | Freq: Three times a day (TID) | ORAL | 2 refills | Status: DC

## 2019-12-28 MED ORDER — CLINDAMYCIN PHOSPHATE 1 % EX GEL: Freq: Two times a day (BID) | CUTANEOUS | 0 refills | Status: DC

## 2019-12-28 MED ORDER — TRIAMCINOLONE ACETONIDE 0.025 % EX OINT: 1.0000 "application " | TOPICAL_OINTMENT | Freq: Two times a day (BID) | CUTANEOUS | 0 refills | Status: DC

## 2019-12-28 NOTE — Patient Instructions (Addendum)
  Already has information on Prediabetes

## 2019-12-28 NOTE — Progress Notes (Signed)
Established Patient Office Visit  Subjective:  Patient ID: Todd Carroll, male    DOB: Oct 17, 1976  Age: 44 y.o. MRN: [id number]  CC:  Chief Complaint  Patient presents with  . Follow-up    asking for labs (hgba1c and lipid)   . Sore Throat    wants to know if he has strep   . Medication Refill    [facility]     HPI Todd Carroll presents for follow up.  He has a history of HIV and is followed by infectious disease.  He is treated here for allergic rhinitis and GERD.  He admits that he has been having a sore throat for approximately 1 week.  He is worse when he wakes up in the morning.  He is not sure if he snores or is a mouth breather.  He did heat with gas.  He denies any difficulty swallowing.  He does have an occasional cough. He denies any fever, chills, shortness of breath or chest pain.  He would like to have his A1c checked today secondary to his family history of diabetes.  He denies any symptoms admits that he does try to be careful when he eats.   Past Medical History:  Diagnosis Date  . ALLERGIC RHINITIS 08/27/2007  . Allergy   . GERD 05/04/2008    Past Surgical History:  Procedure Laterality Date  . APPENDECTOMY      Family History  Problem Relation Age of Onset  . Multiple myeloma Mother   . Diabetes Mother   . Colon cancer Neg Hx   . Esophageal cancer Neg Hx   . Stomach cancer Neg Hx   . Pancreatic cancer Neg Hx   . Liver disease Neg Hx   . Rectal cancer Neg Hx     Social History   Socioeconomic History  . Marital status: Single    Spouse name: Not on file  . Number of children: Not on file  . Years of education: Not on file  . Highest education level: Not on file  Occupational History  . Not on file  Tobacco Use  . Smoking status: Never Smoker  . Smokeless tobacco: Never Used  Substance and Sexual Activity  . Alcohol use: Yes    Comment: social , liquor-- 1 drink every month to 2 months  . Drug  use: No  . Sexual activity: Not Currently    Partners: Female, Male    Birth control/protection: Condom    Comment: pt declined condoms  Other Topics Concern  . Not on file  Social History Narrative  . Not on file   Social Determinants of Health   Financial Resource Strain:   . Difficulty of Paying Living Expenses: Not on file  Food Insecurity:   . Worried About Charity fundraiser in the Last Year: Not on file  . Ran Out of Food in the Last Year: Not on file  Transportation Needs:   . Lack of Transportation (Medical): Not on file  . Lack of Transportation (Non-Medical): Not on file  Physical Activity:   . Days of Exercise per Week: Not on file  . Minutes of Exercise per Session: Not on file  Stress:   . Feeling of Stress : Not on file  Social Connections:   . Frequency of Communication with Friends and Family: Not on file  . Frequency of Social Gatherings with Friends and Family: Not on file  . Attends Religious  Services: Not on file  . Active Member of Clubs or Organizations: Not on file  . Attends Archivist Meetings: Not on file  . Marital Status: Not on file  Intimate Partner Violence:   . Fear of Current or Ex-Partner: Not on file  . Emotionally Abused: Not on file  . Physically Abused: Not on file  . Sexually Abused: Not on file    Outpatient Medications Prior to Visit  Medication Sig Dispense Refill  . bictegravir-emtricitabine-tenofovir AF (BIKTARVY) 50-200-25 MG TABS tablet Take 1 tablet by mouth daily. 30 tablet 5  . clindamycin (CLINDAGEL) 1 % gel APPLY TOPICALLY 2 (TWO) TIMES DAILY. 30 g 0  . hydrOXYzine (ATARAX/VISTARIL) 25 MG tablet TAKE 1 TABLET (25 MG TOTAL) BY MOUTH 3 (THREE) TIMES DAILY AS NEEDED FOR ITCHING. 90 tablet 6  . multivitamin (ONE-A-DAY MEN'S) TABS tablet Take 1 tablet by mouth daily. 100 tablet 0  . omeprazole (PRILOSEC) 20 MG capsule TAKE 2 CAPSULES BY MOUTH EVERY MORNING BEFORE EATING 60 capsule 5  . triamcinolone (KENALOG)  0.025 % ointment APPLY 1 APPLICATION TOPICALLY 2 (TWO) TIMES DAILY. 30 g 0  . valACYclovir (VALTREX) 500 MG tablet Take 1 tablet (500 mg total) by mouth daily. 60 tablet 4  . hydrOXYzine (VISTARIL) 25 MG capsule Take 1 capsule (25 mg total) by mouth 3 (three) times daily as needed for itching. (Patient not taking: Reported on 12/28/2019) 30 capsule 2   No facility-administered medications prior to visit.    Allergies  Allergen Reactions  . Azithromycin Itching    ROS Review of Systems  Constitutional: Negative.   HENT: Positive for sore throat.   Eyes: Negative.   Respiratory: Positive for cough.   Cardiovascular: Negative.   Gastrointestinal: Negative.   Endocrine: Negative.   Genitourinary: Negative.   Musculoskeletal: Negative.   Skin: Negative.   Allergic/Immunologic: Negative.   Neurological: Negative.   Hematological: Negative.   Psychiatric/Behavioral: Negative.       Objective:    Physical Exam  Constitutional: He is oriented to person, place, and time. He appears well-developed and well-nourished.  HENT:  Head: Normocephalic.  Cardiovascular: Normal rate and regular rhythm. Exam reveals no gallop.  Pulmonary/Chest: Effort normal and breath sounds normal.  Abdominal: Soft. Bowel sounds are normal.  Musculoskeletal:        General: Normal range of motion.     Cervical back: Normal range of motion and neck supple.  Neurological: He is alert and oriented to person, place, and time.  Skin: Skin is warm and dry.  Psychiatric: He has a normal mood and affect. Judgment and thought content normal.    BP 118/89 (BP Location: Left Arm, Patient Position: Sitting, Cuff Size: Normal)   Pulse (!) 105   Temp 98.9 F (37.2 C) (Oral)   Resp 16   Wt 263 lb (119.3 kg)   SpO2 98%   BMI 34.70 kg/m  Wt Readings from Last 3 Encounters:  12/28/19 263 lb (119.3 kg)  07/06/19 261 lb (118.4 kg)  05/27/19 264 lb (119.7 kg)     There are no preventive care reminders to  display for this patient.  There are no preventive care reminders to display for this patient.  Lab Results  Component Value Date   TSH 0.352 (L) 10/10/2018   Lab Results  Component Value Date   WBC 6.9 07/22/2019   HGB 13.6 07/22/2019   HCT 39.5 07/22/2019   MCV 88.2 07/22/2019   PLT 386 07/22/2019   Lab  Results  Component Value Date   NA 139 07/22/2019   K 4.2 07/22/2019   CO2 22 07/22/2019   GLUCOSE 141 (H) 07/22/2019   BUN 14 07/22/2019   CREATININE 1.15 07/22/2019   BILITOT 0.3 07/22/2019   ALKPHOS 86 10/10/2018   AST 19 07/22/2019   ALT 20 07/22/2019   PROT 7.2 07/22/2019   ALBUMIN 4.3 10/10/2018   CALCIUM 9.1 07/22/2019   Lab Results  Component Value Date   CHOL 190 12/31/2018   Lab Results  Component Value Date   HDL 44 12/31/2018   Lab Results  Component Value Date   LDLCALC 120 (H) 12/31/2018   Lab Results  Component Value Date   TRIG 144 12/31/2018   Lab Results  Component Value Date   CHOLHDL 4.3 12/31/2018   Lab Results  Component Value Date   HGBA1C 6.3 (H) 12/31/2018      Assessment & Plan:   Problem List Items Addressed This Visit    None    Visit Diagnoses    Family history of diabetes mellitus    -  Primary   Relevant Orders   HgB A1c   Sore throat       Relevant Orders   Strep A DNA Probe      No orders of the defined types were placed in this encounter.   Follow-up: No follow-ups on file.    Vevelyn Francois, NP

## 2019-12-29 LAB — T-HELPER CELL (CD4) - (RCID CLINIC ONLY)
CD4 % Helper T Cell: 45 % (ref 33–65)
CD4 T Cell Abs: 914 /uL (ref 400–1790)

## 2019-12-29 LAB — URINE CYTOLOGY ANCILLARY ONLY
Chlamydia: NEGATIVE
Comment: NEGATIVE
Comment: NORMAL
Neisseria Gonorrhea: NEGATIVE

## 2019-12-29 LAB — STREP A DNA PROBE: Strep Gp A Direct, DNA Probe: NEGATIVE

## 2019-12-31 LAB — CBC WITH DIFFERENTIAL/PLATELET
Absolute Monocytes: 590 cells/uL (ref 200–950)
Basophils Absolute: 22 cells/uL (ref 0–200)
Basophils Relative: 0.3 %
Eosinophils Absolute: 266 cells/uL (ref 15–500)
Eosinophils Relative: 3.7 %
HCT: 40.4 % (ref 38.5–50.0)
Hemoglobin: 13.9 g/dL (ref 13.2–17.1)
Lymphs Abs: 2966 cells/uL (ref 850–3900)
MCH: 30.5 pg (ref 27.0–33.0)
MCHC: 34.4 g/dL (ref 32.0–36.0)
MCV: 88.6 fL (ref 80.0–100.0)
MPV: 9.6 fL (ref 7.5–12.5)
Monocytes Relative: 8.2 %
Neutro Abs: 3355 cells/uL (ref 1500–7800)
Neutrophils Relative %: 46.6 %
Platelets: 436 10*3/uL — ABNORMAL HIGH (ref 140–400)
RBC: 4.56 10*6/uL (ref 4.20–5.80)
RDW: 13.3 % (ref 11.0–15.0)
Total Lymphocyte: 41.2 %
WBC: 7.2 10*3/uL (ref 3.8–10.8)

## 2019-12-31 LAB — LIPID PANEL
Cholesterol: 212 mg/dL — ABNORMAL HIGH (ref <200)
HDL: 48 mg/dL (ref >=40)
LDL Cholesterol (Calc): 138 mg/dL (calc) — ABNORMAL HIGH
Non-HDL Cholesterol (Calc): 164 mg/dL (calc) — ABNORMAL HIGH (ref <130)
Total CHOL/HDL Ratio: 4.4 (calc) (ref <5.0)
Triglycerides: 131 mg/dL (ref <150)

## 2019-12-31 LAB — COMPREHENSIVE METABOLIC PANEL
AG Ratio: 1.3 (calc) (ref 1.0–2.5)
ALT: 24 U/L (ref 9–46)
AST: 23 U/L (ref 10–40)
Albumin: 4.5 g/dL (ref 3.6–5.1)
Alkaline phosphatase (APISO): 100 U/L (ref 36–130)
BUN: 13 mg/dL (ref 7–25)
CO2: 25 mmol/L (ref 20–32)
Calcium: 9.3 mg/dL (ref 8.6–10.3)
Chloride: 103 mmol/L (ref 98–110)
Creat: 1.26 mg/dL (ref 0.60–1.35)
Globulin: 3.4 g/dL (calc) (ref 1.9–3.7)
Glucose, Bld: 109 mg/dL — ABNORMAL HIGH (ref 65–99)
Potassium: 4.5 mmol/L (ref 3.5–5.3)
Sodium: 136 mmol/L (ref 135–146)
Total Bilirubin: 0.3 mg/dL (ref 0.2–1.2)
Total Protein: 7.9 g/dL (ref 6.1–8.1)

## 2019-12-31 LAB — HIV-1 RNA QUANT-NO REFLEX-BLD
HIV 1 RNA Quant: <20 copies/mL
HIV-1 RNA Quant, Log: <1.3 Log copies/mL

## 2019-12-31 LAB — RPR: RPR Ser Ql: NONREACTIVE

## 2020-01-07 ENCOUNTER — Telehealth: Payer: Self-pay

## 2020-01-07 ENCOUNTER — Encounter: Payer: Self-pay | Admitting: Internal Medicine

## 2020-01-07 NOTE — Telephone Encounter (Signed)
Patient called office to review lab results from 1/25. Relayed results to patient, stated to patient that MD would review labs in more depth when he comes into office on 2/17. Patient refused signing up for mychart. Jose Darnelle Bos

## 2020-01-20 ENCOUNTER — Ambulatory Visit: Payer: Self-pay | Admitting: Internal Medicine

## 2020-02-15 ENCOUNTER — Ambulatory Visit: Payer: Self-pay | Admitting: Internal Medicine

## 2020-02-29 ENCOUNTER — Other Ambulatory Visit: Payer: Self-pay | Admitting: Internal Medicine

## 2020-02-29 ENCOUNTER — Telehealth: Payer: Self-pay

## 2020-02-29 DIAGNOSIS — K219 Gastro-esophageal reflux disease without esophagitis: Secondary | ICD-10-CM

## 2020-02-29 DIAGNOSIS — A6001 Herpesviral infection of penis: Secondary | ICD-10-CM

## 2020-02-29 MED FILL — OMEPRAZOLE 20 MG CAP: 20 | 30 days supply | Qty: 60 | Fill #0

## 2020-02-29 MED FILL — TRIAMCINOLONE 0.025% OINT: 0.025 | 15 days supply | Qty: 30 | Fill #0

## 2020-02-29 MED FILL — hydrOXYzine HCL 25 MG TABS: 25 | 30 days supply | Qty: 90 | Fill #3

## 2020-02-29 MED FILL — SF 5000 PLUS CREAM: 1.1 | 30 days supply | Qty: 51 | Fill #1

## 2020-02-29 NOTE — Telephone Encounter (Signed)
Called patient to schedule overdue appointment. Has multiple no shows with MD; will need to schedule appointment with pharmacy staff. Left voicemail requesting call back. Todd Carroll, [place]

## 2020-03-11 MED FILL — CLINDAMYCIN PHOSPHATE 1 % G: 1 | 15 days supply | Qty: 30 | Fill #0

## 2020-03-21 ENCOUNTER — Ambulatory Visit: Payer: Self-pay | Admitting: Internal Medicine

## 2020-03-28 ENCOUNTER — Ambulatory Visit: Payer: Self-pay | Admitting: Nurse Practitioner

## 2020-03-31 ENCOUNTER — Telehealth: Payer: Self-pay

## 2020-03-31 NOTE — Telephone Encounter (Signed)
Patient called to get an appointment scheduled with Dr. Drue Second, I informed the patient that he has missed multiple appointments with her and that our policy is after 3 missed appointments you have to see the pharmacist, he was argumentative and then hung up after stating he didn't need to see the pharmacist.

## 2020-04-15 ENCOUNTER — Ambulatory Visit (INDEPENDENT_AMBULATORY_CARE_PROVIDER_SITE_OTHER): Payer: Self-pay | Admitting: Nurse Practitioner

## 2020-04-15 ENCOUNTER — Encounter: Payer: Self-pay | Admitting: Nurse Practitioner

## 2020-04-15 ENCOUNTER — Other Ambulatory Visit: Payer: Self-pay

## 2020-04-15 VITALS — BP 117/88 | HR 92 | Temp 98.0°F | Ht 73.0 in | Wt 269.0 lb

## 2020-04-15 DIAGNOSIS — J029 Acute pharyngitis, unspecified: Secondary | ICD-10-CM

## 2020-04-15 DIAGNOSIS — Z833 Family history of diabetes mellitus: Secondary | ICD-10-CM

## 2020-04-15 DIAGNOSIS — J351 Hypertrophy of tonsils: Secondary | ICD-10-CM

## 2020-04-15 LAB — POCT URINALYSIS DIPSTICK
Bilirubin, UA: NEGATIVE
Blood, UA: NEGATIVE
Glucose, UA: NEGATIVE
Ketones, UA: NEGATIVE
Leukocytes, UA: NEGATIVE
Nitrite, UA: NEGATIVE
Protein, UA: NEGATIVE
Spec Grav, UA: 1.02
Urobilinogen, UA: 0.2 U/dL
pH, UA: 5.5

## 2020-04-15 LAB — POCT GLYCOSYLATED HEMOGLOBIN (HGB A1C)
HbA1c POC (<> result, manual entry): 5.9 % (ref 4.0–5.6)
HbA1c, POC (controlled diabetic range): 5.9 % (ref 0.0–7.0)
HbA1c, POC (prediabetic range): 5.9 % (ref 5.7–6.4)
Hemoglobin A1C: 5.9 % — AB (ref 4.0–5.6)

## 2020-04-15 MED ORDER — PENICILLIN V POTASSIUM 500 MG PO TABS: 500.0000 mg | ORAL_TABLET | Freq: Three times a day (TID) | ORAL | 0 refills | Status: AC

## 2020-04-15 NOTE — Patient Instructions (Signed)
Tonsillitis ° °Tonsillitis is an infection of the throat. This infection causes the tonsils to become red, tender, and swollen. Tonsils are tissues in the back of your throat. If bacteria caused your infection, antibiotic medicine will be given to you. Sometimes, symptoms of this infection can be treated with the use of medicines that lessen swelling (steroids). If your tonsillitis is very bad (severe) and happens often, you may need to get your tonsils removed (tonsillectomy). °Follow these instructions at home: °Medicines °· Take over-the-counter and prescription medicines only as told by your doctor. °· If you were prescribed an antibiotic, take it as told by your doctor. Do not stop taking the antibiotic even if you start to feel better. °Eating and drinking °· Drink enough fluid to keep your pee (urine) clear or pale yellow. °· While your throat is sore, eat soft or liquid foods like: °? Soup. °? Sherbert. °? Instant breakfast drinks. °· Drink warm fluids. °· Eat frozen ice pops. °General instructions °· Rest as much as possible and get plenty of sleep. °· Gargle with a salt-water mixture 3-4 times a day or as needed. To make a salt-water mixture, completely dissolve ½-1 tsp of salt in 1 cup of warm water. °· Wash your hands often with soap and water. If there is no soap and water, use hand sanitizer. °· Do not share cups, bottles, or other utensils until your symptoms are gone. °· Do not smoke. If you need help quitting, ask your doctor. °· Keep all follow-up visits as told by your doctor. This is important. °Contact a doctor if: °· You have large, tender lumps in your neck. °· You have a fever that does not go away after 2-3 days. °· You have a rash. °· You cough up green, yellow-brown, or bloody fluid. °· You cannot swallow liquids or food for 24 hours. °· Only one of your tonsils is swollen. °Get help right away if: °· You have any new symptoms such as: °? Vomiting. °? Very bad headache. °? Stiff  neck. °? Chest pain. °? Trouble breathing or swallowing. °· You have very bad throat pain and you also have drooling or voice changes. °· You have very bad pain that is not helped by medicine. °· You cannot fully open your mouth. °· You have redness, swelling, or severe pain anywhere in your neck. °Summary °· Tonsillitis causes your tonsils to be red, tender, and swollen. °· While your throat is sore, eat soft or liquid foods. °· Gargle with a salt-water mixture 3-4 times a day or as needed. °· Do not share cups, bottles, or other utensils until your symptoms are gone. °This information is not intended to replace advice given to you by your health care provider. Make sure you discuss any questions you have with your health care provider. °Document Revised: 11/01/2017 Document Reviewed: 12/25/2016 °Elsevier Patient Education © 2020 [employer] ° °

## 2020-04-15 NOTE — Progress Notes (Signed)
[facility] [place], [place]  [address] Phone:  [phone]   Fax:  [phone]   Acute Office Visit  Subjective:    Patient ID: Todd Carroll, male    DOB: Feb 10, 1976, 44 y.o.   MRN: [id number]  Chief Complaint  Patient presents with  . Sore Throat    sore throat , possible std , some fever , light cough ,light  mucus yellow     HPI Patient is in today for sore throat. He  has a past medical history of ALLERGIC RHINITIS (08/27/2007), Allergy, and GERD (05/04/2008).   Sore Throat Patient complains of sore throat. Associated symptoms include fevers up to 100 degrees, nasal blockage, post nasal drip, sinus and nasal congestion and sore throat. Onset of symptoms was 4 weeks ago, and have been unchanged since that time. He is drinking plenty of fluids. He has not had recent close exposure to someone with proven streptococcal pharyngitis. He admits that his symptoms are worse in the morning.   Past Medical History:  Diagnosis Date  . ALLERGIC RHINITIS 08/27/2007  . Allergy   . GERD 05/04/2008    Past Surgical History:  Procedure Laterality Date  . APPENDECTOMY      Family History  Problem Relation Age of Onset  . Multiple myeloma Mother   . Diabetes Mother   . Colon cancer Neg Hx   . Esophageal cancer Neg Hx   . Stomach cancer Neg Hx   . Pancreatic cancer Neg Hx   . Liver disease Neg Hx   . Rectal cancer Neg Hx     Social History   Socioeconomic History  . Marital status: Single    Spouse name: Not on file  . Number of children: Not on file  . Years of education: Not on file  . Highest education level: Not on file  Occupational History  . Not on file  Tobacco Use  . Smoking status: Never Smoker  . Smokeless tobacco: Never Used  Substance and Sexual Activity  . Alcohol use: Not Currently    Comment: social , liquor-- 1 drink every month to 2 months  . Drug use: No  . Sexual activity: Not Currently    Partners: Female, Male   Birth control/protection: Condom    Comment: pt declined condoms  Other Topics Concern  . Not on file  Social History Narrative  . Not on file   Social Determinants of Health   Financial Resource Strain:   . Difficulty of Paying Living Expenses:   Food Insecurity:   . Worried About Charity fundraiser in the Last Year:   . Arboriculturist in the Last Year:   Transportation Needs:   . Film/video editor (Medical):   Marland Kitchen Lack of Transportation (Non-Medical):   Physical Activity:   . Days of Exercise per Week:   . Minutes of Exercise per Session:   Stress:   . Feeling of Stress :   Social Connections:   . Frequency of Communication with Friends and Family:   . Frequency of Social Gatherings with Friends and Family:   . Attends Religious Services:   . Active Member of Clubs or Organizations:   . Attends Archivist Meetings:   Marland Kitchen Marital Status:   Intimate Partner Violence:   . Fear of Current or Ex-Partner:   . Emotionally Abused:   Marland Kitchen Physically Abused:   . Sexually Abused:     Outpatient Medications  Prior to Visit  Medication Sig Dispense Refill  . bictegravir-emtricitabine-tenofovir AF (BIKTARVY) 50-200-25 MG TABS tablet Take 1 tablet by mouth daily. 30 tablet 5  . clindamycin (CLINDAGEL) 1 % gel Apply topically 2 (two) times daily. 30 g 0  . hydrOXYzine (ATARAX/VISTARIL) 25 MG tablet TAKE 1 TABLET (25 MG TOTAL) BY MOUTH 3 (THREE) TIMES DAILY AS NEEDED FOR ITCHING. 90 tablet 6  . multivitamin (ONE-A-DAY MEN'S) TABS tablet Take 1 tablet by mouth daily. 100 tablet 0  . omeprazole (PRILOSEC) 20 MG capsule TAKE 2 TABLETS EVERY MORNING BEFORE EATING. 60 capsule 0  . triamcinolone (KENALOG) 0.025 % ointment Apply 1 application topically 2 (two) times daily. 30 g 0  . valACYclovir (VALTREX) 500 MG tablet TAKE 1 TABLET BY MOUTH DAILY. 30 tablet 0   No facility-administered medications prior to visit.    Allergies  Allergen Reactions  . Azithromycin Itching     Review of Systems  All other systems reviewed and are negative.      Objective:    Physical Exam Constitutional:      General: He is not in acute distress.    Appearance: He is obese. He is not ill-appearing or toxic-appearing.  HENT:     Head: Normocephalic.     Mouth/Throat:     Lips: Pink.     Mouth: Mucous membranes are moist.     Tongue: No lesions.     Palate: No lesions.     Pharynx: Oropharynx is clear.     Tonsils: No tonsillar exudate. 2+ on the right. 2+ on the left.  Eyes:     Conjunctiva/sclera: Conjunctivae normal.  Cardiovascular:     Rate and Rhythm: Normal rate and regular rhythm.     Heart sounds: Normal heart sounds.  Pulmonary:     Effort: Pulmonary effort is normal.     Breath sounds: Normal breath sounds.  Musculoskeletal:     Cervical back: Normal range of motion.  Lymphadenopathy:     Cervical: No cervical adenopathy.  Neurological:     Mental Status: He is alert and oriented to person, place, and time.     BP 117/88 (BP Location: Left Arm, Patient Position: Sitting)   Pulse 92   Temp 98 F (36.7 C)   Ht _0  (1.854 m)   Wt 269 lb (122 kg)   SpO2 100%   BMI 35.49 kg/m  Wt Readings from Last 3 Encounters:  04/15/20 269 lb (122 kg)  12/28/19 263 lb (119.3 kg)  07/06/19 261 lb (118.4 kg)    Health Maintenance Due  Topic Date Due  . COVID-19 Vaccine (1) Never done    There are no preventive care reminders to display for this patient.   Lab Results  Component Value Date   TSH 0.352 (L) 10/10/2018   Lab Results  Component Value Date   WBC 7.2 12/28/2019   HGB 13.9 12/28/2019   HCT 40.4 12/28/2019   MCV 88.6 12/28/2019   PLT 436 (H) 12/28/2019   Lab Results  Component Value Date   NA 136 12/28/2019   K 4.5 12/28/2019   CO2 25 12/28/2019   GLUCOSE 109 (H) 12/28/2019   BUN 13 12/28/2019   CREATININE 1.26 12/28/2019   BILITOT 0.3 12/28/2019   ALKPHOS 86 10/10/2018   AST 23 12/28/2019   ALT 24 12/28/2019   PROT 7.9  12/28/2019   ALBUMIN 4.3 10/10/2018   CALCIUM 9.3 12/28/2019   Lab Results  Component Value Date  CHOL 212 (H) 12/28/2019   Lab Results  Component Value Date   HDL 48 12/28/2019   Lab Results  Component Value Date   LDLCALC 138 (H) 12/28/2019   Lab Results  Component Value Date   TRIG 131 12/28/2019   Lab Results  Component Value Date   CHOLHDL 4.4 12/28/2019   Lab Results  Component Value Date   HGBA1C 5.9 (A) 04/15/2020   HGBA1C 5.9 04/15/2020   HGBA1C 5.9 04/15/2020   HGBA1C 5.9 04/15/2020       Assessment & Plan:   Problem List Items Addressed This Visit    None    Visit Diagnoses    Family history of diabetes mellitus    -  Primary   Relevant Orders   HgB A1c (Completed)   POCT Urinalysis Dipstick (Completed)   Enlarged tonsils       Extensive discussion with patient.  Admits to previous history of recurrent strep ENT referral for evaluation   Relevant Orders   Culture, Group A Strep   Ambulatory referral to ENT   Sore throat       Empirical therapy penicillin 500 mg 3 times daily for 10 days       Meds ordered this encounter  Medications  . penicillin v potassium (VEETID) 500 MG tablet    Sig: Take 1 tablet (500 mg total) by mouth 3 (three) times daily for 10 days.    Dispense:  30 tablet    Refill:  0    Order Specific Question:   Supervising Provider    Answer:   Tresa Garter [[id number]]     Vevelyn Francois, NP

## 2020-04-18 ENCOUNTER — Other Ambulatory Visit: Payer: Self-pay

## 2020-04-18 ENCOUNTER — Ambulatory Visit (INDEPENDENT_AMBULATORY_CARE_PROVIDER_SITE_OTHER): Payer: Self-pay | Admitting: Pharmacist

## 2020-04-18 DIAGNOSIS — B20 Human immunodeficiency virus [HIV] disease: Secondary | ICD-10-CM

## 2020-04-18 DIAGNOSIS — Z113 Encounter for screening for infections with a predominantly sexual mode of transmission: Secondary | ICD-10-CM

## 2020-04-18 LAB — CULTURE, GROUP A STREP: Strep A Culture: NEGATIVE

## 2020-04-18 MED ORDER — BIKTARVY 50-200-25 MG PO TABS: 1.0000 | ORAL_TABLET | Freq: Every day | ORAL | 5 refills | Status: DC

## 2020-04-18 NOTE — Progress Notes (Addendum)
HPI: Todd Carroll is a 44 y.o. male who presents to the [facility] pharmacy clinic for HIV follow-up.  Patient Active Problem List   Diagnosis Date Noted  . 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 Underinsured 07/24/2019  . Otitis externa, acute noninfectious 07/24/2019  . Abdominal pain, epigastric 10/23/2018  . Dysphagia 10/23/2018  . Constipation 10/23/2018  . 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 Scalp pruritus 07/25/2014  . Itch 05/25/2014  . Rash and nonspecific skin eruption 01/05/2014  . Folliculitis 07/13/2013  . Pain, dental 07/13/2013  . Dysuria 04/29/2013  . Human immunodeficiency virus (HIV) disease (HCC) 02/27/2013  . GERD 05/04/2008  . ALLERGIC RHINITIS 08/27/2007    Patient's Medications  New Prescriptions   No medications on file  Previous Medications   BICTEGRAVIR-EMTRICITABINE-TENOFOVIR AF (BIKTARVY) 50-200-25 MG TABS TABLET    Take 1 tablet by mouth daily.   CLINDAMYCIN (CLINDAGEL) 1 % GEL    Apply topically 2 (two) times daily.   HYDROXYZINE (ATARAX/VISTARIL) 25 MG TABLET    TAKE 1 TABLET (25 MG TOTAL) BY MOUTH 3 (THREE) TIMES DAILY AS NEEDED FOR ITCHING.   MULTIVITAMIN (ONE-A-DAY MEN'S) TABS TABLET    Take 1 tablet by mouth daily.   OMEPRAZOLE (PRILOSEC) 20 MG CAPSULE    TAKE 2 TABLETS EVERY MORNING BEFORE EATING.   PENICILLIN V POTASSIUM (VEETID) 500 MG TABLET    Take 1 tablet (500 mg total) by mouth 3 (three) times daily for 10 days.   TRIAMCINOLONE (KENALOG) 0.025 % OINTMENT    Apply 1 application topically 2 (two) times daily.   VALACYCLOVIR (VALTREX) 500 MG TABLET    TAKE 1 TABLET BY MOUTH DAILY.  Modified Medications   No medications on file  Discontinued Medications   No medications on file    Allergies: Allergies  Allergen Reactions  . Azithromycin Itching    Past Medical History: Past Medical History:  Diagnosis Date  . ALLERGIC RHINITIS 08/27/2007  . Allergy   . GERD 05/04/2008    Social History: Social History    Socioeconomic History  . Marital status: Single    Spouse name: Not on file  . Number of children: Not on file  . Years of education: Not on file  . Highest education level: Not on file  Occupational History  . Not on file  Tobacco Use  . Smoking status: Never Smoker  . Smokeless tobacco: Never Used  Substance and Sexual Activity  . Alcohol use: Not Currently    Comment: social , liquor-- 1 drink every month to 2 months  . Drug use: No  . Sexual activity: Not Currently    Partners: Female, Male    Birth control/protection: Condom    Comment: pt declined condoms  Other Topics Concern  . Not on file  Social History Narrative  . Not on file   Social Determinants of Health   Financial Resource Strain:   . Difficulty of Paying Living Expenses:   Food Insecurity:   . Worried About Programme researcher, broadcasting/film/video in the Last Year:   . Barista in the Last Year:   Transportation Needs:   . Freight forwarder (Medical):   Marland Kitchen Lack of Transportation (Non-Medical):   Physical Activity:   . Days of Exercise per Week:   . Minutes of Exercise per Session:   Stress:   . Feeling of Stress :   Social Connections:   . Frequency of Communication with Friends and Family:   . Frequency  of Social Gatherings with Friends and Family:   . Attends Religious Services:   . Active Member of Clubs or Organizations:   . Attends Banker Meetings:   Marland Kitchen Marital Status:     Labs: Lab Results  Component Value Date   HIV1RNAQUANT <20 NOT DETECTED 12/28/2019   HIV1RNAQUANT <20 NOT DETECTED 07/22/2019   HIV1RNAQUANT <20 NOT DETECTED 12/19/2018   CD4TABS 914 12/28/2019   CD4TABS 942 07/22/2019   CD4TABS 1,070 12/19/2018    RPR and STI Lab Results  Component Value Date   LABRPR NON-REACTIVE 12/28/2019   LABRPR NON-REACTIVE 12/19/2018   LABRPR Non Reactive 11/08/2017   LABRPR NON REAC 07/02/2017   LABRPR NON REAC 02/18/2017    STI Results GC GC CT CT  12/28/2019 Negative -  Negative -  12/31/2018 **POSITIVE**(A) - Negative -  12/19/2018 Negative - Negative -  07/31/2018 - - Negative -  05/15/2018 Negative - Negative -  05/15/2018 Negative - Negative -  04/11/2018 - - Negative -  11/08/2017 - - Negative -  08/06/2017 - NOT DETECTED - NOT DETECTED  07/02/2017 Negative - Negative -  02/18/2017 Negative - Negative -  07/05/2016 Negative - Negative -  07/05/2016 Negative - Negative -  12/30/2015 - NOT DETECTED - NOT DETECTED  11/14/2015 Negative* - Negative* -  11/14/2015 Negative* - Negative* -  07/27/2015 Negative - Negative -  11/02/2014 NG: Negative - CT: Negative -    Hepatitis B Lab Results  Component Value Date   HEPBSAB NONREACTIVE 03/19/2013   HEPBSAG Negative 07/06/2019   HEPBCAB NEG 03/19/2013   Hepatitis C No results found for: HEPCAB, HCVRNAPCRQN Hepatitis A Lab Results  Component Value Date   HAV NEG 03/19/2013   Lipids: Lab Results  Component Value Date   CHOL 212 (H) 12/28/2019   TRIG 131 12/28/2019   HDL 48 12/28/2019   CHOLHDL 4.4 12/28/2019   VLDL 27 07/08/2017   LDLCALC 138 (H) 12/28/2019    Current HIV Regimen: Biktarvy  Assessment: Todd Carroll is here today to follow up for his HIV infection after no showing Dr. Drue Carroll several times.  He was last seen in August 2020 and doing well. He was to return to see Dr. Drue Carroll in March but is having some transportation issues and hasn't been able to make it.  He states that he is taking his Biktarvy everyday without issues. His last HIV viral load was in January 2021 and was undetectable. He did renew his Todd Carroll, so he has had no issues getting refills.  The only complaint he is having today is a sore throat. He recently saw his PCP last week and was screened for strep and given an Rx for Penicillin which he hasn't picked up yet.  His culture was negative. I will do a STI throat swab today to make sure he is clear of any STIs, He had routine labs drawn recently, so I will just check a HIV  viral load today.  He is asking for a refill on ear drops that he had in 2019. I told him to reach out to his PCP to get this refill as that is who sent in the original Rx.  I will set him up to see Dr. Drue Carroll in July when he is due for RW renewal. Answered all questions today and will send in refills of his Biktarvy.  Plan: - Continue Biktarvy PO once daily - HIV viral load and pharyngeal cytology today - F/u with Dr. Drue Carroll 7/7  at [facility]. Laney Louderback, PharmD, BCIDP, AAHIVP, CPP Clinical Pharmacist Practitioner Infectious Diseases [facility] 04/18/2020, 10:38 AM

## 2020-04-19 LAB — CYTOLOGY, (ORAL, ANAL, URETHRAL) ANCILLARY ONLY
Chlamydia: NEGATIVE
Comment: NEGATIVE
Comment: NORMAL
Neisseria Gonorrhea: NEGATIVE

## 2020-04-20 LAB — HIV-1 RNA QUANT-NO REFLEX-BLD
HIV 1 RNA Quant: <20 copies/mL
HIV-1 RNA Quant, Log: <1.3 Log copies/mL

## 2020-05-23 ENCOUNTER — Other Ambulatory Visit: Payer: Self-pay | Admitting: Internal Medicine

## 2020-05-23 ENCOUNTER — Other Ambulatory Visit: Payer: Self-pay | Admitting: Nurse Practitioner

## 2020-05-23 DIAGNOSIS — L299 Pruritus, unspecified: Secondary | ICD-10-CM

## 2020-05-23 DIAGNOSIS — K219 Gastro-esophageal reflux disease without esophagitis: Secondary | ICD-10-CM

## 2020-05-23 MED FILL — SF 5000 PLUS CREAM: 1.1 | 30 days supply | Qty: 51 | Fill #2

## 2020-05-24 NOTE — Telephone Encounter (Signed)
Can this patient have refill on this medication

## 2020-05-25 MED FILL — TRIAMCINOLONE 0.025% OINT: 0.025 | 15 days supply | Qty: 30 | Fill #0

## 2020-06-08 ENCOUNTER — Other Ambulatory Visit: Payer: Self-pay

## 2020-06-08 ENCOUNTER — Ambulatory Visit: Payer: Self-pay

## 2020-06-08 ENCOUNTER — Ambulatory Visit (INDEPENDENT_AMBULATORY_CARE_PROVIDER_SITE_OTHER): Payer: Self-pay | Admitting: Internal Medicine

## 2020-06-08 ENCOUNTER — Encounter: Payer: Self-pay | Admitting: Internal Medicine

## 2020-06-08 VITALS — BP 138/95 | HR 88 | Temp 98.5°F | Wt 270.0 lb

## 2020-06-08 DIAGNOSIS — Z79899 Other long term (current) drug therapy: Secondary | ICD-10-CM

## 2020-06-08 DIAGNOSIS — R030 Elevated blood-pressure reading, without diagnosis of hypertension: Secondary | ICD-10-CM

## 2020-06-08 DIAGNOSIS — L739 Follicular disorder, unspecified: Secondary | ICD-10-CM

## 2020-06-08 DIAGNOSIS — L299 Pruritus, unspecified: Secondary | ICD-10-CM

## 2020-06-08 DIAGNOSIS — B20 Human immunodeficiency virus [HIV] disease: Secondary | ICD-10-CM

## 2020-06-08 DIAGNOSIS — J029 Acute pharyngitis, unspecified: Secondary | ICD-10-CM

## 2020-06-08 MED ORDER — TRIAMCINOLONE ACETONIDE 0.025 % EX OINT: 1.0000 "application " | TOPICAL_OINTMENT | Freq: Two times a day (BID) | CUTANEOUS | 0 refills | Status: DC

## 2020-06-08 MED ORDER — CLINDAMYCIN PHOSPHATE 1 % EX GEL: Freq: Two times a day (BID) | CUTANEOUS | 0 refills | Status: DC

## 2020-06-08 MED FILL — TRIAMCINOLONE 0.025% OINT: 0.025 | 15 days supply | Qty: 30 | Fill #0

## 2020-06-08 NOTE — Progress Notes (Signed)
RFV: follow up HIV disease  Patient ID: Todd Carroll, male   DOB: [birth year], 44 y.o.   MRN: [id number]  HPI 44yo M with HIV disease, on biktarvy. - takes biktarvy  Reports sore throat, feels tonsils hurt daily. Takes tylenol but without effect. He is interested in seeing ENT for evaluation for tonsillectomy   Outpatient Encounter Medications as of 06/08/2020  Medication Sig   bictegravir-emtricitabine-tenofovir AF (BIKTARVY) 50-200-25 MG TABS tablet Take 1 tablet by mouth daily.   clindamycin (CLINDAGEL) 1 % gel Apply topically 2 (two) times daily.   hydrOXYzine (ATARAX/VISTARIL) 25 MG tablet TAKE 1 TABLET (25 MG TOTAL) BY MOUTH 3 (THREE) TIMES DAILY AS NEEDED FOR ITCHING.   multivitamin (ONE-A-DAY MEN'S) TABS tablet Take 1 tablet by mouth daily.   omeprazole (PRILOSEC) 20 MG capsule TAKE 2 TABLETS EVERY MORNING BEFORE EATING.   triamcinolone (KENALOG) 0.025 % ointment APPLY 1 APPLICATION TOPICALLY 2 (TWO) TIMES DAILY.   valACYclovir (VALTREX) 500 MG tablet TAKE 1 TABLET BY MOUTH DAILY.   No facility-administered encounter medications on file as of 06/08/2020.     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 07/24/2019   Underinsured 07/24/2019   Otitis externa, acute noninfectious 07/24/2019   Abdominal pain, epigastric 10/23/2018   Dysphagia 10/23/2018   Constipation 10/23/2018   Hematospermia 11/01/2014   Scalp pruritus 07/25/2014   Itch 05/25/2014   Rash and nonspecific skin eruption 01/05/2014   Folliculitis 07/13/2013   Pain, dental 07/13/2013   Dysuria 04/29/2013   Human immunodeficiency virus (HIV) disease (HCC) 02/27/2013   GERD 05/04/2008   ALLERGIC RHINITIS 08/27/2007     Health Maintenance Due  Topic Date Due   COVID-19 Vaccine (1) Never done     Review of Systems +sore throat; 12 point ros is negative Physical Exam   BP (!) 138/95    Pulse 88    Temp 98.5 F (36.9 C)  (Oral)    Wt 270 lb (122.5 kg)    BMI 35.62 kg/m   Physical Exam  Constitutional: He is oriented to person, place, and time. He appears well-developed and well-nourished. No distress.  HENT:  Mouth/Throat: Oropharynx is clear and moist. No oropharyngeal exudate. Mildly swollen but not erythamatous.  Cardiovascular: Normal rate, regular rhythm and normal heart sounds. Exam reveals no gallop and no friction rub.  No murmur heard.  Pulmonary/Chest: Effort normal and breath sounds normal. No respiratory distress. He has no wheezes.  Abdominal: Soft. Bowel sounds are normal. He exhibits no distension. There is no tenderness.  Lymphadenopathy:  He has no cervical adenopathy.  Neurological: He is alert and oriented to person, place, and time.  Skin: Skin is warm and dry. No rash noted. No erythema.  Psychiatric: He has a normal mood and affect. His behavior is normal.    Lab Results  Component Value Date   CD4TCELL 45 12/28/2019   Lab Results  Component Value Date   CD4TABS 914 12/28/2019   CD4TABS 942 07/22/2019   CD4TABS 1,070 12/19/2018   Lab Results  Component Value Date   HIV1RNAQUANT <20 NOT DETECTED 04/18/2020   Lab Results  Component Value Date   HEPBSAB NONREACTIVE 03/19/2013   Lab Results  Component Value Date   LABRPR NON-REACTIVE 12/28/2019    CBC Lab Results  Component Value Date   WBC 7.2 12/28/2019   RBC 4.56 12/28/2019   HGB 13.9 12/28/2019   HCT 40.4 12/28/2019   PLT 436 (H)  12/28/2019   MCV 88.6 12/28/2019   MCH 30.5 12/28/2019   MCHC 34.4 12/28/2019   RDW 13.3 12/28/2019   LYMPHSABS 2,966 12/28/2019   MONOABS 553 07/02/2017   EOSABS 266 12/28/2019    BMET Lab Results  Component Value Date   NA 136 12/28/2019   K 4.5 12/28/2019   CL 103 12/28/2019   CO2 25 12/28/2019   GLUCOSE 109 (H) 12/28/2019   BUN 13 12/28/2019   CREATININE 1.26 12/28/2019   CALCIUM 9.3 12/28/2019   GFRNONAA 78 07/22/2019   GFRAA 90 07/22/2019   Assessment and  Plan  Hasn't seen ENT for intermittent tonsillitis. Wonders if he needs tonsillectomy. Lack of insurance see if can get him through [employer] program for expanded medicaid. On exam, looks benign presently. Mentioned to the patient that can have tonsiilitis with viral infection. Can treat swelling with nsaids as needed but if more often can refer to ENT  covid 19 vaccine = had 2 doses through [employer].- though we will ask him to call in his dates and lot number so that we can log into his vaccine  HIV disease= continue on biktarvy, well controlled  ckd 2 = will check labs to check Cr and also ua for proteinuria  Long term medication management = will check kidney function  Pre-hypertension = continue to monitor at next visit

## 2020-06-09 MED FILL — CLINDAMYCIN PHOSPHATE 1 % G: 1 | 15 days supply | Qty: 30 | Fill #0

## 2020-06-23 ENCOUNTER — Other Ambulatory Visit: Payer: Self-pay | Admitting: Internal Medicine

## 2020-06-23 DIAGNOSIS — K219 Gastro-esophageal reflux disease without esophagitis: Secondary | ICD-10-CM

## 2020-06-23 MED FILL — OMEPRAZOLE 20 MG CAP: 20 | 30 days supply | Qty: 60 | Fill #0

## 2020-06-23 MED FILL — TRIAMCINOLONE 0.025% OINT: 0.025 | 15 days supply | Qty: 30 | Fill #0

## 2020-06-23 MED FILL — hydrOXYzine HCL 25 MG TABS: 25 | 30 days supply | Qty: 90 | Fill #4

## 2020-06-23 MED FILL — CLINDAMYCIN PHOSPHATE 1 % G: 1 | 15 days supply | Qty: 30 | Fill #0

## 2020-06-23 MED FILL — SF 5000 PLUS CREAM: 1.1 | 30 days supply | Qty: 51 | Fill #3

## 2020-08-31 ENCOUNTER — Other Ambulatory Visit: Payer: Self-pay | Admitting: Internal Medicine

## 2020-08-31 DIAGNOSIS — K219 Gastro-esophageal reflux disease without esophagitis: Secondary | ICD-10-CM

## 2020-08-31 DIAGNOSIS — A6001 Herpesviral infection of penis: Secondary | ICD-10-CM

## 2020-09-16 ENCOUNTER — Other Ambulatory Visit: Payer: Self-pay

## 2020-09-16 ENCOUNTER — Ambulatory Visit (INDEPENDENT_AMBULATORY_CARE_PROVIDER_SITE_OTHER): Payer: Self-pay

## 2020-09-16 DIAGNOSIS — Z23 Encounter for immunization: Secondary | ICD-10-CM

## 2020-09-21 ENCOUNTER — Other Ambulatory Visit: Payer: Self-pay

## 2020-09-21 ENCOUNTER — Other Ambulatory Visit: Payer: Self-pay | Admitting: Nurse Practitioner

## 2020-09-21 ENCOUNTER — Other Ambulatory Visit: Payer: Self-pay | Admitting: Internal Medicine

## 2020-09-21 ENCOUNTER — Ambulatory Visit (INDEPENDENT_AMBULATORY_CARE_PROVIDER_SITE_OTHER): Payer: Self-pay | Admitting: Nurse Practitioner

## 2020-09-21 ENCOUNTER — Encounter: Payer: Self-pay | Admitting: Nurse Practitioner

## 2020-09-21 VITALS — BP 121/73 | HR 93 | Temp 97.2°F | Ht 73.0 in | Wt 267.0 lb

## 2020-09-21 DIAGNOSIS — L739 Follicular disorder, unspecified: Secondary | ICD-10-CM

## 2020-09-21 DIAGNOSIS — Z0000 Encounter for general adult medical examination without abnormal findings: Secondary | ICD-10-CM

## 2020-09-21 DIAGNOSIS — B354 Tinea corporis: Secondary | ICD-10-CM

## 2020-09-21 DIAGNOSIS — J029 Acute pharyngitis, unspecified: Secondary | ICD-10-CM

## 2020-09-21 DIAGNOSIS — R300 Dysuria: Secondary | ICD-10-CM

## 2020-09-21 DIAGNOSIS — K219 Gastro-esophageal reflux disease without esophagitis: Secondary | ICD-10-CM

## 2020-09-21 LAB — POCT URINALYSIS DIP (CLINITEK)
Bilirubin, UA: NEGATIVE
Blood, UA: NEGATIVE
Glucose, UA: NEGATIVE mg/dL
Ketones, POC UA: NEGATIVE mg/dL
Leukocytes, UA: NEGATIVE
Nitrite, UA: NEGATIVE
POC PROTEIN,UA: NEGATIVE
Spec Grav, UA: >=1.03 — AB (ref 1.010–1.025)
Urobilinogen, UA: 0.2 E.U./dL
pH, UA: 6 (ref 5.0–8.0)

## 2020-09-21 MED ORDER — CLOTRIMAZOLE-BETAMETHASONE 1-0.05 % EX CREA: 1.0000 "application " | TOPICAL_CREAM | Freq: Two times a day (BID) | CUTANEOUS | 0 refills | Status: DC

## 2020-09-21 MED ORDER — CIPROFLOXACIN-DEXAMETHASONE 0.3-0.1 % OT SUSP
4.0000 [drp] | Freq: Two times a day (BID) | OTIC | 0 refills | Status: DC
Start: 2020-09-21 — End: 2021-02-06

## 2020-09-21 MED FILL — SF 5000 PLUS CREAM: 1.1 | 30 days supply | Qty: 51 | Fill #4

## 2020-09-21 MED FILL — CLOTRIMAZOLE-BETAMETHASONE: 1-0.05 | 15 days supply | Qty: 30 | Fill #0

## 2020-09-21 MED FILL — CIPROFLOXACIN-DEXAMETHASONE: 0.3-0.1 | 7 days supply | Qty: 8 | Fill #0

## 2020-09-21 NOTE — Progress Notes (Signed)
[place], [place]  [address] Phone:  [phone]   Fax:  [phone]   Acute Office Visit  Subjective:    Patient ID: Todd Carroll, male    DOB: 1976-07-28, 44 y.o.   MRN: [id number]  Chief Complaint  Patient presents with   Follow-up    having sore throat, burning when urinate,   Rash    small rash on bilateral arms, having bilateral ear itching had ear drops in past    HPI Patient is in today for sore throat. He  Sore Throat Patient complains of sore throat. Associated symptoms include sore throat. Onset of symptoms was several weeks ago, and have been unchanged since that time. He is drinking plenty of fluids. He has not had recent close exposure to someone with proven streptococcal pharyngitis.  Rash Patient presents for evaluation of a rash involving the upper extremity. Rash started 1 week ago. Lesions are red, and raised in texture. Rash has changed over time. Rash causes no discomfort. Associated symptoms: none. Patient denies: abdominal pain, arthralgia, congestion, cough, crankiness, fever, headache, irritability, myalgia, nausea, sore throat and vomiting. Patient has had contacts with similar rash. Patient has not had new exposures (soaps, lotions, laundry detergents, foods, medications, plants, insects or animals).  You may have been exposed to ringworm at a child's birthday party  Urinary Tract Infection Patient complains of burning with urination. He has had symptoms for 1 week. Patient has no additional complaints. Patient denies back pain, congestion, fever, headache, stomach ache and vaginal discharge. Patient does not have a history of recurrent UTI. Patient does not have a history of pyelonephritis.    Past Medical History:  Diagnosis Date   ALLERGIC RHINITIS 08/27/2007   Allergy    GERD 05/04/2008    Past Surgical History:  Procedure Laterality Date   APPENDECTOMY      Family History  Problem  Relation Age of Onset   Multiple myeloma Mother    Diabetes Mother    Colon cancer Neg Hx    Esophageal cancer Neg Hx    Stomach cancer Neg Hx    Pancreatic cancer Neg Hx    Liver disease Neg Hx    Rectal cancer Neg Hx     Social History   Socioeconomic History   Marital status: Single    Spouse name: Not on file   Number of children: Not on file   Years of education: Not on file   Highest education level: Not on file  Occupational History   Not on file  Tobacco Use   Smoking status: Never Smoker   Smokeless tobacco: Never Used  Vaping Use   Vaping Use: Never used  Substance and Sexual Activity   Alcohol use: Not Currently    Comment: social , liquor-- 1 drink every month to 2 months   Drug use: No   Sexual activity: Not Currently    Partners: Female, Male    Birth control/protection: Condom    Comment: pt given condoms  Other Topics Concern   Not on file  Social History Narrative   Not on file   Social Determinants of Health   Financial Resource Strain:    Difficulty of Paying Living Expenses: Not on file  Food Insecurity:    Worried About Charity fundraiser in the Last Year: Not on file   [employer] of Food in the Last Year: Not on file  Transportation Needs:  Lack of Transportation (Medical): Not on file   Lack of Transportation (Non-Medical): Not on file  Physical Activity:    Days of Exercise per Week: Not on file   Minutes of Exercise per Session: Not on file  Stress:    Feeling of Stress : Not on file  Social Connections:    Frequency of Communication with Friends and Family: Not on file   Frequency of Social Gatherings with Friends and Family: Not on file   Attends Religious Services: Not on file   Active Member of Clubs or Organizations: Not on file   Attends Archivist Meetings: Not on file   Marital Status: Not on file  Intimate Partner Violence:    Fear of Current or Ex-Partner: Not on file    Emotionally Abused: Not on file   Physically Abused: Not on file   Sexually Abused: Not on file    Outpatient Medications Prior to Visit  Medication Sig Dispense Refill   bictegravir-emtricitabine-tenofovir AF (BIKTARVY) 50-200-25 MG TABS tablet Take 1 tablet by mouth daily. 30 tablet 5   clindamycin (CLINDAGEL) 1 % gel Apply topically 2 (two) times daily. 30 g 0   hydrOXYzine (ATARAX/VISTARIL) 25 MG tablet TAKE 1 TABLET (25 MG TOTAL) BY MOUTH 3 (THREE) TIMES DAILY AS NEEDED FOR ITCHING. 90 tablet 6   multivitamin (ONE-A-DAY MEN'S) TABS tablet Take 1 tablet by mouth daily. 100 tablet 0   omeprazole (PRILOSEC) 20 MG capsule TAKE 2 CAPSULES BY MOUTH EVERY MORNING BEFORE EATING 60 capsule 2   triamcinolone (KENALOG) 0.025 % ointment Apply 1 application topically 2 (two) times daily. 30 g 0   valACYclovir (VALTREX) 500 MG tablet TAKE 1 TABLET BY MOUTH DAILY 60 tablet 1   No facility-administered medications prior to visit.    Allergies  Allergen Reactions   Azithromycin Itching    Review of Systems     Objective:    Physical Exam Constitutional:      Appearance: He is obese.  HENT:     Head: Normocephalic and atraumatic.     Ears:     Comments: White cloud to left tympanic membrane    Nose: Nose normal.     Mouth/Throat:     Comments: Left enlarged tonsil with erythema Cardiovascular:     Rate and Rhythm: Normal rate and regular rhythm.     Pulses: Normal pulses.     Heart sounds: Normal heart sounds.  Pulmonary:     Effort: Pulmonary effort is normal.     Breath sounds: Normal breath sounds.  Musculoskeletal:        General: Normal range of motion.     Cervical back: Normal range of motion.  Skin:    Capillary Refill: Capillary refill takes less than 2 seconds.     Findings: Rash (Medial aspect right forearm and medial aspect of the left forearm) present.  Neurological:     General: No focal deficit present.     Mental Status: He is alert and oriented to  person, place, and time.  Psychiatric:        Mood and Affect: Mood normal.        Behavior: Behavior normal.        Thought Content: Thought content normal.        Judgment: Judgment normal.     BP 121/73 (BP Location: Left Arm, Patient Position: Sitting, Cuff Size: Normal)    Pulse 93    Temp (!) 97.2 F (36.2 C) (Temporal)  Ht 6' 1"  (1.854 m)    Wt 267 lb (121.1 kg)    SpO2 97%    BMI 35.23 kg/m  Wt Readings from Last 3 Encounters:  09/21/20 267 lb (121.1 kg)  06/08/20 270 lb (122.5 kg)  04/15/20 269 lb (122 kg)    There are no preventive care reminders to display for this patient.  There are no preventive care reminders to display for this patient.   Lab Results  Component Value Date   TSH 0.352 (L) 10/10/2018   Lab Results  Component Value Date   WBC 7.2 12/28/2019   HGB 13.9 12/28/2019   HCT 40.4 12/28/2019   MCV 88.6 12/28/2019   PLT 436 (H) 12/28/2019   Lab Results  Component Value Date   NA 136 12/28/2019   K 4.5 12/28/2019   CO2 25 12/28/2019   GLUCOSE 109 (H) 12/28/2019   BUN 13 12/28/2019   CREATININE 1.26 12/28/2019   BILITOT 0.3 12/28/2019   ALKPHOS 86 10/10/2018   AST 23 12/28/2019   ALT 24 12/28/2019   PROT 7.9 12/28/2019   ALBUMIN 4.3 10/10/2018   CALCIUM 9.3 12/28/2019   Lab Results  Component Value Date   CHOL 212 (H) 12/28/2019   Lab Results  Component Value Date   HDL 48 12/28/2019   Lab Results  Component Value Date   LDLCALC 138 (H) 12/28/2019   Lab Results  Component Value Date   TRIG 131 12/28/2019   Lab Results  Component Value Date   CHOLHDL 4.4 12/28/2019   Lab Results  Component Value Date   HGBA1C 5.9 (A) 04/15/2020   HGBA1C 5.9 04/15/2020   HGBA1C 5.9 04/15/2020   HGBA1C 5.9 04/15/2020       Assessment & Plan:   Problem List Items Addressed This Visit     Tinea corporis Topical antifungal   Other   Dysuria Urinalysis negative will culture STD screening performed due to high risk behavior    Relevant Orders   Chlamydia/Gonococcus/Trichomonas, NAA   Urine Culture   POCT URINALYSIS DIP (CLINITEK) (Completed)    Other Visit Diagnoses    Sore throat    -  Primary Left enlarged tonsil Encourage patient to apply for_for referral to ENT for recurrent sore throat   Healthcare maintenance       Relevant Orders   Comp. Metabolic Panel (12)       Meds ordered this encounter  Medications   ciprofloxacin-dexamethasone (CIPRODEX) OTIC suspension    Sig: Place 4 drops into the right ear 2 (two) times daily.    Dispense:  7.5 mL    Refill:  0    Order Specific Question:   Supervising Provider    Answer:   Tresa Garter [[id number]]   clotrimazole-betamethasone (LOTRISONE) cream    Sig: Apply 1 application topically 2 (two) times daily.    Dispense:  30 g    Refill:  0    Order Specific Question:   Supervising Provider    Answer:   Tresa Garter [[id number]]     Vevelyn Francois, NP

## 2020-09-21 NOTE — Progress Notes (Signed)
[facility] Case Management Referral Note  09/21/2020 Name: Todd Carroll MRN: [id number] DOB: [birth year] Todd Carroll is a 44 y.o. year old male who sees Todd Merino, NP for primary care. LCSW was consulted to assess patient's needs and assist the patient with Financial Difficulties related to no income and no health coverage.  Interpreter: No.   Interpreter Name & Language: none  Assessment: Patient experiencing Financial constraints related to no income and no health coverage. Patient has previously had an [employer] and needs to reapply. Patient currently does not have income; reports being denied for disability in the past; denial appeal windows have passed.  Intervention: Provided patient with [employer] and [employer] applications and reviewed necessary supporting documents patient will need to submit with the applications. Advised patient that referral to [employer] of [facility]) can be made to assist with disability appeal if needed. CSW available to assist in scheduling with financial counselor to submit [employer] and [employer] applications and to make [facility] referral as needed.  Review of patient status, including review of consultants reports, relevant laboratory and other test results, and collaboration with appropriate care team members and the patient's provider was performed as part of comprehensive patient evaluation and provision of services.    SDOH (Social Determinants of Health) assessments performed: No    Outpatient Encounter Medications as of 09/21/2020  Medication Sig  . bictegravir-emtricitabine-tenofovir AF (BIKTARVY) 50-200-25 MG TABS tablet Take 1 tablet by mouth daily.  . clindamycin (CLINDAGEL) 1 % gel Apply topically 2 (two) times daily.  . hydrOXYzine (ATARAX/VISTARIL) 25 MG tablet TAKE 1 TABLET (25 MG TOTAL) BY MOUTH 3 (THREE) TIMES DAILY AS NEEDED FOR ITCHING.  . multivitamin (ONE-A-DAY MEN'S) TABS tablet Take 1 tablet by  mouth daily.  Marland Kitchen omeprazole (PRILOSEC) 20 MG capsule TAKE 2 CAPSULES BY MOUTH EVERY MORNING BEFORE EATING  . triamcinolone (KENALOG) 0.025 % ointment Apply 1 application topically 2 (two) times daily.  . valACYclovir (VALTREX) 500 MG tablet TAKE 1 TABLET BY MOUTH DAILY  . ciprofloxacin-dexamethasone (CIPRODEX) OTIC suspension Place 4 drops into the right ear 2 (two) times daily.  . clotrimazole-betamethasone (LOTRISONE) cream Apply 1 application topically 2 (two) times daily.   No facility-administered encounter medications on file as of 09/21/2020.    Goals Addressed   None      Follow up Plan: 1.Patient to complete [employer] and [employer] and gather supporting documents 2. CSW available to assist in scheduling with financial counselor at [facility] to submit applications if needed  Todd Butts, LCSW [facility] [phone]

## 2020-09-22 ENCOUNTER — Other Ambulatory Visit: Payer: Self-pay | Admitting: Internal Medicine

## 2020-09-22 ENCOUNTER — Ambulatory Visit: Payer: Self-pay | Admitting: Nurse Practitioner

## 2020-09-22 ENCOUNTER — Telehealth: Payer: Self-pay

## 2020-09-22 DIAGNOSIS — L739 Follicular disorder, unspecified: Secondary | ICD-10-CM

## 2020-09-22 MED FILL — OMEPRAZOLE 20 MG CAP: 20 | 30 days supply | Qty: 60 | Fill #0

## 2020-09-22 NOTE — Telephone Encounter (Signed)
Received refill request for clindamycin 1% gel for folliculitis. Routing to provider if ok to refill. Attempt to call patient to confirm if he was still using medication. Call was "unavaliable" at this time. Valarie Cones

## 2020-09-23 LAB — COMP. METABOLIC PANEL (12)
AST: 18 IU/L (ref 0–40)
Albumin/Globulin Ratio: 1.4 (ref 1.2–2.2)
Albumin: 4.4 g/dL (ref 4.0–5.0)
Alkaline Phosphatase: 127 IU/L — ABNORMAL HIGH (ref 44–121)
BUN/Creatinine Ratio: 10 (ref 9–20)
BUN: 11 mg/dL (ref 6–24)
Bilirubin Total: 0.4 mg/dL (ref 0.0–1.2)
Calcium: 9.4 mg/dL (ref 8.7–10.2)
Chloride: 101 mmol/L (ref 96–106)
Creatinine, Ser: 1.12 mg/dL (ref 0.76–1.27)
GFR calc Af Amer: 93 mL/min/{1.73_m2} (ref >59)
GFR calc non Af Amer: 80 mL/min/{1.73_m2} (ref >59)
Globulin, Total: 3.1 g/dL (ref 1.5–4.5)
Glucose: 92 mg/dL (ref 65–99)
Potassium: 4.4 mmol/L (ref 3.5–5.2)
Sodium: 139 mmol/L (ref 134–144)
Total Protein: 7.5 g/dL (ref 6.0–8.5)

## 2020-09-23 LAB — URINE CULTURE: Organism ID, Bacteria: NO GROWTH

## 2020-09-26 ENCOUNTER — Other Ambulatory Visit: Payer: Self-pay | Admitting: Internal Medicine

## 2020-09-26 DIAGNOSIS — L739 Follicular disorder, unspecified: Secondary | ICD-10-CM

## 2020-09-26 DIAGNOSIS — L299 Pruritus, unspecified: Secondary | ICD-10-CM

## 2020-09-27 MED FILL — TRIAMCINOLONE 0.025% OINT: 0.025 | 7 days supply | Qty: 30 | Fill #0

## 2020-09-28 MED ORDER — CLINDAMYCIN PHOSPHATE 1 % EX GEL: Freq: Two times a day (BID) | CUTANEOUS | 0 refills | Status: DC

## 2020-09-28 NOTE — Addendum Note (Signed)
Addended by: Valarie Cones on: 09/28/2020 02:25 PM   Modules accepted: Orders

## 2020-09-28 NOTE — Telephone Encounter (Signed)
Yes, please refill

## 2020-09-29 ENCOUNTER — Telehealth: Payer: Self-pay | Admitting: Nurse Practitioner

## 2020-09-29 NOTE — Telephone Encounter (Signed)
Please see what he needs Thanks

## 2020-09-30 ENCOUNTER — Other Ambulatory Visit: Payer: Self-pay | Admitting: Nurse Practitioner

## 2020-09-30 ENCOUNTER — Other Ambulatory Visit: Payer: Self-pay | Admitting: Internal Medicine

## 2020-09-30 ENCOUNTER — Other Ambulatory Visit: Payer: Self-pay

## 2020-09-30 DIAGNOSIS — K219 Gastro-esophageal reflux disease without esophagitis: Secondary | ICD-10-CM

## 2020-09-30 DIAGNOSIS — L739 Follicular disorder, unspecified: Secondary | ICD-10-CM

## 2020-09-30 MED ORDER — OMEPRAZOLE 20 MG PO CPDR: DELAYED_RELEASE_CAPSULE | ORAL | 0 refills | Status: DC

## 2020-10-20 ENCOUNTER — Other Ambulatory Visit: Payer: Self-pay | Admitting: Internal Medicine

## 2020-10-20 MED ORDER — CLINDAMYCIN PHOSPHATE 1 % EX GEL: Freq: Two times a day (BID) | CUTANEOUS | 0 refills | Status: DC

## 2020-10-20 MED FILL — CLINDAMYCIN PHOSPHATE 1 % G: 1 | 15 days supply | Qty: 30 | Fill #0

## 2020-10-20 NOTE — Addendum Note (Signed)
Addended by: Andree Coss on: 10/20/2020 11:07 AM   Modules accepted: Orders

## 2020-10-20 NOTE — Telephone Encounter (Signed)
Patient requested refill to be sent to [facility] pharmacy instead of [facility]. Asked for 39month supply, RN stated this would have to be approved by MD.  He is overdue for visit, scheduled him for 12/6 at 9 with Dr Drue Second. He was confused about labs, why they would need to be drawn. RN explained that we draw specific labs to monitor his medication that we prescribe. Patient states he still has a sore throat, does not think it is allergies like everyone has been telling him. He has not followed up with ENT as suggested by PCP, has not had a throat swab for STI testing. Andree Coss, RN

## 2020-10-23 IMAGING — US US ABDOMEN COMPLETE
1 series · 13 of 25 positions shown · non-contrast
Comparison: CT abdomen and pelvis May 10, 2006

CLINICAL DATA: Epigastric pain.

EXAM:
ABDOMEN ULTRASOUND COMPLETE

[Series 1: us abdomen complete · 13 of 93 slices shown]
[im 1/93]
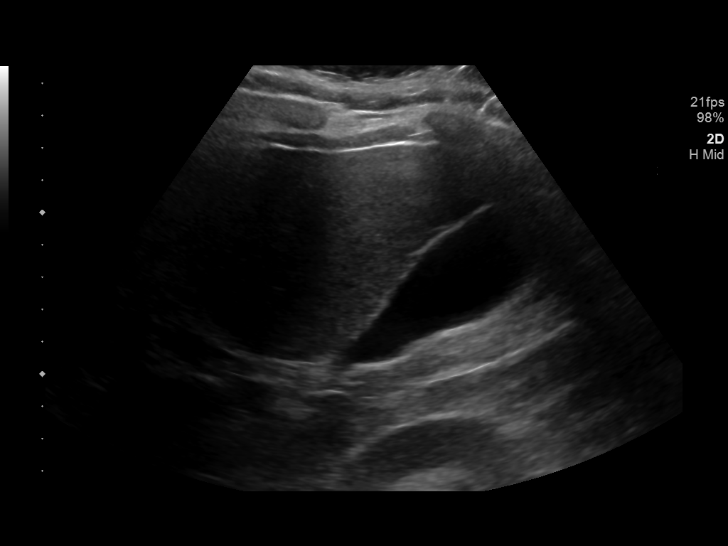
[im 8/93]
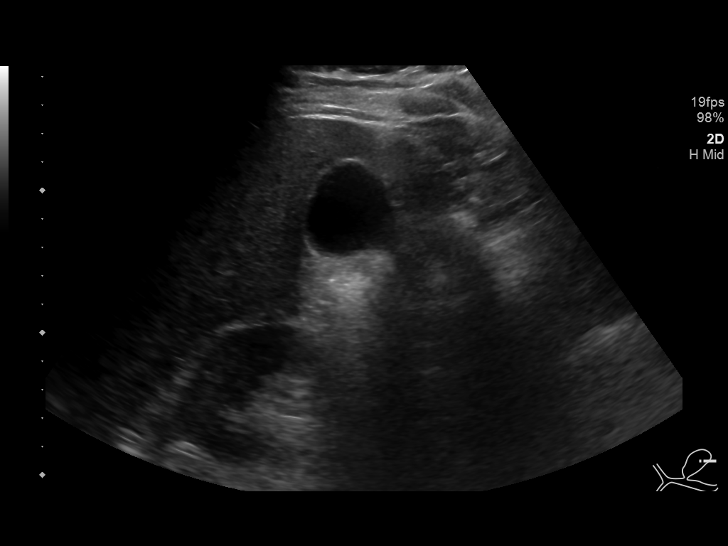
[im 16/93]
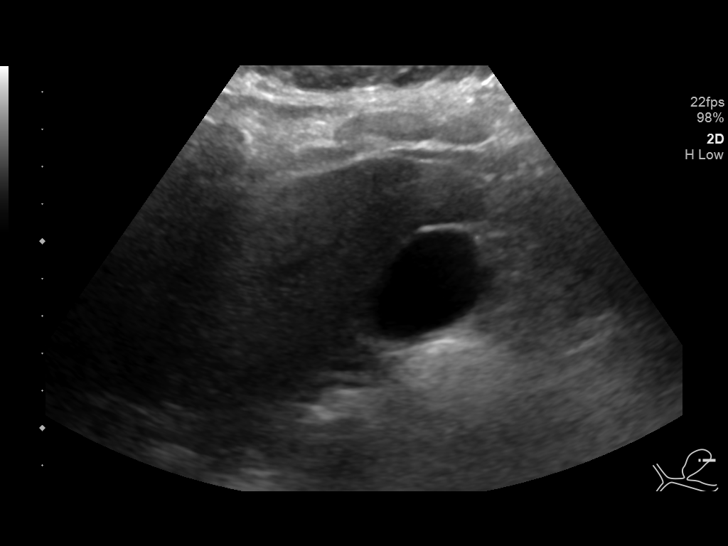
[im 24/93]
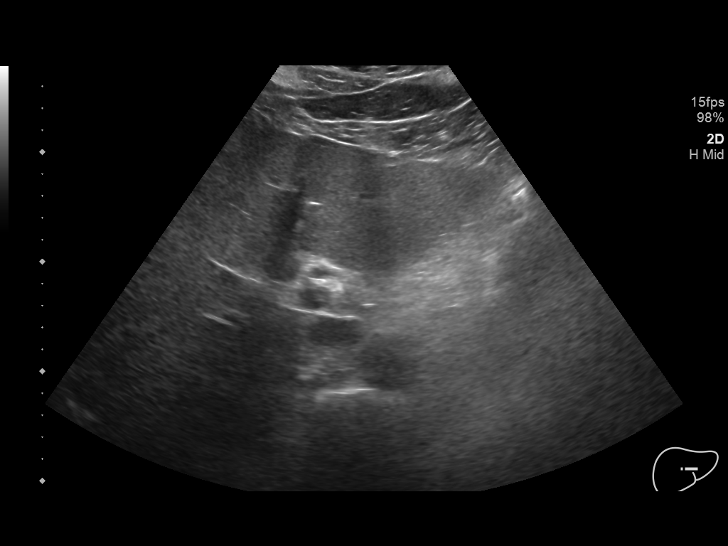
[im 31/93]
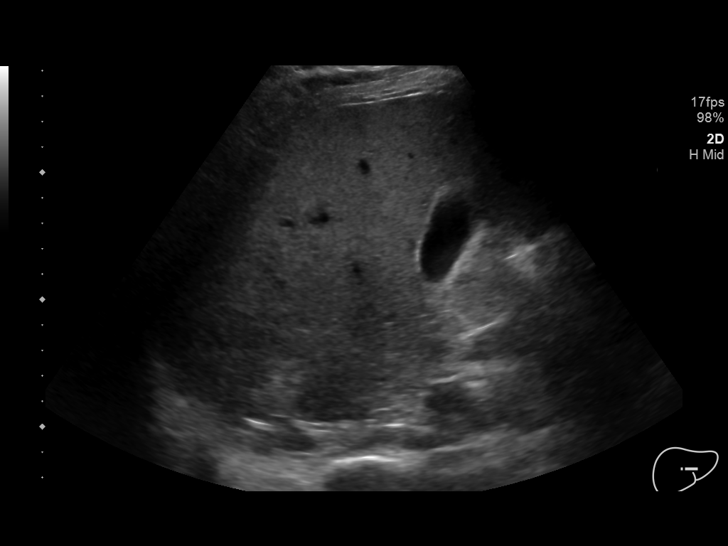
[im 39/93]
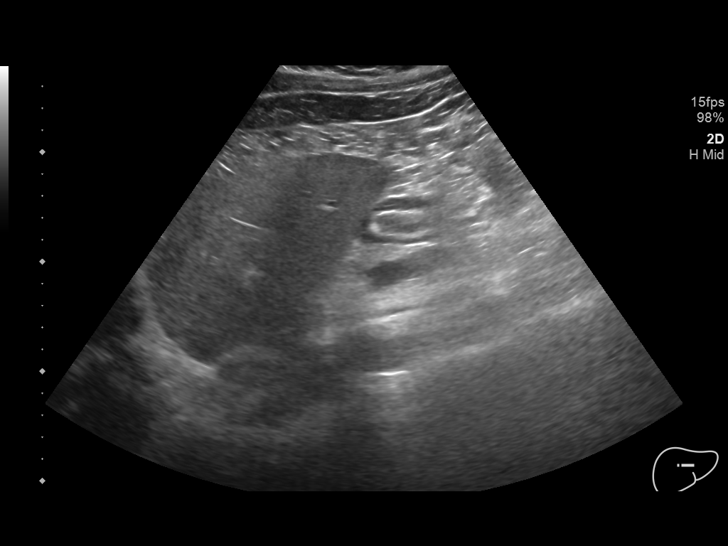
[im 47/93]
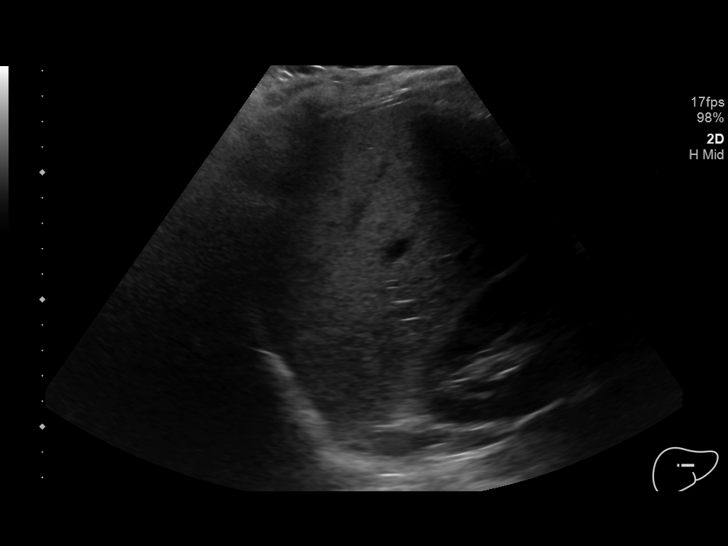
[im 54/93]
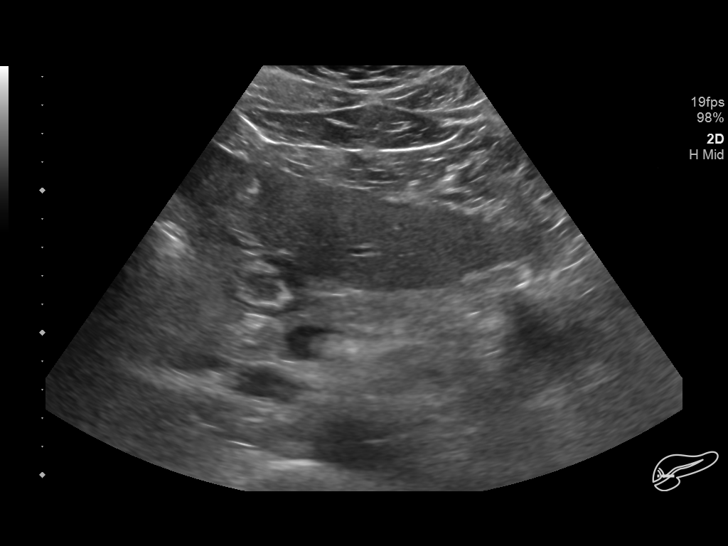
[im 62/93]
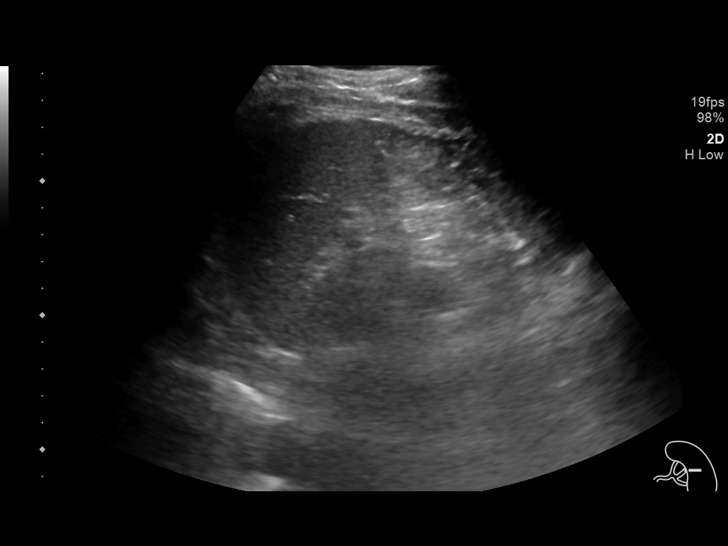
[im 70/93]
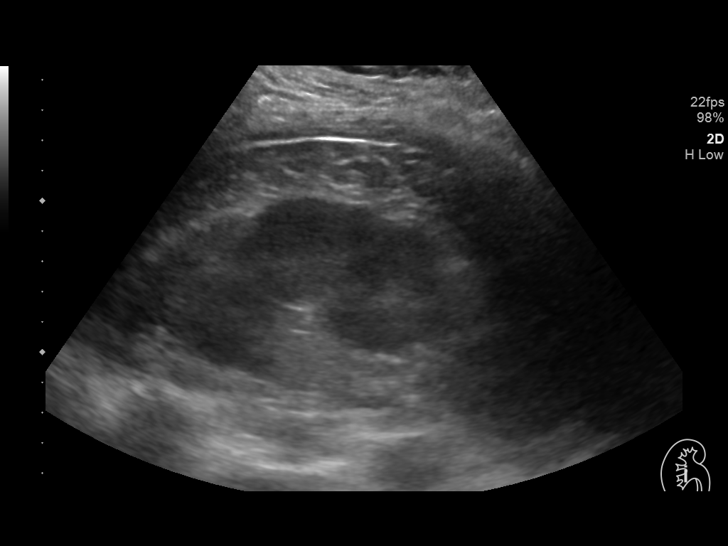
[im 77/93]
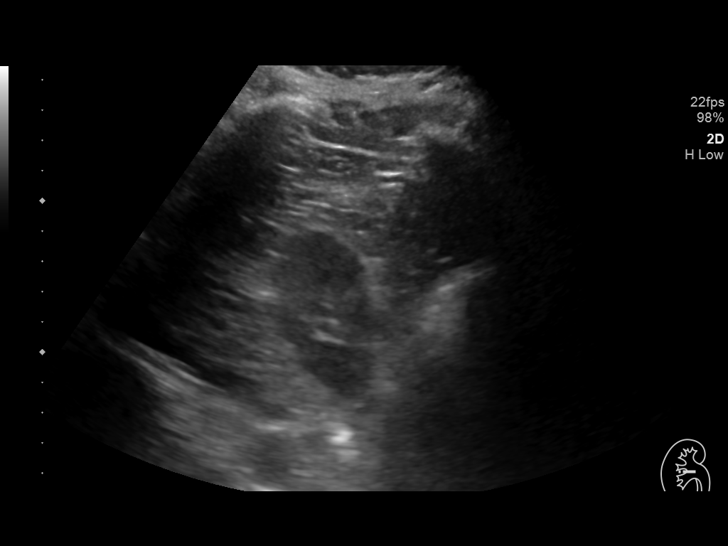
[im 85/93]
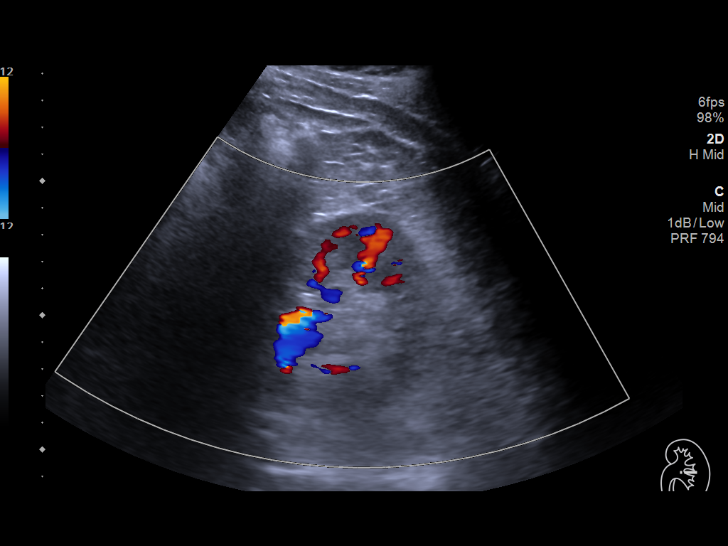
[im 93/93]
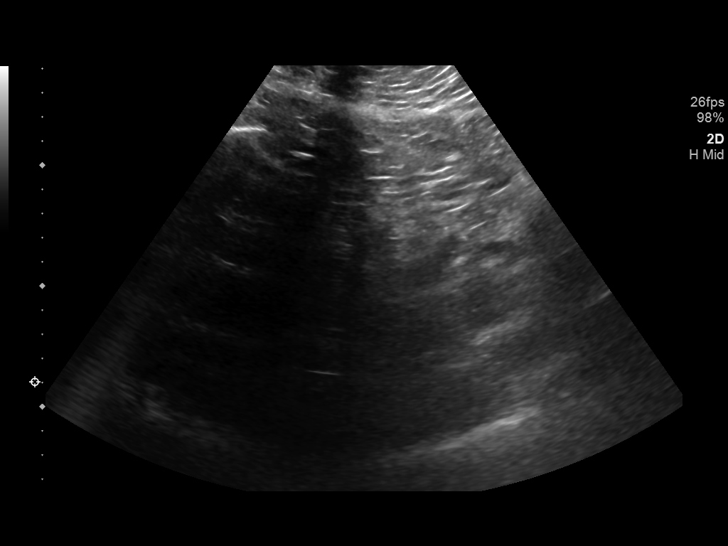

[13 of 25 positions shown; findings below may reference images not displayed]

FINDINGS: Gallbladder: No gallstones or wall thickening visualized. There is
no pericholecystic fluid. No sonographic Murphy sign noted by
sonographer.

Common bile duct: Diameter: 3 mm. No intrahepatic, common hepatic,
or common bile duct dilatation.

Liver: There is a cyst in the left lobe of the liver measuring 2.1 x
2.4 x 1.8 cm. A second cyst measures 1.1 x 0.4 x 0.7 cm.. Within
normal limits in parenchymal echogenicity. Portal vein is patent on
color Doppler imaging with normal direction of blood flow towards
the liver.

IVC: No abnormality visualized.

Pancreas: Visualized portion unremarkable. Portions of pancreas
obscured by gas.

Spleen: Size and appearance within normal limits.

Right Kidney: Length: 12.5 cm. Echogenicity within normal limits. No
hydronephrosis visualized. There is a cyst in the lower pole right
kidney measuring 0.9 x 0.8 x 0.9 cm

Left Kidney: Length: 12.6 cm. Echogenicity within normal limits. No
mass or hydronephrosis visualized. There is an apparent 1.4 cm
calculus in the lower pole left kidney.

Abdominal aorta: No aneurysm visualized.

Other findings: No demonstrable ascites.
IMPRESSION: 1.  1.4 cm nonobstructing calculus lower pole left kidney.

2.  Small cyst lower pole right kidney.

3.  Small cysts in left lobe of liver.

4. Portions of pancreas obscured by gas. Visualized portions of
pancreas appear unremarkable.

5.  Study otherwise unremarkable.

## 2020-11-07 ENCOUNTER — Ambulatory Visit: Payer: Self-pay | Admitting: Internal Medicine

## 2020-11-08 ENCOUNTER — Ambulatory Visit (INDEPENDENT_AMBULATORY_CARE_PROVIDER_SITE_OTHER): Payer: Self-pay | Admitting: Internal Medicine

## 2020-11-08 ENCOUNTER — Ambulatory Visit: Payer: Self-pay | Attending: Internal Medicine

## 2020-11-08 ENCOUNTER — Telehealth: Payer: Self-pay

## 2020-11-08 ENCOUNTER — Other Ambulatory Visit: Payer: Self-pay

## 2020-11-08 VITALS — BP 128/88 | HR 81 | Temp 98.1°F | Wt 266.0 lb

## 2020-11-08 DIAGNOSIS — B20 Human immunodeficiency virus [HIV] disease: Secondary | ICD-10-CM

## 2020-11-08 DIAGNOSIS — Z79899 Other long term (current) drug therapy: Secondary | ICD-10-CM

## 2020-11-08 DIAGNOSIS — L739 Follicular disorder, unspecified: Secondary | ICD-10-CM

## 2020-11-08 DIAGNOSIS — Z113 Encounter for screening for infections with a predominantly sexual mode of transmission: Secondary | ICD-10-CM

## 2020-11-08 DIAGNOSIS — Z23 Encounter for immunization: Secondary | ICD-10-CM

## 2020-11-08 MED ORDER — CLINDAMYCIN PHOSPHATE 1 % EX GEL: Freq: Two times a day (BID) | CUTANEOUS | 0 refills | Status: DC

## 2020-11-08 MED ORDER — BIKTARVY 50-200-25 MG PO TABS: 1.0000 | ORAL_TABLET | Freq: Every day | ORAL | 5 refills | Status: DC

## 2020-11-08 NOTE — Progress Notes (Signed)
   Covid-19 Vaccination Clinic  Name:  Todd Carroll    MRN: [id number] DOB: [birth year]  11/08/2020  Todd Carroll was observed post Covid-19 immunization for 15 minutes without incident. He was provided with Vaccine Information Sheet and instruction to access the V-Safe system.   Todd Carroll was instructed to call 911 with any severe reactions post vaccine: Marland Kitchen Difficulty breathing  . Swelling of face and throat  . A fast heartbeat  . A bad rash all over body  . Dizziness and weakness   Immunizations Administered    Name Date Dose VIS Date Route   Pfizer COVID-19 Vaccine 11/08/2020  3:38 PM 0.3 mL 09/21/2020 Intramuscular   Manufacturer: [employer], [employer]   Lot: [id number]   NDC: [address]-8

## 2020-11-08 NOTE — Progress Notes (Signed)
RFV: follow up for hiv disease  Patient ID: Todd Carroll, male   DOB: 06-May-1976, 44 y.o.   MRN: [id number]  HPI Todd Carroll is a 44yo M with hiv disease, CD 4 count of 914/VL <20 (may 2021) doing well with biktarvy, not missing doses. He has not yet had his covid booster and would like to see if clinic can administer it. He needs refills on a few medications. Otherwise he is doing well.  Outpatient Encounter Medications as of 11/08/2020  Medication Sig  . bictegravir-emtricitabine-tenofovir AF (BIKTARVY) 50-200-25 MG TABS tablet Take 1 tablet by mouth daily.  . ciprofloxacin-dexamethasone (CIPRODEX) OTIC suspension Place 4 drops into the right ear 2 (two) times daily.  . clindamycin (CLINDAGEL) 1 % gel Apply topically 2 (two) times daily.  . clotrimazole-betamethasone (LOTRISONE) cream Apply 1 application topically 2 (two) times daily.  . hydrOXYzine (ATARAX/VISTARIL) 25 MG tablet TAKE 1 TABLET (25 MG TOTAL) BY MOUTH 3 (THREE) TIMES DAILY AS NEEDED FOR ITCHING.  . multivitamin (ONE-A-DAY MEN'S) TABS tablet Take 1 tablet by mouth daily.  Marland Kitchen omeprazole (PRILOSEC) 20 MG capsule TAKE 2 CAPSULES EVERY MORNING BEFORE EATING.  . triamcinolone (KENALOG) 0.025 % ointment APPLY 1 APPLICATION TOPICALLY 2 (TWO) TIMES DAILY.  . valACYclovir (VALTREX) 500 MG tablet TAKE 1 TABLET BY MOUTH DAILY   No facility-administered encounter medications on file as of 11/08/2020.     Patient Active Problem List   Diagnosis Date Noted  . 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 Underinsured 07/24/2019  . Otitis externa, acute noninfectious 07/24/2019  . Abdominal pain, epigastric 10/23/2018  . Dysphagia 10/23/2018  . Constipation 10/23/2018  . 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 Scalp pruritus 07/25/2014  . Itch 05/25/2014  . Rash and nonspecific skin eruption 01/05/2014  . Folliculitis 07/13/2013  . Pain, dental 07/13/2013  . Dysuria 04/29/2013  . Human immunodeficiency virus (HIV) disease  (HCC) 02/27/2013  . GERD 05/04/2008  . ALLERGIC RHINITIS 08/27/2007   Soc hx: rare occasional etoh.  There are no preventive care reminders to display for this patient.   Review of Systems Review of Systems  Constitutional: Negative for fever, chills, diaphoresis, activity change, appetite change, fatigue and unexpected weight change.  HENT: Negative for congestion, sore throat, rhinorrhea, sneezing, trouble swallowing and sinus pressure.  Eyes: Negative for photophobia and visual disturbance.  Respiratory: Negative for cough, chest tightness, shortness of breath, wheezing and stridor.  Cardiovascular: Negative for chest pain, palpitations and leg swelling.  Gastrointestinal: Negative for nausea, vomiting, abdominal pain, diarrhea, constipation, blood in stool, abdominal distention and anal bleeding.  Genitourinary: Negative for dysuria, hematuria, flank pain and difficulty urinating.  Musculoskeletal: Negative for myalgias, back pain, joint swelling, arthralgias and gait problem.  Skin: Negative for color change, pallor, rash and wound.  Neurological: Negative for dizziness, tremors, weakness and light-headedness.  Hematological: Negative for adenopathy. Does not bruise/bleed easily.  Psychiatric/Behavioral: Negative for behavioral problems, confusion, sleep disturbance, dysphoric mood, decreased concentration and agitation.    Physical Exam   Wt 266 lb (120.7 kg)   BMI 35.09 kg/m   Physical Exam  Constitutional: He is oriented to person, place, and time. He appears well-developed and well-nourished. No distress.  HENT:  Mouth/Throat: Oropharynx is clear and moist. No oropharyngeal exudate.  Cardiovascular: Normal rate, regular rhythm and normal heart sounds. Exam reveals no gallop and no friction rub.  No murmur heard.  Pulmonary/Chest: Effort normal and breath sounds normal. No respiratory distress. He has no wheezes.  Abdominal: Soft. Bowel  sounds are normal. He exhibits no  distension. There is no tenderness.  Lymphadenopathy:  He has no cervical adenopathy.  Neurological: He is alert and oriented to person, place, and time.  Skin: Skin is warm and dry. No rash noted. No erythema.  Psychiatric: He has a normal mood and affect. His behavior is normal.    Lab Results  Component Value Date   CD4TCELL 45 12/28/2019   Lab Results  Component Value Date   CD4TABS 914 12/28/2019   CD4TABS 942 07/22/2019   CD4TABS 1,070 12/19/2018   Lab Results  Component Value Date   HIV1RNAQUANT <20 NOT DETECTED 04/18/2020   Lab Results  Component Value Date   HEPBSAB NONREACTIVE 03/19/2013   Lab Results  Component Value Date   LABRPR NON-REACTIVE 12/28/2019    CBC Lab Results  Component Value Date   WBC 7.2 12/28/2019   RBC 4.56 12/28/2019   HGB 13.9 12/28/2019   HCT 40.4 12/28/2019   PLT 436 (H) 12/28/2019   MCV 88.6 12/28/2019   MCH 30.5 12/28/2019   MCHC 34.4 12/28/2019   RDW 13.3 12/28/2019   LYMPHSABS 2,966 12/28/2019   MONOABS 553 07/02/2017   EOSABS 266 12/28/2019    BMET Lab Results  Component Value Date   NA 139 09/21/2020   K 4.4 09/21/2020   CL 101 09/21/2020   CO2 25 12/28/2019   GLUCOSE 92 09/21/2020   BUN 11 09/21/2020   CREATININE 1.12 09/21/2020   CALCIUM 9.4 09/21/2020   GFRNONAA 80 09/21/2020   GFRAA 93 09/21/2020      Assessment and Plan  hiv = refills on biktarvy, and also time to check hiv labs and sti screening  Long term medication management = cr is stable while on bitkarvy  Acne = gave refill on clindamycin cream  Health maintenance = needs covid #3 booster, will check lipid profile

## 2020-11-08 NOTE — Telephone Encounter (Signed)
Patient called requesting copy of COVID vaccine records. Records printed, placed in sealed envelope, and left at front desk for patient to pick up.  Sandie Ano, RN

## 2020-11-09 LAB — T-HELPER CELL (CD4) - (RCID CLINIC ONLY)
CD4 % Helper T Cell: 42 % (ref 33–65)
CD4 T Cell Abs: 817 /uL (ref 400–1790)

## 2020-11-10 LAB — URINE CYTOLOGY ANCILLARY ONLY
Chlamydia: NEGATIVE
Comment: NEGATIVE
Comment: NORMAL
Neisseria Gonorrhea: NEGATIVE

## 2020-11-10 LAB — CBC WITH DIFFERENTIAL/PLATELET
Absolute Monocytes: 531 cells/uL (ref 200–950)
Basophils Absolute: 18 cells/uL (ref 0–200)
Basophils Relative: 0.3 %
Eosinophils Absolute: 262 cells/uL (ref 15–500)
Eosinophils Relative: 4.3 %
HCT: 39.3 % (ref 38.5–50.0)
Hemoglobin: 13.1 g/dL — ABNORMAL LOW (ref 13.2–17.1)
Lymphs Abs: 2044 cells/uL (ref 850–3900)
MCH: 29.4 pg (ref 27.0–33.0)
MCHC: 33.3 g/dL (ref 32.0–36.0)
MCV: 88.1 fL (ref 80.0–100.0)
MPV: 9.9 fL (ref 7.5–12.5)
Monocytes Relative: 8.7 %
Neutro Abs: 3245 cells/uL (ref 1500–7800)
Neutrophils Relative %: 53.2 %
Platelets: 391 10*3/uL (ref 140–400)
RBC: 4.46 10*6/uL (ref 4.20–5.80)
RDW: 13.3 % (ref 11.0–15.0)
Total Lymphocyte: 33.5 %
WBC: 6.1 10*3/uL (ref 3.8–10.8)

## 2020-11-10 LAB — RPR: RPR Ser Ql: NONREACTIVE

## 2020-11-10 LAB — COMPLETE METABOLIC PANEL WITH GFR
AG Ratio: 1.4 (calc) (ref 1.0–2.5)
ALT: 15 U/L (ref 9–46)
AST: 16 U/L (ref 10–40)
Albumin: 4.2 g/dL (ref 3.6–5.1)
Alkaline phosphatase (APISO): 105 U/L (ref 36–130)
BUN: 10 mg/dL (ref 7–25)
CO2: 28 mmol/L (ref 20–32)
Calcium: 8.8 mg/dL (ref 8.6–10.3)
Chloride: 104 mmol/L (ref 98–110)
Creat: 1.07 mg/dL (ref 0.60–1.35)
GFR, Est African American: 98 mL/min/{1.73_m2} (ref >=60)
GFR, Est Non African American: 85 mL/min/{1.73_m2} (ref >=60)
Globulin: 3 g/dL (calc) (ref 1.9–3.7)
Glucose, Bld: 101 mg/dL — ABNORMAL HIGH (ref 65–99)
Potassium: 4 mmol/L (ref 3.5–5.3)
Sodium: 139 mmol/L (ref 135–146)
Total Bilirubin: 0.4 mg/dL (ref 0.2–1.2)
Total Protein: 7.2 g/dL (ref 6.1–8.1)

## 2020-11-10 LAB — LIPID PANEL
Cholesterol: 184 mg/dL (ref <200)
HDL: 42 mg/dL (ref >=40)
LDL Cholesterol (Calc): 113 mg/dL (calc) — ABNORMAL HIGH
Non-HDL Cholesterol (Calc): 142 mg/dL (calc) — ABNORMAL HIGH (ref <130)
Total CHOL/HDL Ratio: 4.4 (calc) (ref <5.0)
Triglycerides: 175 mg/dL — ABNORMAL HIGH (ref <150)

## 2020-11-10 LAB — CYTOLOGY, (ORAL, ANAL, URETHRAL) ANCILLARY ONLY
Chlamydia: NEGATIVE
Comment: NEGATIVE
Comment: NORMAL
Neisseria Gonorrhea: NEGATIVE

## 2020-11-10 LAB — HIV-1 RNA QUANT-NO REFLEX-BLD
HIV 1 RNA Quant: <20 Copies/mL
HIV-1 RNA Quant, Log: <1.3 Log cps/mL

## 2020-11-11 ENCOUNTER — Ambulatory Visit: Payer: Self-pay

## 2020-12-22 ENCOUNTER — Other Ambulatory Visit: Payer: Self-pay | Admitting: Internal Medicine

## 2020-12-22 DIAGNOSIS — K219 Gastro-esophageal reflux disease without esophagitis: Secondary | ICD-10-CM

## 2021-01-30 ENCOUNTER — Other Ambulatory Visit: Payer: Self-pay | Admitting: Nurse Practitioner

## 2021-01-30 ENCOUNTER — Telehealth: Payer: Self-pay

## 2021-01-30 DIAGNOSIS — L299 Pruritus, unspecified: Secondary | ICD-10-CM

## 2021-01-30 MED FILL — TRIAMCINOLONE 0.025% OINT: 0.025 | 7 days supply | Qty: 30 | Fill #0

## 2021-01-30 MED FILL — VALACYCLOVIR HCL 500 MG TAB: 500 | 30 days supply | Qty: 30 | Fill #0

## 2021-01-30 MED FILL — OMEPRAZOLE 20 MG CAP: 20 | 30 days supply | Qty: 60 | Fill #0

## 2021-01-30 MED FILL — CLINDAMYCIN PHOSPHATE 1 % G: 1 | 15 days supply | Qty: 30 | Fill #0

## 2021-01-30 NOTE — Telephone Encounter (Signed)
Patient called to review lab work drawn in December. Patient refusing MyChart at this time. Reports having some burning/itching with urination and previous sore testicles. States he has not had unprotected sex recently. Also states that he had a sore throat/lost his voice 2 times within 1 month. Patient instructed to contact his PCP for follow up. Patient verbalized understanding.   Emmogene Simson Loyola Mast, RN

## 2021-02-02 ENCOUNTER — Other Ambulatory Visit: Payer: Self-pay | Admitting: Nurse Practitioner

## 2021-02-02 MED FILL — CLOTRIMAZOLE-BETAMETHASONE: 1-0.05 | 15 days supply | Qty: 30 | Fill #0

## 2021-02-03 ENCOUNTER — Other Ambulatory Visit: Payer: Self-pay

## 2021-02-03 ENCOUNTER — Ambulatory Visit (INDEPENDENT_AMBULATORY_CARE_PROVIDER_SITE_OTHER): Payer: Self-pay | Admitting: Nurse Practitioner

## 2021-02-03 ENCOUNTER — Other Ambulatory Visit: Payer: Self-pay | Admitting: Dentistry

## 2021-02-03 ENCOUNTER — Other Ambulatory Visit: Payer: Self-pay | Admitting: Nurse Practitioner

## 2021-02-03 ENCOUNTER — Encounter: Payer: Self-pay | Admitting: Nurse Practitioner

## 2021-02-03 VITALS — BP 127/83 | HR 83 | Temp 99.1°F | Ht 73.0 in | Wt 265.0 lb

## 2021-02-03 DIAGNOSIS — L299 Pruritus, unspecified: Secondary | ICD-10-CM

## 2021-02-03 DIAGNOSIS — K219 Gastro-esophageal reflux disease without esophagitis: Secondary | ICD-10-CM

## 2021-02-03 DIAGNOSIS — R070 Pain in throat: Secondary | ICD-10-CM

## 2021-02-03 DIAGNOSIS — Z113 Encounter for screening for infections with a predominantly sexual mode of transmission: Secondary | ICD-10-CM

## 2021-02-03 MED ORDER — TRIAMCINOLONE ACETONIDE 0.025 % EX OINT: 1.0000 "application " | TOPICAL_OINTMENT | Freq: Two times a day (BID) | CUTANEOUS | 0 refills | Status: DC

## 2021-02-03 MED ORDER — HYDROXYZINE HCL 25 MG PO TABS: 25.0000 mg | ORAL_TABLET | Freq: Three times a day (TID) | ORAL | 6 refills | Status: DC | PRN

## 2021-02-03 MED ORDER — OMEPRAZOLE 20 MG PO CPDR: DELAYED_RELEASE_CAPSULE | ORAL | 3 refills | Status: DC

## 2021-02-03 MED ORDER — CLOTRIMAZOLE-BETAMETHASONE 1-0.05 % EX CREA: 1.0000 "application " | TOPICAL_CREAM | Freq: Two times a day (BID) | CUTANEOUS | 0 refills | Status: DC

## 2021-02-03 MED FILL — hydrOXYzine HCL 25 MG TABS: 25 | 30 days supply | Qty: 90 | Fill #0

## 2021-02-03 MED FILL — SF 5000 PLUS CREAM: 1.1 | 30 days supply | Qty: 51 | Fill #0

## 2021-02-03 NOTE — Progress Notes (Signed)
[facility] [place], [place]  [address] Phone:  [phone]   Fax:  [phone]   Acute Office Visit  Subjective:    Patient ID: Todd Carroll, male    DOB: [birth year], 45 y.o.   MRN: [id number]  Chief Complaint  Patient presents with  . Nasal Congestion    Covid neg test was week , sore throat, horness, icthy in ear  right, std testing , swab for throat     HPI Sore Throat Patient complains of sore throat. Associated symptoms include nasal blockage, post nasal drip, productive cough and sore throat. Onset of symptoms was 4 weeks ago, and have been unchanged since that time. He is drinking plenty of fluids. He has not had recent close exposure to someone with proven streptococcal pharyngitis. He did have a oral lesion a few weeks no injury/  Past Medical History:  Diagnosis Date  . ALLERGIC RHINITIS 08/27/2007  . Allergy   . GERD 05/04/2008    Past Surgical History:  Procedure Laterality Date  . APPENDECTOMY      Family History  Problem Relation Age of Onset  . Multiple myeloma Mother   . Diabetes Mother   . Colon cancer Neg Hx   . Esophageal cancer Neg Hx   . Stomach cancer Neg Hx   . Pancreatic cancer Neg Hx   . Liver disease Neg Hx   . Rectal cancer Neg Hx     Social History   Socioeconomic History  . Marital status: Single    Spouse name: Not on file  . Number of children: Not on file  . Years of education: Not on file  . Highest education level: Not on file  Occupational History  . Not on file  Tobacco Use  . Smoking status: Never Smoker  . Smokeless tobacco: Never Used  Vaping Use  . Vaping Use: Never used  Substance and Sexual Activity  . Alcohol use: Not Currently    Comment: social , liquor-- 1 drink every month to 2 months  . Drug use: No  . Sexual activity: Not Currently    Partners: Female, Male    Birth control/protection: Condom    Comment: pt given condoms  Other Topics Concern  . Not on file   Social History Narrative  . Not on file   Social Determinants of Health   Financial Resource Strain: Not on file  Food Insecurity: Not on file  Transportation Needs: Not on file  Physical Activity: Not on file  Stress: Not on file  Social Connections: Not on file  Intimate Partner Violence: Not on file    Outpatient Medications Prior to Visit  Medication Sig Dispense Refill  . bictegravir-emtricitabine-tenofovir AF (BIKTARVY) 50-200-25 MG TABS tablet Take 1 tablet by mouth daily. 30 tablet 5  . clindamycin (CLINDAGEL) 1 % gel Apply topically 2 (two) times daily. 30 g 0  . multivitamin (ONE-A-DAY MEN'S) TABS tablet Take 1 tablet by mouth daily. 100 tablet 0  . SF 5000 PLUS 1.1 % CREA dental cream Take by mouth daily.    . valACYclovir (VALTREX) 500 MG tablet TAKE 1 TABLET BY MOUTH DAILY 60 tablet 1  . hydrOXYzine (ATARAX/VISTARIL) 25 MG tablet TAKE 1 TABLET (25 MG TOTAL) BY MOUTH 3 (THREE) TIMES DAILY AS NEEDED FOR ITCHING. 90 tablet 6  . omeprazole (PRILOSEC) 20 MG capsule TAKE 2 CAPSULES BY MOUTH EVERY MORNING BEFORE EATING 60 capsule 1  . triamcinolone (KENALOG) 0.025 %  ointment APPLY 1 APPLICATION TOPICALLY 2 (TWO) TIMES DAILY. 30 g 0  . ciprofloxacin-dexamethasone (CIPRODEX) OTIC suspension Place 4 drops into the right ear 2 (two) times daily. (Patient not taking: No sig reported) 7.5 mL 0  . clotrimazole-betamethasone (LOTRISONE) cream APPLY 1 APPLICATION TOPICALLY 2 (TWO) TIMES DAILY. (Patient not taking: Reported on 02/03/2021) 30 g 0   No facility-administered medications prior to visit.    Allergies  Allergen Reactions  . Azithromycin Itching    Review of Systems  Genitourinary: Positive for dysuria and frequency.       Itch       Objective:    Physical Exam HENT:     Head: Normocephalic and atraumatic.     Nose: Nose normal.     Mouth/Throat:     Mouth: Mucous membranes are dry.  Cardiovascular:     Rate and Rhythm: Normal rate and regular rhythm.      Pulses: Normal pulses.     Heart sounds: Normal heart sounds.  Pulmonary:     Effort: Pulmonary effort is normal.     Breath sounds: Normal breath sounds.  Genitourinary:    Comments: deferred Musculoskeletal:     Cervical back: Normal range of motion.  Skin:    General: Skin is warm and dry.     Capillary Refill: Capillary refill takes less than 2 seconds.  Neurological:     General: No focal deficit present.     Mental Status: He is alert and oriented to person, place, and time.  Psychiatric:        Mood and Affect: Mood normal.        Behavior: Behavior normal.        Thought Content: Thought content normal.        Judgment: Judgment normal.     BP 127/83 (BP Location: Left Arm, Patient Position: Sitting, Cuff Size: Normal)   Pulse 83   Temp 99.1 F (37.3 C) (Temporal)   Ht 6' 1"  (1.854 m)   Wt 265 lb (120.2 kg)   SpO2 95%   BMI 34.96 kg/m  Wt Readings from Last 3 Encounters:  02/03/21 265 lb (120.2 kg)  11/08/20 266 lb (120.7 kg)  09/21/20 267 lb (121.1 kg)    There are no preventive care reminders to display for this patient.  There are no preventive care reminders to display for this patient.   Lab Results  Component Value Date   TSH 0.352 (L) 10/10/2018   Lab Results  Component Value Date   WBC 6.1 11/08/2020   HGB 13.1 (L) 11/08/2020   HCT 39.3 11/08/2020   MCV 88.1 11/08/2020   PLT 391 11/08/2020   Lab Results  Component Value Date   NA 139 11/08/2020   K 4.0 11/08/2020   CO2 28 11/08/2020   GLUCOSE 101 (H) 11/08/2020   BUN 10 11/08/2020   CREATININE 1.07 11/08/2020   BILITOT 0.4 11/08/2020   ALKPHOS 127 (H) 09/21/2020   AST 16 11/08/2020   ALT 15 11/08/2020   PROT 7.2 11/08/2020   ALBUMIN 4.4 09/21/2020   CALCIUM 8.8 11/08/2020   Lab Results  Component Value Date   CHOL 184 11/08/2020   Lab Results  Component Value Date   HDL 42 11/08/2020   Lab Results  Component Value Date   LDLCALC 113 (H) 11/08/2020   Lab Results   Component Value Date   TRIG 175 (H) 11/08/2020   Lab Results  Component Value Date   CHOLHDL 4.4 11/08/2020  Lab Results  Component Value Date   HGBA1C 5.9 (A) 04/15/2020   HGBA1C 5.9 04/15/2020   HGBA1C 5.9 04/15/2020   HGBA1C 5.9 04/15/2020       Assessment & Plan:   Problem List Items Addressed This Visit      Other   Itch   Relevant Medications   triamcinolone (KENALOG) 0.025 % ointment    Other Visit Diagnoses    Screening for STD (sexually transmitted disease)    -  Primary   Relevant Orders   STD Screen (8) (Completed)   Chlamydia/Gonococcus/Trichomonas, NAA (Completed)   Pruritus       Relevant Medications   hydrOXYzine (ATARAX/VISTARIL) 25 MG tablet   Gastroesophageal reflux disease       Relevant Medications   omeprazole (PRILOSEC) 20 MG capsule   Throat pain       Relevant Orders   Strep Complete Panel       Meds ordered this encounter  Medications  . hydrOXYzine (ATARAX/VISTARIL) 25 MG tablet    Sig: Take 1 tablet (25 mg total) by mouth 3 (three) times daily as needed for itching.    Dispense:  90 tablet    Refill:  6    Order Specific Question:   Supervising Provider    Answer:   Tresa Garter [id number]  . omeprazole (PRILOSEC) 20 MG capsule    Sig: TAKE 2 CAPSULES BY MOUTH EVERY MORNING BEFORE EATING    Dispense:  90 capsule    Refill:  3    Order Specific Question:   Supervising Provider    Answer:   Tresa Garter [id number]  . clotrimazole-betamethasone (LOTRISONE) cream    Sig: Apply 1 application topically 2 (two) times daily.    Dispense:  30 g    Refill:  0    Order Specific Question:   Supervising Provider    Answer:   Tresa Garter [id number]  . triamcinolone (KENALOG) 0.025 % ointment    Sig: Apply 1 application topically 2 (two) times daily.    Dispense:  30 g    Refill:  0    Please have patient contact [facility] to schedule 3 month follow up visit ASAP. [phone]    Order Specific Question:   Supervising  Provider    Answer:   Tresa Garter [[id number]]     Vevelyn Francois, NP

## 2021-02-05 ENCOUNTER — Other Ambulatory Visit: Payer: Self-pay | Admitting: Nurse Practitioner

## 2021-02-05 LAB — CHLAMYDIA/GONOCOCCUS/TRICHOMONAS, NAA
Chlamydia by NAA: NEGATIVE
Gonococcus by NAA: NEGATIVE
Trich vag by NAA: NEGATIVE

## 2021-02-06 ENCOUNTER — Other Ambulatory Visit: Payer: Self-pay | Admitting: Nurse Practitioner

## 2021-02-06 ENCOUNTER — Encounter: Payer: Self-pay | Admitting: Nurse Practitioner

## 2021-02-06 ENCOUNTER — Telehealth: Payer: Self-pay

## 2021-02-06 MED ORDER — EAR DROPS 6.5 % OT SOLN: 5.0000 [drp] | Freq: Two times a day (BID) | OTIC | 0 refills | Status: DC

## 2021-02-06 NOTE — Telephone Encounter (Signed)
sent 

## 2021-02-06 NOTE — Telephone Encounter (Signed)
Please see refill request.

## 2021-02-06 NOTE — Telephone Encounter (Signed)
Ear drops is needed per pt.

## 2021-02-07 LAB — STD SCREEN (8)
HIV Screen 4th Generation wRfx: REACTIVE
HSV 1 Glycoprotein G Ab, IgG: 10.6 index — ABNORMAL HIGH (ref 0.00–0.90)
HSV 2 IgG, Type Spec: 12.2 index — ABNORMAL HIGH (ref 0.00–0.90)
Hep A IgM: NEGATIVE
Hep B C IgM: NEGATIVE
Hep C Virus Ab: 0.1 s/co ratio (ref 0.0–0.9)
Hepatitis B Surface Ag: NEGATIVE
RPR Ser Ql: NONREACTIVE

## 2021-02-07 LAB — HIV 1/2 AB DIFFERENTIATION
HIV 1 Ab: REACTIVE
HIV 2 Ab: NONREACTIVE
NOTE (HIV CONF MULTIP: POSITIVE — AB

## 2021-02-07 MED FILL — CIPROFLOXACIN-DEXAMETHASONE: 0.3-0.1 | 10 days supply | Qty: 8 | Fill #0

## 2021-03-20 ENCOUNTER — Other Ambulatory Visit: Payer: Self-pay | Admitting: Internal Medicine

## 2021-03-20 ENCOUNTER — Other Ambulatory Visit: Payer: Self-pay | Admitting: Pharmacist

## 2021-03-20 DIAGNOSIS — B20 Human immunodeficiency virus [HIV] disease: Secondary | ICD-10-CM

## 2021-03-20 DIAGNOSIS — A6001 Herpesviral infection of penis: Secondary | ICD-10-CM

## 2021-03-20 DIAGNOSIS — K219 Gastro-esophageal reflux disease without esophagitis: Secondary | ICD-10-CM

## 2021-03-22 ENCOUNTER — Other Ambulatory Visit: Payer: Self-pay | Admitting: Internal Medicine

## 2021-03-22 DIAGNOSIS — K219 Gastro-esophageal reflux disease without esophagitis: Secondary | ICD-10-CM

## 2021-03-24 ENCOUNTER — Encounter: Payer: Self-pay | Admitting: Internal Medicine

## 2021-04-17 ENCOUNTER — Other Ambulatory Visit: Payer: Self-pay | Admitting: Internal Medicine

## 2021-04-17 DIAGNOSIS — K219 Gastro-esophageal reflux disease without esophagitis: Secondary | ICD-10-CM

## 2021-04-30 ENCOUNTER — Other Ambulatory Visit: Payer: Self-pay | Admitting: Internal Medicine

## 2021-04-30 DIAGNOSIS — K219 Gastro-esophageal reflux disease without esophagitis: Secondary | ICD-10-CM

## 2021-05-03 ENCOUNTER — Other Ambulatory Visit: Payer: Self-pay

## 2021-05-03 DIAGNOSIS — K219 Gastro-esophageal reflux disease without esophagitis: Secondary | ICD-10-CM

## 2021-05-03 MED ORDER — OMEPRAZOLE 20 MG PO CPDR: DELAYED_RELEASE_CAPSULE | ORAL | 0 refills | Status: DC

## 2021-05-15 ENCOUNTER — Ambulatory Visit: Payer: Self-pay | Admitting: Internal Medicine

## 2021-05-15 ENCOUNTER — Other Ambulatory Visit: Payer: Self-pay | Admitting: Family

## 2021-05-15 DIAGNOSIS — B20 Human immunodeficiency virus [HIV] disease: Secondary | ICD-10-CM

## 2021-05-18 ENCOUNTER — Other Ambulatory Visit: Payer: Self-pay

## 2021-05-18 MED FILL — Sodium Fluoride Cream 1.1%: DENTAL | 10 days supply | Qty: 51 | Fill #0 | Status: AC

## 2021-05-18 MED FILL — Omeprazole Cap Delayed Release 20 MG: ORAL | 30 days supply | Qty: 60 | Fill #0 | Status: AC

## 2021-05-18 MED FILL — Hydroxyzine HCl Tab 25 MG: ORAL | 30 days supply | Qty: 90 | Fill #0 | Status: AC

## 2021-05-18 MED FILL — Triamcinolone Acetonide Oint 0.025%: CUTANEOUS | 15 days supply | Qty: 30 | Fill #0 | Status: AC

## 2021-05-18 MED FILL — Clotrimazole w/ Betamethasone Cream 1-0.05%: CUTANEOUS | 15 days supply | Qty: 30 | Fill #0 | Status: AC

## 2021-05-19 ENCOUNTER — Other Ambulatory Visit: Payer: Self-pay | Admitting: Internal Medicine

## 2021-05-19 ENCOUNTER — Other Ambulatory Visit: Payer: Self-pay

## 2021-05-21 ENCOUNTER — Other Ambulatory Visit: Payer: Self-pay | Admitting: Internal Medicine

## 2021-05-21 DIAGNOSIS — B20 Human immunodeficiency virus [HIV] disease: Secondary | ICD-10-CM

## 2021-05-22 ENCOUNTER — Ambulatory Visit: Payer: Self-pay | Admitting: Internal Medicine

## 2021-05-22 ENCOUNTER — Other Ambulatory Visit: Payer: Self-pay

## 2021-05-22 NOTE — Telephone Encounter (Signed)
Called patient to reschedule no show appointment, phone number is not in service and patient does not have MyChart. Will send in 30 day supply of Biktarvy.   Sandie Ano, RN

## 2021-06-02 ENCOUNTER — Telehealth: Payer: Self-pay

## 2021-06-02 ENCOUNTER — Other Ambulatory Visit: Payer: Self-pay

## 2021-06-02 ENCOUNTER — Ambulatory Visit: Payer: Self-pay

## 2021-06-02 ENCOUNTER — Encounter: Payer: Self-pay | Admitting: Internal Medicine

## 2021-06-02 NOTE — Telephone Encounter (Signed)
Patient here to renew financial, states he was supposed to pick up some "extra medicine" about 1 month ago. Advised him that refill of Biktarvy was sent to [facility] on 05/22/21 and that he should be able to pick up a 30 day supply there. Patient verbalized understanding and has no further questions.   Sandie Ano, RN

## 2021-06-11 ENCOUNTER — Other Ambulatory Visit: Payer: Self-pay | Admitting: Internal Medicine

## 2021-06-11 DIAGNOSIS — K219 Gastro-esophageal reflux disease without esophagitis: Secondary | ICD-10-CM

## 2021-06-12 ENCOUNTER — Ambulatory Visit: Payer: Self-pay | Admitting: Nurse Practitioner

## 2021-06-12 NOTE — Telephone Encounter (Signed)
Appt 7/13

## 2021-06-14 ENCOUNTER — Ambulatory Visit: Payer: Self-pay | Admitting: Internal Medicine

## 2021-06-19 ENCOUNTER — Other Ambulatory Visit: Payer: Self-pay | Admitting: Internal Medicine

## 2021-06-19 DIAGNOSIS — B20 Human immunodeficiency virus [HIV] disease: Secondary | ICD-10-CM

## 2021-07-03 ENCOUNTER — Other Ambulatory Visit: Payer: Self-pay

## 2021-07-03 NOTE — Telephone Encounter (Signed)
Error

## 2021-07-11 ENCOUNTER — Other Ambulatory Visit: Payer: Self-pay | Admitting: Internal Medicine

## 2021-07-11 DIAGNOSIS — K219 Gastro-esophageal reflux disease without esophagitis: Secondary | ICD-10-CM

## 2021-07-12 ENCOUNTER — Telehealth: Payer: Self-pay

## 2021-07-12 DIAGNOSIS — K219 Gastro-esophageal reflux disease without esophagitis: Secondary | ICD-10-CM

## 2021-07-12 DIAGNOSIS — L299 Pruritus, unspecified: Secondary | ICD-10-CM

## 2021-07-12 MED ORDER — OMEPRAZOLE 20 MG PO CPDR: DELAYED_RELEASE_CAPSULE | ORAL | 3 refills | Status: DC

## 2021-07-12 MED ORDER — HYDROXYZINE HCL 25 MG PO TABS: 25.0000 mg | ORAL_TABLET | Freq: Three times a day (TID) | ORAL | 6 refills | Status: DC | PRN

## 2021-07-12 NOTE — Addendum Note (Signed)
Addended by: Eduard Clos on: 07/12/2021 10:57 AM   Modules accepted: Orders

## 2021-07-12 NOTE — Telephone Encounter (Signed)
Refilled

## 2021-07-12 NOTE — Telephone Encounter (Signed)
Hydrozyzine (white pill) NOT green pills  Omeprazole   [facility] on [address]

## 2021-07-14 ENCOUNTER — Other Ambulatory Visit: Payer: Self-pay | Admitting: Internal Medicine

## 2021-07-14 DIAGNOSIS — B20 Human immunodeficiency virus [HIV] disease: Secondary | ICD-10-CM

## 2021-07-28 ENCOUNTER — Other Ambulatory Visit: Payer: Self-pay | Admitting: Nurse Practitioner

## 2021-07-28 ENCOUNTER — Other Ambulatory Visit: Payer: Self-pay

## 2021-07-28 DIAGNOSIS — L739 Follicular disorder, unspecified: Secondary | ICD-10-CM

## 2021-07-28 MED ORDER — CIPROFLOXACIN-DEXAMETHASONE 0.3-0.1 % OT SUSP
4.0000 [drp] | Freq: Two times a day (BID) | OTIC | 0 refills | Status: DC
  Filled 2021-07-28 – 2021-08-09 (×2): qty 7.5, 15d supply, fill #0

## 2021-07-28 MED FILL — Sodium Fluoride Cream 1.1%: DENTAL | 10 days supply | Qty: 51 | Fill #1 | Status: CN

## 2021-07-31 ENCOUNTER — Other Ambulatory Visit: Payer: Self-pay

## 2021-08-01 ENCOUNTER — Ambulatory Visit: Payer: Self-pay | Admitting: Internal Medicine

## 2021-08-03 ENCOUNTER — Other Ambulatory Visit: Payer: Self-pay

## 2021-08-03 ENCOUNTER — Encounter: Payer: Self-pay | Admitting: Internal Medicine

## 2021-08-03 ENCOUNTER — Ambulatory Visit (INDEPENDENT_AMBULATORY_CARE_PROVIDER_SITE_OTHER): Payer: Self-pay | Admitting: Internal Medicine

## 2021-08-03 VITALS — BP 118/84 | HR 99 | Temp 98.5°F | Wt 272.0 lb

## 2021-08-03 DIAGNOSIS — L739 Follicular disorder, unspecified: Secondary | ICD-10-CM

## 2021-08-03 DIAGNOSIS — B20 Human immunodeficiency virus [HIV] disease: Secondary | ICD-10-CM

## 2021-08-03 DIAGNOSIS — R300 Dysuria: Secondary | ICD-10-CM

## 2021-08-03 DIAGNOSIS — Z23 Encounter for immunization: Secondary | ICD-10-CM

## 2021-08-03 NOTE — Progress Notes (Signed)
    [facility]  Name:  Todd Carroll    MRN: [id number] DOB: 11/03/1976   08/03/2021  Todd Carroll was observed post JYNNEOS immunization for 15 minutes without incident. He was provided with Vaccine Information Sheet and instruction to access the V-Safe system.   Todd Carroll was instructed to call 911 with any severe reactions post vaccine: Difficulty breathing  Swelling of face and throat  A fast heartbeat  A bad rash all over body  Dizziness and weakness    Juanita Laster, RMA

## 2021-08-03 NOTE — Assessment & Plan Note (Signed)
He asked about refills of his clindamycin.  I do not see any evidence of ongoing folliculitis at this time do not feel that he needs a refill currently.

## 2021-08-03 NOTE — Assessment & Plan Note (Signed)
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  He will get repeat blood work today and continue [employer].  I encouraged him to be very careful about limiting the number of partners he has.

## 2021-08-03 NOTE — Assessment & Plan Note (Addendum)
I will obtain urine and blood work and all site swabs to test for possible urinary tract infection and a/or STDs.  He received his first dose of monkey pox vaccine today.

## 2021-08-03 NOTE — Addendum Note (Signed)
Addended by: Juanita Laster on: 08/03/2021 10:32 AM   Modules accepted: Orders

## 2021-08-03 NOTE — Progress Notes (Signed)
Patient Active Problem List   Diagnosis Date Noted   Human immunodeficiency virus (HIV) disease ([place]) 02/27/2013    Priority: High   Streptococcus A carrier or suspected carrier 07/24/2019   Underinsured 07/24/2019   Dysphagia 10/23/2018   Constipation 10/23/2018   Hematospermia 11/01/2014   Scalp pruritus 07/25/2014   Itch 05/25/2014   Rash and nonspecific skin eruption 47/06/6150   Folliculitis 83/43/7357   Pain, dental 07/13/2013   Dysuria 04/29/2013   GERD 05/04/2008   ALLERGIC RHINITIS 08/27/2007    Patient's Medications  New Prescriptions   No medications on file  Previous Medications   BIKTARVY 50-200-25 MG TABS TABLET    TAKE 1 TABLET BY MOUTH DAILY   CARBAMIDE PEROXIDE (DEBROX) 6.5 % OTIC SOLUTION    PLACE 5 DROPS INTO BOTH EARS 2 (TWO) TIMES DAILY.   CARBAMIDE PEROXIDE (EAR DROPS) 6.5 % OTIC SOLUTION    Place 5 drops into both ears 2 (two) times daily.   CIPROFLOXACIN-DEXAMETHASONE (CIPRODEX) OTIC SUSPENSION    PLACE 4 DROPS INTO THE RIGHT EAR 2 (TWO) TIMES DAILY.   CIPROFLOXACIN-DEXAMETHASONE (CIPRODEX) OTIC SUSPENSION    PLACE 4 DROPS INTO THE RIGHT EAR 2 (TWO) TIMES DAILY.   CIPROFLOXACIN-DEXAMETHASONE (CIPRODEX) OTIC SUSPENSION    PLACE 4 DROPS INTO THE RIGHT EAR 2 (TWO) TIMES DAILY.   CLINDAMYCIN (CLINDAGEL) 1 % GEL    Apply topically 2 (two) times daily.   CLOTRIMAZOLE-BETAMETHASONE (LOTRISONE) CREAM    Apply 1 application topically 2 (two) times daily.   HYDROXYZINE (ATARAX/VISTARIL) 25 MG TABLET    Take 1 tablet (25 mg total) by mouth 3 (three) times daily as needed for itching.   MULTIVITAMIN (ONE-A-DAY MEN'S) TABS TABLET    Take 1 tablet by mouth daily.   OMEPRAZOLE (PRILOSEC) 20 MG CAPSULE    TAKE 2 CAPSULES BY MOUTH EVERY MORNING BEFORE EATING   SF 5000 PLUS 1.1 % CREA DENTAL CREAM    Take by mouth daily.   SODIUM FLUORIDE (PREVIDENT 5000 PLUS) 1.1 % CREA DENTAL CREAM    BRUSH ON TEETH AS NEEDED.   TRIAMCINOLONE (KENALOG) 0.025 % OINTMENT     Apply 1 application topically 2 (two) times daily.   TRIAMCINOLONE (KENALOG) 0.025 % OINTMENT    APPLY 1 APPLICATION TOPICALLY 2 (TWO) TIMES DAILY.   TRIAMCINOLONE (KENALOG) 0.025 % OINTMENT    APPLY 1 APPLICATION TOPICALLY 2 (TWO) TIMES DAILY.   VALACYCLOVIR (VALTREX) 500 MG TABLET    TAKE 1 TABLET BY MOUTH DAILY  Modified Medications   No medications on file  Discontinued Medications   No medications on file    Subjective: Todd Carroll is seen on a work in basis.  He is followed by my partner, Dr. Carlyle Basques, for HIV infection.  He denies any problems obtaining, taking or tolerating his Biktarvy and does not recall missing any doses.Marland Kitchen  He tells me that he had sex with a new male partner 1 month ago.  His condom broke.  Several days later he started having dysuria and urinary frequency.  He has also had some mild abdominal pain and low back pain.  He asked if he should get a monkeypox vaccine.  Review of Systems: Review of Systems  Constitutional:  Negative for chills, diaphoresis, fever and weight loss.  Respiratory:  Negative for cough.   Cardiovascular:  Negative for chest pain.  Gastrointestinal:  Positive for abdominal pain and diarrhea. Negative for blood in stool, nausea and vomiting.  Genitourinary:  Positive for dysuria and frequency. Negative for hematuria and urgency.  Musculoskeletal:  Positive for back pain.  Skin:  Negative for rash.   Past Medical History:  Diagnosis Date   ALLERGIC RHINITIS 08/27/2007   Allergy    GERD 05/04/2008    Social History   Tobacco Use   Smoking status: Never   Smokeless tobacco: Never  Vaping Use   Vaping Use: Never used  Substance Use Topics   Alcohol use: Not Currently    Comment: social , liquor-- 1 drink every month to 2 months   Drug use: No    Family History  Problem Relation Age of Onset   Multiple myeloma Mother    Diabetes Mother    Colon cancer Neg Hx    Esophageal cancer Neg Hx    Stomach cancer Neg Hx    Pancreatic  cancer Neg Hx    Liver disease Neg Hx    Rectal cancer Neg Hx     Allergies  Allergen Reactions   Azithromycin Itching    Health Maintenance  Topic Date Due   COVID-19 Vaccine (4 - Booster for Pfizer series) 02/06/2021   TETANUS/TDAP  07/25/2027   Pneumococcal Vaccine 51-71 Years old (38 - PPSV23 or PCV20) 12/15/2041   Hepatitis C Screening  Completed   HIV Screening  Completed   HPV VACCINES  Aged Out    Objective:  Vitals:   08/03/21 0945  BP: 118/84  Pulse: 99  Temp: 98.5 F (36.9 C)  TempSrc: Oral  SpO2: 99%  Weight: 272 lb (123.4 kg)   Body mass index is 35.89 kg/m.  Physical Exam Constitutional:      General: He is not in acute distress.    Appearance: He is not ill-appearing.  Cardiovascular:     Rate and Rhythm: Normal rate and regular rhythm.     Heart sounds: No murmur heard. Pulmonary:     Effort: Pulmonary effort is normal.     Breath sounds: Normal breath sounds.  Abdominal:     Palpations: Abdomen is soft.     Tenderness: There is no abdominal tenderness. There is no right CVA tenderness or left CVA tenderness.  Skin:    Findings: No rash.  Neurological:     Mental Status: He is alert.  Psychiatric:        Mood and Affect: Mood normal.    Lab Results Lab Results  Component Value Date   WBC 6.1 11/08/2020   HGB 13.1 (L) 11/08/2020   HCT 39.3 11/08/2020   MCV 88.1 11/08/2020   PLT 391 11/08/2020    Lab Results  Component Value Date   CREATININE 1.07 11/08/2020   BUN 10 11/08/2020   NA 139 11/08/2020   K 4.0 11/08/2020   CL 104 11/08/2020   CO2 28 11/08/2020    Lab Results  Component Value Date   ALT 15 11/08/2020   AST 16 11/08/2020   ALKPHOS 127 (H) 09/21/2020   BILITOT 0.4 11/08/2020    Lab Results  Component Value Date   CHOL 184 11/08/2020   HDL 42 11/08/2020   LDLCALC 113 (H) 11/08/2020   TRIG 175 (H) 11/08/2020   CHOLHDL 4.4 11/08/2020   Lab Results  Component Value Date   LABRPR Non Reactive 02/03/2021   HIV  1 RNA Quant  Date Value  11/08/2020 <20 Copies/mL  04/18/2020 <20 NOT DETECTED copies/mL  12/28/2019 <20 NOT DETECTED copies/mL   CD4 T Cell Abs (/uL)  Date Value  11/08/2020  817  12/28/2019 914  07/22/2019 942     Problem List Items Addressed This Visit       High   Human immunodeficiency virus (HIV) disease (Todd Carroll)    His infection has been under excellent, long-term control.  He will get repeat blood work today and continue [employer].  I encouraged him to be very careful about limiting the number of partners he has.      Relevant Orders   RPR     Unprioritized   Dysuria    I will obtain urine and blood work and all site swabs to test for possible urinary tract infection and a/or STDs.  He received his first dose of monkey pox vaccine today.      Relevant Orders   Urinalysis, Routine w reflex microscopic   Urine Culture   Urine cytology ancillary only   Cytology (oral, anal, urethral) ancillary only   Cytology (oral, anal, urethral) ancillary only   T-helper cell (CD4)- ([facility] only)   HIV-1 RNA quant-no reflex-bld   Folliculitis    He asked about refills of his clindamycin.  I do not see any evidence of ongoing folliculitis at this time do not feel that he needs a refill currently.         Michel Bickers, MD [facility] [place] [phone] pager   [phone] cell 08/03/2021, 10:17 AM

## 2021-08-04 ENCOUNTER — Telehealth: Payer: Self-pay

## 2021-08-04 ENCOUNTER — Other Ambulatory Visit: Payer: Self-pay

## 2021-08-04 LAB — CYTOLOGY, (ORAL, ANAL, URETHRAL) ANCILLARY ONLY
Chlamydia: NEGATIVE
Chlamydia: NEGATIVE
Comment: NEGATIVE
Comment: NEGATIVE
Comment: NORMAL
Comment: NORMAL
Neisseria Gonorrhea: NEGATIVE
Neisseria Gonorrhea: NEGATIVE

## 2021-08-04 LAB — T-HELPER CELL (CD4) - (RCID CLINIC ONLY)
CD4 % Helper T Cell: 44 % (ref 33–65)
CD4 T Cell Abs: 1210 /uL (ref 400–1790)

## 2021-08-04 LAB — URINE CYTOLOGY ANCILLARY ONLY
Chlamydia: NEGATIVE
Comment: NEGATIVE
Comment: NORMAL
Neisseria Gonorrhea: NEGATIVE

## 2021-08-04 NOTE — Telephone Encounter (Signed)
Patient called wanting to go over lab results, relayed that he is negative for gonorrhea, chlamydia, and syphilis and that his CD4 count is excellent at over 1200.   Relayed that urine tests are not back yet. He states he will call on Tuesday to ask about those tests as he does not have a phone.   Sandie Ano, RN

## 2021-08-05 ENCOUNTER — Other Ambulatory Visit: Payer: Self-pay | Admitting: Internal Medicine

## 2021-08-05 DIAGNOSIS — B20 Human immunodeficiency virus [HIV] disease: Secondary | ICD-10-CM

## 2021-08-06 LAB — URINALYSIS, ROUTINE W REFLEX MICROSCOPIC
Bilirubin Urine: NEGATIVE
Glucose, UA: NEGATIVE
Hgb urine dipstick: NEGATIVE
Ketones, ur: NEGATIVE
Leukocytes,Ua: NEGATIVE
Nitrite: NEGATIVE
Protein, ur: NEGATIVE
Specific Gravity, Urine: 1.026 (ref 1.001–1.035)
pH: 6 (ref 5.0–8.0)

## 2021-08-06 LAB — URINE CULTURE
MICRO NUMBER:: 12322435
Result:: NO GROWTH
SPECIMEN QUALITY:: ADEQUATE

## 2021-08-06 LAB — RPR: RPR Ser Ql: NONREACTIVE

## 2021-08-06 LAB — HIV-1 RNA QUANT-NO REFLEX-BLD
HIV 1 RNA Quant: NOT DETECTED Copies/mL
HIV-1 RNA Quant, Log: NOT DETECTED Log cps/mL

## 2021-08-08 ENCOUNTER — Ambulatory Visit: Payer: Self-pay | Admitting: Internal Medicine

## 2021-08-09 ENCOUNTER — Telehealth: Payer: Self-pay

## 2021-08-09 ENCOUNTER — Other Ambulatory Visit: Payer: Self-pay | Admitting: Nurse Practitioner

## 2021-08-09 ENCOUNTER — Other Ambulatory Visit: Payer: Self-pay | Admitting: Internal Medicine

## 2021-08-09 ENCOUNTER — Other Ambulatory Visit: Payer: Self-pay

## 2021-08-09 DIAGNOSIS — L739 Follicular disorder, unspecified: Secondary | ICD-10-CM

## 2021-08-09 DIAGNOSIS — L299 Pruritus, unspecified: Secondary | ICD-10-CM

## 2021-08-09 MED ORDER — OMEPRAZOLE 20 MG PO CPDR
DELAYED_RELEASE_CAPSULE | ORAL | 3 refills | Status: DC
  Filled 2021-08-09: qty 60, 30d supply, fill #0
  Filled 2021-08-17: qty 30, 30d supply, fill #0
  Filled 2021-10-05: qty 60, 30d supply, fill #1
  Filled 2022-02-09: qty 180, 90d supply, fill #0

## 2021-08-09 MED ORDER — TRIAMCINOLONE ACETONIDE 0.025 % EX OINT
TOPICAL_OINTMENT | Freq: Two times a day (BID) | CUTANEOUS | 0 refills | Status: DC
  Filled 2021-08-09: qty 30, 30d supply, fill #0
  Filled 2021-10-05: qty 30, 15d supply, fill #0

## 2021-08-09 MED ORDER — TRIAMCINOLONE ACETONIDE 0.025 % EX OINT
1.0000 | TOPICAL_OINTMENT | Freq: Two times a day (BID) | CUTANEOUS | 0 refills | Status: DC
Start: 2021-08-09 — End: 2021-11-17
  Filled 2021-08-09 – 2021-08-17 (×2): qty 30, 15d supply, fill #0

## 2021-08-09 MED ORDER — CLINDAMYCIN PHOSPHATE 1 % EX GEL
Freq: Two times a day (BID) | CUTANEOUS | 0 refills | Status: DC
  Filled 2021-08-09: qty 30, 30d supply, fill #0
  Filled 2021-08-17: qty 30, 15d supply, fill #0

## 2021-08-09 MED ORDER — CIPROFLOXACIN-DEXAMETHASONE 0.3-0.1 % OT SUSP
OTIC | 0 refills | Status: DC
  Filled 2021-08-09: qty 7.5, 15d supply, fill #0
  Filled 2021-08-17: qty 7.5, 14d supply, fill #0

## 2021-08-09 MED FILL — Omeprazole Cap Delayed Release 20 MG: ORAL | 30 days supply | Qty: 60 | Fill #1 | Status: CN

## 2021-08-09 MED FILL — Sodium Fluoride Cream 1.1%: DENTAL | 10 days supply | Qty: 51 | Fill #1 | Status: CN

## 2021-08-09 NOTE — Telephone Encounter (Signed)
Patient accepts appointment for tomorrow 08/10/21.  Sandie Ano, RN

## 2021-08-09 NOTE — Telephone Encounter (Signed)
Patient called requesting to go over lab results. RN relayed that all tests done at his last visit on 08/03/21 came back within normal limits. Patient is asking about kidney function tests, relayed that is does not appear a metabolic panel was drawn. He states he is still having urinary frequency and burning sensation. He also complains of "a little" rectal pain. He says he's not sure that he performed the rectal swab correctly. He explained how he performed the swab, it sounds as though he did it correctly.   Relayed that urinalysis, urine culture, and swabs for gonorrhea/chlamydia did not show any abnormalities. He states he does not have access to a phone consistently and will call back this afternoon.   Sandie Ano, RN

## 2021-08-09 NOTE — Telephone Encounter (Signed)
Clindamycin  Triamcinolone  Omeprazole  Ear drops med

## 2021-08-10 ENCOUNTER — Ambulatory Visit (INDEPENDENT_AMBULATORY_CARE_PROVIDER_SITE_OTHER): Payer: Self-pay | Admitting: Internal Medicine

## 2021-08-10 ENCOUNTER — Other Ambulatory Visit: Payer: Self-pay

## 2021-08-10 DIAGNOSIS — B20 Human immunodeficiency virus [HIV] disease: Secondary | ICD-10-CM

## 2021-08-10 DIAGNOSIS — R300 Dysuria: Secondary | ICD-10-CM

## 2021-08-10 DIAGNOSIS — R197 Diarrhea, unspecified: Secondary | ICD-10-CM | POA: Insufficient documentation

## 2021-08-10 LAB — CBC
HCT: 40.9 % (ref 38.5–50.0)
Hemoglobin: 13.4 g/dL (ref 13.2–17.1)
MCH: 29.8 pg (ref 27.0–33.0)
MCHC: 32.8 g/dL (ref 32.0–36.0)
MCV: 90.9 fL (ref 80.0–100.0)
MPV: 9.8 fL (ref 7.5–12.5)
Platelets: 388 10*3/uL (ref 140–400)
RBC: 4.5 10*6/uL (ref 4.20–5.80)
RDW: 13.8 % (ref 11.0–15.0)
WBC: 7.3 10*3/uL (ref 3.8–10.8)

## 2021-08-10 LAB — COMPREHENSIVE METABOLIC PANEL
AG Ratio: 1.4 (calc) (ref 1.0–2.5)
ALT: 17 U/L (ref 9–46)
AST: 19 U/L (ref 10–40)
Albumin: 4.5 g/dL (ref 3.6–5.1)
Alkaline phosphatase (APISO): 87 U/L (ref 36–130)
BUN: 14 mg/dL (ref 7–25)
CO2: 27 mmol/L (ref 20–32)
Calcium: 9.6 mg/dL (ref 8.6–10.3)
Chloride: 103 mmol/L (ref 98–110)
Creat: 1.12 mg/dL (ref 0.60–1.29)
Globulin: 3.2 g/dL (calc) (ref 1.9–3.7)
Glucose, Bld: 97 mg/dL (ref 65–99)
Potassium: 4.4 mmol/L (ref 3.5–5.3)
Sodium: 139 mmol/L (ref 135–146)
Total Bilirubin: 0.2 mg/dL (ref 0.2–1.2)
Total Protein: 7.7 g/dL (ref 6.1–8.1)

## 2021-08-10 NOTE — Progress Notes (Signed)
[facility]  Patient Active Problem List   Diagnosis Date Noted   Human immunodeficiency virus (HIV) disease ([place]) 02/27/2013    Priority: High   Diarrhea 08/10/2021   Streptococcus A carrier or suspected carrier 07/24/2019   Underinsured 07/24/2019   Dysphagia 10/23/2018   Constipation 10/23/2018   Hematospermia 11/01/2014   Scalp pruritus 07/25/2014   Itch 05/25/2014   Rash and nonspecific skin eruption 29/51/8841   Folliculitis 66/05/3015   Pain, dental 07/13/2013   Dysuria 04/29/2013   GERD 05/04/2008   ALLERGIC RHINITIS 08/27/2007    Patient's Medications  New Prescriptions   No medications on file  Previous Medications   BIKTARVY 50-200-25 MG TABS TABLET    TAKE 1 TABLET BY MOUTH DAILY   CARBAMIDE PEROXIDE (DEBROX) 6.5 % OTIC SOLUTION    PLACE 5 DROPS INTO BOTH EARS 2 (TWO) TIMES DAILY.   CARBAMIDE PEROXIDE (EAR DROPS) 6.5 % OTIC SOLUTION    Place 5 drops into both ears 2 (two) times daily.   CIPROFLOXACIN-DEXAMETHASONE (CIPRODEX) OTIC SUSPENSION    PLACE 4 DROPS INTO THE RIGHT EAR 2 (TWO) TIMES DAILY.   CLINDAMYCIN (CLINDAGEL) 1 % GEL    Apply topically 2 (two) times daily.   CLOTRIMAZOLE-BETAMETHASONE (LOTRISONE) CREAM    Apply 1 application topically 2 (two) times daily.   HYDROXYZINE (ATARAX/VISTARIL) 25 MG TABLET    Take 1 tablet (25 mg total) by mouth 3 (three) times daily as needed for itching.   MULTIVITAMIN (ONE-A-DAY MEN'S) TABS TABLET    Take 1 tablet by mouth daily.   OMEPRAZOLE (PRILOSEC) 20 MG CAPSULE    TAKE 2 CAPSULES BY MOUTH EVERY MORNING BEFORE EATING   SF 5000 PLUS 1.1 % CREA DENTAL CREAM    Take by mouth daily.   SODIUM FLUORIDE (PREVIDENT 5000 PLUS) 1.1 % CREA DENTAL CREAM    BRUSH ON TEETH AS NEEDED.   TRIAMCINOLONE (KENALOG) 0.025 % OINTMENT    APPLY 1 APPLICATION TOPICALLY 2 (TWO) TIMES DAILY.   TRIAMCINOLONE (KENALOG) 0.025 % OINTMENT    Apply 1 application topically 2 (two) times daily.   VALACYCLOVIR  (VALTREX) 500 MG TABLET    TAKE 1 TABLET BY MOUTH DAILY  Modified Medications   No medications on file  Discontinued Medications   No medications on file    Subjective: Todd Carroll was seen recently after he developed some intermittent dysuria and urinary frequency after a condom he was wearing broke when he was having sex with a new male partner.  His UA and urine culture were completely normal.  His RPR was negative.  All site testing for GC chlamydia were negative.  He has continued to have some mild intermittent dysuria and urinary frequency.  He has also had some loose stools and intermittent diarrhea for the past 4 to 6 weeks.  He has not had any nausea, vomiting, abdominal pain or fever.  Had any recent new rash.  He received his initial monkeypox vaccine at his recent visit.  He says that he has diabetes in his family and he is worried that his blood sugar may be elevated or that he may have kidney problems.  Review of Systems: Review of Systems  Constitutional:  Negative for fever and weight loss.  Gastrointestinal:  Positive for diarrhea. Negative for abdominal pain, blood in stool, nausea and vomiting.  Genitourinary:  Positive for dysuria and frequency. Negative for urgency.  Musculoskeletal:  Positive for back pain.  Skin:  Negative for rash.   Past Medical History:  Diagnosis Date   ALLERGIC RHINITIS 08/27/2007   Allergy    GERD 05/04/2008    Social History   Tobacco Use   Smoking status: Never   Smokeless tobacco: Never  Vaping Use   Vaping Use: Never used  Substance Use Topics   Alcohol use: Not Currently    Comment: social , liquor-- 1 drink every month to 2 months   Drug use: No    Family History  Problem Relation Age of Onset   Multiple myeloma Mother    Diabetes Mother    Colon cancer Neg Hx    Esophageal cancer Neg Hx    Stomach cancer Neg Hx    Pancreatic cancer Neg Hx    Liver disease Neg Hx    Rectal cancer Neg Hx     Allergies  Allergen Reactions    Azithromycin Itching    Objective: Vitals:   08/10/21 0851  Weight: 275 lb (124.7 kg)   Body mass index is 36.28 kg/m.  Physical Exam Constitutional:      General: He is not in acute distress. Cardiovascular:     Rate and Rhythm: Normal rate and regular rhythm.     Heart sounds: No murmur heard. Pulmonary:     Effort: Pulmonary effort is normal.     Breath sounds: Normal breath sounds.  Abdominal:     Palpations: Abdomen is soft.     Tenderness: There is no abdominal tenderness. There is no right CVA tenderness or left CVA tenderness.  Skin:    Findings: No rash.  Psychiatric:        Mood and Affect: Mood normal.    Lab Results    Problem List Items Addressed This Visit       High   Human immunodeficiency virus (HIV) disease ([place])    His infection remains under excellent, long-term control.  He will continue Biktarvy and follow-up for his routine visit with Dr. Baxter Flattery.      Relevant Orders   CBC   Comprehensive metabolic panel     Unprioritized   Dysuria    I will get a CMP to check his blood sugar and renal function.      Diarrhea    I will check a GI pathogen panel.      Relevant Orders   Gastrointestinal Pathogen Panel PCR     Michel Bickers, MD [facility] [place] [facility] [phone] pager   [phone] cell 08/10/2021, 9:11 AM

## 2021-08-10 NOTE — Assessment & Plan Note (Signed)
I will get a CMP to check his blood sugar and renal function.

## 2021-08-10 NOTE — Assessment & Plan Note (Signed)
His infection remains under excellent, long-term control.  He will continue Biktarvy and follow-up for his routine visit with Dr. Drue Second.

## 2021-08-10 NOTE — Assessment & Plan Note (Signed)
I will check a GI pathogen panel.

## 2021-08-16 ENCOUNTER — Other Ambulatory Visit: Payer: Self-pay

## 2021-08-17 ENCOUNTER — Other Ambulatory Visit: Payer: Self-pay

## 2021-08-17 ENCOUNTER — Other Ambulatory Visit: Payer: Self-pay | Admitting: Internal Medicine

## 2021-08-17 DIAGNOSIS — A6001 Herpesviral infection of penis: Secondary | ICD-10-CM

## 2021-08-17 MED ORDER — VALACYCLOVIR HCL 500 MG PO TABS
500.0000 mg | ORAL_TABLET | Freq: Every day | ORAL | 5 refills | Status: DC
  Filled 2021-08-17: qty 30, 30d supply, fill #0
  Filled 2021-10-05: qty 30, 30d supply, fill #1

## 2021-08-17 MED FILL — Sodium Fluoride Cream 1.1%: DENTAL | 10 days supply | Qty: 51 | Fill #1 | Status: AC

## 2021-08-21 ENCOUNTER — Ambulatory Visit: Payer: Self-pay | Admitting: Nurse Practitioner

## 2021-08-23 ENCOUNTER — Other Ambulatory Visit: Payer: Self-pay | Admitting: Internal Medicine

## 2021-08-23 DIAGNOSIS — B20 Human immunodeficiency virus [HIV] disease: Secondary | ICD-10-CM

## 2021-08-28 ENCOUNTER — Telehealth: Payer: Self-pay

## 2021-08-28 NOTE — Telephone Encounter (Signed)
Patient called, states he never received his delivery of Biktarvy. It appears medication was dispensed 08/09/21, he says he cannot get another refill because it is too early.   Patient states he called [facility] and they were unable to help him and are showing that the medication was delivered. Patient states he will come pick up two weeks of Biktarvy samples to cover him until he is eligible for next refill.   Sandie Ano, RN

## 2021-08-28 NOTE — Telephone Encounter (Signed)
Sounds good. Thanks, Aundra Millet!

## 2021-08-30 ENCOUNTER — Other Ambulatory Visit: Payer: Self-pay | Admitting: Pharmacist

## 2021-08-30 DIAGNOSIS — B20 Human immunodeficiency virus [HIV] disease: Secondary | ICD-10-CM

## 2021-08-30 MED ORDER — BICTEGRAVIR-EMTRICITAB-TENOFOV 50-200-25 MG PO TABS: 1.0000 | ORAL_TABLET | Freq: Every day | ORAL | 0 refills | Status: AC

## 2021-08-30 NOTE — Progress Notes (Signed)
Medication Samples have been provided to the patient.  Drug name: Biktarvy        Strength: 50/200/25 mg       Qty: 14 tablets (2 bottles) LOT: [id number]   Exp.Date: 10/24  Dosing instructions: Take one tablet by mouth once daily  The patient has been instructed regarding the correct time, dose, and frequency of taking this medication, including desired effects and most common side effects.   Savva Beamer, PharmD, CPP Clinical Pharmacist Practitioner Infectious Diseases Clinical Pharmacist [facility]  

## 2021-08-31 ENCOUNTER — Other Ambulatory Visit: Payer: Self-pay | Admitting: Internal Medicine

## 2021-08-31 ENCOUNTER — Ambulatory Visit: Payer: Self-pay

## 2021-08-31 DIAGNOSIS — A6001 Herpesviral infection of penis: Secondary | ICD-10-CM

## 2021-09-08 ENCOUNTER — Ambulatory Visit: Payer: Self-pay | Admitting: Nurse Practitioner

## 2021-10-05 ENCOUNTER — Other Ambulatory Visit: Payer: Self-pay | Admitting: Nurse Practitioner

## 2021-10-05 ENCOUNTER — Other Ambulatory Visit: Payer: Self-pay

## 2021-10-05 DIAGNOSIS — L739 Follicular disorder, unspecified: Secondary | ICD-10-CM

## 2021-10-05 MED FILL — Sodium Fluoride Cream 1.1%: DENTAL | 10 days supply | Qty: 51 | Fill #2 | Status: AC

## 2021-10-06 ENCOUNTER — Other Ambulatory Visit: Payer: Self-pay

## 2021-10-06 ENCOUNTER — Other Ambulatory Visit: Payer: Self-pay | Admitting: Nurse Practitioner

## 2021-10-06 DIAGNOSIS — L739 Follicular disorder, unspecified: Secondary | ICD-10-CM

## 2021-10-06 MED ORDER — CIPROFLOXACIN-DEXAMETHASONE 0.3-0.1 % OT SUSP
OTIC | 0 refills | Status: DC
  Filled 2021-10-06: qty 7.5, 15d supply, fill #0

## 2021-10-06 MED ORDER — CLINDAMYCIN PHOSPHATE 1 % EX GEL
Freq: Two times a day (BID) | CUTANEOUS | 0 refills | Status: DC
  Filled 2021-10-06: qty 30, 30d supply, fill #0

## 2021-10-06 MED FILL — Hydroxyzine HCl Tab 25 MG: ORAL | 30 days supply | Qty: 90 | Fill #1 | Status: AC

## 2021-10-10 ENCOUNTER — Ambulatory Visit: Payer: Self-pay

## 2021-10-16 ENCOUNTER — Ambulatory Visit: Payer: Self-pay | Admitting: Nurse Practitioner

## 2021-10-17 ENCOUNTER — Emergency Department (HOSPITAL_COMMUNITY)
Admission: EM | Admit: 2021-10-17 | Discharge: 2021-10-17 | Disposition: A | Discharge status: Home / Self Care | Payer: No Typology Code available for payment source | Attending: Emergency Medicine | Admitting: Emergency Medicine

## 2021-10-17 ENCOUNTER — Emergency Department (HOSPITAL_COMMUNITY): Payer: No Typology Code available for payment source

## 2021-10-17 DIAGNOSIS — Y92410 Unspecified street and highway as the place of occurrence of the external cause: Secondary | ICD-10-CM | POA: Insufficient documentation

## 2021-10-17 DIAGNOSIS — M25562 Pain in left knee: Secondary | ICD-10-CM | POA: Insufficient documentation

## 2021-10-17 DIAGNOSIS — M542 Cervicalgia: Secondary | ICD-10-CM | POA: Insufficient documentation

## 2021-10-17 DIAGNOSIS — S0990XA Unspecified injury of head, initial encounter: Secondary | ICD-10-CM | POA: Insufficient documentation

## 2021-10-17 DIAGNOSIS — Z21 Asymptomatic human immunodeficiency virus [HIV] infection status: Secondary | ICD-10-CM | POA: Insufficient documentation

## 2021-10-17 DIAGNOSIS — M25561 Pain in right knee: Secondary | ICD-10-CM | POA: Diagnosis not present

## 2021-10-17 DIAGNOSIS — M25532 Pain in left wrist: Secondary | ICD-10-CM | POA: Diagnosis not present

## 2021-10-17 DIAGNOSIS — Z79899 Other long term (current) drug therapy: Secondary | ICD-10-CM | POA: Insufficient documentation
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
## 2021-10-17 NOTE — ED Notes (Signed)
Pt provided discharge instructions and prescription information. Pt was given the opportunity to ask questions and questions were answered. Discharge signature not obtained in the setting of the COVID-19 pandemic in order to reduce high touch surfaces.  ° °
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
## 2021-11-02 ENCOUNTER — Ambulatory Visit (INDEPENDENT_AMBULATORY_CARE_PROVIDER_SITE_OTHER): Payer: Self-pay | Admitting: Nurse Practitioner

## 2021-11-02 ENCOUNTER — Other Ambulatory Visit: Payer: Self-pay

## 2021-11-02 DIAGNOSIS — Z23 Encounter for immunization: Secondary | ICD-10-CM

## 2021-11-02 NOTE — Progress Notes (Signed)
Patient in for flu shot.

## 2021-11-06 ENCOUNTER — Ambulatory Visit: Payer: Self-pay | Admitting: Nurse Practitioner

## 2021-11-13 ENCOUNTER — Ambulatory Visit: Payer: Self-pay | Admitting: Nurse Practitioner

## 2021-11-17 ENCOUNTER — Ambulatory Visit (INDEPENDENT_AMBULATORY_CARE_PROVIDER_SITE_OTHER): Payer: Self-pay | Admitting: Nurse Practitioner

## 2021-11-17 ENCOUNTER — Encounter: Payer: Self-pay | Admitting: Nurse Practitioner

## 2021-11-17 ENCOUNTER — Other Ambulatory Visit: Payer: Self-pay

## 2021-11-17 VITALS — BP 125/85 | HR 90 | Temp 98.1°F | Ht 73.0 in | Wt 274.0 lb

## 2021-11-17 DIAGNOSIS — J321 Chronic frontal sinusitis: Secondary | ICD-10-CM

## 2021-11-17 DIAGNOSIS — R7303 Prediabetes: Secondary | ICD-10-CM

## 2021-11-17 DIAGNOSIS — L739 Follicular disorder, unspecified: Secondary | ICD-10-CM

## 2021-11-17 DIAGNOSIS — R059 Cough, unspecified: Secondary | ICD-10-CM

## 2021-11-17 DIAGNOSIS — K219 Gastro-esophageal reflux disease without esophagitis: Secondary | ICD-10-CM

## 2021-11-17 DIAGNOSIS — Z13220 Encounter for screening for lipoid disorders: Secondary | ICD-10-CM

## 2021-11-17 DIAGNOSIS — B20 Human immunodeficiency virus [HIV] disease: Secondary | ICD-10-CM

## 2021-11-17 DIAGNOSIS — R7989 Other specified abnormal findings of blood chemistry: Secondary | ICD-10-CM

## 2021-11-17 LAB — POCT URINALYSIS DIP (CLINITEK)
Bilirubin, UA: NEGATIVE
Blood, UA: NEGATIVE
Glucose, UA: NEGATIVE mg/dL
Ketones, POC UA: NEGATIVE mg/dL
Leukocytes, UA: NEGATIVE
Nitrite, UA: NEGATIVE
POC PROTEIN,UA: NEGATIVE
Spec Grav, UA: >=1.03 — AB (ref 1.010–1.025)
Urobilinogen, UA: 0.2 E.U./dL
pH, UA: 7 (ref 5.0–8.0)

## 2021-11-17 LAB — POCT GLYCOSYLATED HEMOGLOBIN (HGB A1C)
HbA1c POC (<> result, manual entry): 6.3 % (ref 4.0–5.6)
HbA1c, POC (controlled diabetic range): 6.3 % (ref 0.0–7.0)
HbA1c, POC (prediabetic range): 6.3 % (ref 5.7–6.4)
Hemoglobin A1C: 6.3 % — AB (ref 4.0–5.6)

## 2021-11-17 MED ORDER — AMOXICILLIN-POT CLAVULANATE 875-125 MG PO TABS
1.0000 | ORAL_TABLET | Freq: Two times a day (BID) | ORAL | 0 refills | Status: AC
Start: 2021-11-17 — End: 2021-11-27
  Filled 2021-11-17: qty 20, 10d supply, fill #0

## 2021-11-17 MED ORDER — CIPROFLOXACIN-DEXAMETHASONE 0.3-0.1 % OT SUSP
4.0000 [drp] | Freq: Two times a day (BID) | OTIC | 1 refills | Status: DC
  Filled 2021-11-17 – 2022-02-09 (×2): qty 7.5, 8d supply, fill #0

## 2021-11-17 MED ORDER — CLINDAMYCIN PHOSPHATE 1 % EX GEL
Freq: Two times a day (BID) | CUTANEOUS | 1 refills | Status: DC
  Filled 2021-11-17 – 2022-02-09 (×2): qty 30, 15d supply, fill #0

## 2021-11-17 MED ORDER — TRUE METRIX METER W/DEVICE KIT
PACK | 0 refills | Status: DC
  Filled 2021-11-17: qty 1, 30d supply, fill #0

## 2021-11-17 MED ORDER — ALBUTEROL SULFATE HFA 108 (90 BASE) MCG/ACT IN AERS
1.0000 | INHALATION_SPRAY | Freq: Four times a day (QID) | RESPIRATORY_TRACT | 0 refills | Status: DC | PRN
  Filled 2021-11-17: qty 18, 25d supply, fill #0

## 2021-11-17 MED ORDER — ALBUTEROL SULFATE HFA 108 (90 BASE) MCG/ACT IN AERS
2.0000 | INHALATION_SPRAY | Freq: Four times a day (QID) | RESPIRATORY_TRACT | 0 refills | Status: DC | PRN
  Filled 2021-11-17: qty 8, fill #0

## 2021-11-17 MED ORDER — TRIAMCINOLONE ACETONIDE 0.025 % EX OINT
TOPICAL_OINTMENT | Freq: Two times a day (BID) | CUTANEOUS | 0 refills | Status: DC
  Filled 2021-11-17 – 2022-02-09 (×2): qty 30, 15d supply, fill #0

## 2021-11-17 MED ORDER — CLOTRIMAZOLE-BETAMETHASONE 1-0.05 % EX CREA
1.0000 | TOPICAL_CREAM | Freq: Two times a day (BID) | CUTANEOUS | 0 refills | Status: AC
Start: 2021-11-17 — End: 2021-12-02
  Filled 2021-11-17 – 2022-02-09 (×2): qty 30, 15d supply, fill #0

## 2021-11-17 NOTE — Patient Instructions (Signed)
Prediabetes Eating Plan °Prediabetes is a condition that causes blood sugar (glucose) levels to be higher than normal. This increases the risk for developing type 2 diabetes (type 2 diabetes mellitus). Working with a health care provider or nutrition specialist (dietitian) to make diet and lifestyle changes can help prevent the onset of diabetes. These changes may help you: °Control your blood glucose levels. °Improve your cholesterol levels. °Manage your blood pressure. °What are tips for following this plan? °Reading food labels °Read food labels to check the amount of fat, salt (sodium), and sugar in prepackaged foods. Avoid foods that have: °Saturated fats. °Trans fats. °Added sugars. °Avoid foods that have more than 300 milligrams (mg) of sodium per serving. Limit your sodium intake to less than 2,300 mg each day. °Shopping °Avoid buying pre-made and processed foods. °Avoid buying drinks with added sugar. °Cooking °Cook with olive oil. Do not use butter, lard, or ghee. °Bake, broil, grill, steam, or boil foods. Avoid frying. °Meal planning ° °Work with your dietitian to create an eating plan that is right for you. This may include tracking how many calories you take in each day. Use a food diary, notebook, or mobile application to track what you eat at each meal. °Consider following a Mediterranean diet. This includes: °Eating several servings of fresh fruits and vegetables each day. °Eating fish at least twice a week. °Eating one serving each day of whole grains, beans, nuts, and seeds. °Using olive oil instead of other fats. °Limiting alcohol. °Limiting red meat. °Using nonfat or low-fat dairy products. °Consider following a plant-based diet. This includes dietary choices that focus on eating mostly vegetables and fruit, grains, beans, nuts, and seeds. °If you have high blood pressure, you may need to limit your sodium intake or follow a diet such as the DASH (Dietary Approaches to Stop Hypertension) eating  plan. The DASH diet aims to lower high blood pressure. °Lifestyle °Set weight loss goals with help from your health care team. It is recommended that most people with prediabetes lose 7% of their body weight. °Exercise for at least 30 minutes 5 or more days a week. °Attend a support group or seek support from a mental health counselor. °Take over-the-counter and prescription medicines only as told by your health care provider. °What foods are recommended? °Fruits °Berries. Bananas. Apples. Oranges. Grapes. Papaya. Mango. Pomegranate. Kiwi. Grapefruit. Cherries. °Vegetables °Lettuce. Spinach. Peas. Beets. Cauliflower. Cabbage. Broccoli. Carrots. Tomatoes. Squash. Eggplant. Herbs. Peppers. Onions. Cucumbers. Brussels sprouts. °Grains °Whole grains, such as whole-wheat or whole-grain breads, crackers, cereals, and pasta. Unsweetened oatmeal. Bulgur. Barley. Quinoa. Brown rice. Corn or whole-wheat flour tortillas or taco shells. °Meats and other proteins °Seafood. Poultry without skin. Lean cuts of pork and beef. Tofu. Eggs. Nuts. Beans. °Dairy °Low-fat or fat-free dairy products, such as yogurt, cottage cheese, and cheese. °Beverages °Water. Tea. Coffee. Sugar-free or diet soda. Seltzer water. Low-fat or nonfat milk. Milk alternatives, such as soy or almond milk. °Fats and oils °Olive oil. Canola oil. Sunflower oil. Grapeseed oil. Avocado. Walnuts. °Sweets and desserts °Sugar-free or low-fat pudding. Sugar-free or low-fat ice cream and other frozen treats. °Seasonings and condiments °Herbs. Sodium-free spices. Mustard. Relish. Low-salt, low-sugar ketchup. Low-salt, low-sugar barbecue sauce. Low-fat or fat-free mayonnaise. °The items listed above may not be a complete list of recommended foods and beverages. Contact a dietitian for more information. °What foods are not recommended? °Fruits °Fruits canned with syrup. °Vegetables °Canned vegetables. Frozen vegetables with butter or cream sauce. °Grains °Refined white  flour and flour   products, such as bread, pasta, snack foods, and cereals. °Meats and other proteins °Fatty cuts of meat. Poultry with skin. Breaded or fried meat. Processed meats. °Dairy °Full-fat yogurt, cheese, or milk. °Beverages °Sweetened drinks, such as iced tea and soda. °Fats and oils °Butter. Lard. Ghee. °Sweets and desserts °Baked goods, such as cake, cupcakes, pastries, cookies, and cheesecake. °Seasonings and condiments °Spice mixes with added salt. Ketchup. Barbecue sauce. Mayonnaise. °The items listed above may not be a complete list of foods and beverages that are not recommended. Contact a dietitian for more information. °Where to find more information °American Diabetes Association: www.diabetes.org °Summary °You may need to make diet and lifestyle changes to help prevent the onset of diabetes. These changes can help you control blood sugar, improve cholesterol levels, and manage blood pressure. °Set weight loss goals with help from your health care team. It is recommended that most people with prediabetes lose 7% of their body weight. °Consider following a Mediterranean diet. This includes eating plenty of fresh fruits and vegetables, whole grains, beans, nuts, seeds, fish, and low-fat dairy, and using olive oil instead of other fats. °This information is not intended to replace advice given to you by your health care provider. Make sure you discuss any questions you have with your health care provider. °Document Revised: 02/18/2020 Document Reviewed: 02/18/2020 °Elsevier Patient Education © 2022 [employer] ° °

## 2021-11-17 NOTE — Progress Notes (Signed)
[address], [place]  [address] Phone:  [phone]   Fax:  [phone]   Established Patient Office Visit  Subjective:  Patient ID: Todd Carroll, male    DOB: 03/21/1976  Age: 45 y.o. MRN: [id number]  CC:  Chief Complaint  Patient presents with   Follow-up    Cold past 2 months, coughing wheezing. Was taking OTC meds    HPI Todd Carroll presents for follow up. He  has a past medical history of ALLERGIC RHINITIS (08/27/2007), Allergy, and GERD (05/04/2008).   Cough Patient complains of productive cough with sputum described as yellow and sneezing. Symptoms began 2 months ago. Symptoms have been gradually worsening since that time.The cough is productive and is aggravated by nothing. Associated symptoms include: change in voice, chest pain, fever, night sweats, and shortness of breath. Patient does not have new pets. Patient does not have a history of asthma. Patient does have a history of environmental allergens. Patient has not traveled recently. Patient does not have a history of smoking. Patient has not had a previous chest x-ray. Patient has not had a PPD done. He was diagnosed with sinusitis however was not treated.   He has prediabetes. He does monitor his CBG. His A1c is 6.3% up from 5.9%.   Past Medical History:  Diagnosis Date   ALLERGIC RHINITIS 08/27/2007   Allergy    GERD 05/04/2008    Past Surgical History:  Procedure Laterality Date   APPENDECTOMY      Family History  Problem Relation Age of Onset   Multiple myeloma Mother    Diabetes Mother    Colon cancer Neg Hx    Esophageal cancer Neg Hx    Stomach cancer Neg Hx    Pancreatic cancer Neg Hx    Liver disease Neg Hx    Rectal cancer Neg Hx     Social History   Socioeconomic History   Marital status: Single    Spouse name: Not on file   Number of children: Not on file   Years of education: Not on file   Highest education level: Not on file   Occupational History   Not on file  Tobacco Use   Smoking status: Never   Smokeless tobacco: Never  Vaping Use   Vaping Use: Never used  Substance and Sexual Activity   Alcohol use: Not Currently    Comment: social , liquor-- 1 drink every month to 2 months   Drug use: No   Sexual activity: Not Currently    Partners: Female, Male    Birth control/protection: Condom    Comment: pt given condoms  Other Topics Concern   Not on file  Social History Narrative   Not on file   Social Determinants of Health   Financial Resource Strain: Not on file  Food Insecurity: Not on file  Transportation Needs: Not on file  Physical Activity: Not on file  Stress: Not on file  Social Connections: Not on file  Intimate Partner Violence: Not on file    Outpatient Medications Prior to Visit  Medication Sig Dispense Refill   BIKTARVY 50-200-25 MG TABS tablet TAKE 1 TABLET BY MOUTH DAILY 30 tablet 2   hydrOXYzine (ATARAX/VISTARIL) 25 MG tablet TAKE 1 TABLET (25 MG TOTAL) BY MOUTH 3 (THREE) TIMES DAILY AS NEEDED FOR ITCHING. 90 tablet 6   multivitamin (ONE-A-DAY MEN'S) TABS tablet Take 1 tablet by mouth daily. 100 tablet 0  omeprazole (PRILOSEC) 20 MG capsule TAKE 2 CAPSULES BY MOUTH EVERY MORNING BEFORE EATING 90 capsule 3   sodium fluoride (PREVIDENT 5000 PLUS) 1.1 % CREA dental cream BRUSH ON TEETH AS NEEDED. 51 g 3   valACYclovir (VALTREX) 500 MG tablet TAKE 1 TABLET BY MOUTH DAILY 60 tablet 5   carbamide peroxide (DEBROX) 6.5 % OTIC solution PLACE 5 DROPS INTO BOTH EARS 2 (TWO) TIMES DAILY. 15 mL 0   ciprofloxacin-dexamethasone (CIPRODEX) OTIC suspension PLACE 4 DROPS INTO THE RIGHT EAR 2 (TWO) TIMES DAILY. 7.5 mL 0   clindamycin (CLINDAGEL) 1 % gel Apply topically 2 (two) times daily. 30 g 0   clotrimazole-betamethasone (LOTRISONE) cream Apply 1 application topically 2 (two) times daily. 30 g 0   triamcinolone (KENALOG) 0.025 % ointment APPLY 1 APPLICATION TOPICALLY 2 (TWO) TIMES DAILY. 30 g  0   carbamide peroxide (EAR DROPS) 6.5 % OTIC solution Place 5 drops into both ears 2 (two) times daily. 15 mL 0   hydrOXYzine (ATARAX/VISTARIL) 25 MG tablet Take 1 tablet (25 mg total) by mouth 3 (three) times daily as needed for itching. 90 tablet 6   SF 5000 PLUS 1.1 % CREA dental cream Take by mouth daily.     triamcinolone (KENALOG) 0.025 % ointment Apply 1 application topically 2 (two) times daily. 30 g 0   No facility-administered medications prior to visit.    Allergies  Allergen Reactions   Azithromycin Itching    ROS Review of Systems    Objective:    Physical Exam Constitutional:      Appearance: He is obese.  HENT:     Head: Normocephalic and atraumatic.     Right Ear: Tympanic membrane normal.     Left Ear: Tympanic membrane normal.     Nose: Nose normal.     Mouth/Throat:     Mouth: Mucous membranes are moist.  Cardiovascular:     Rate and Rhythm: Normal rate and regular rhythm.     Pulses: Normal pulses.     Heart sounds: Normal heart sounds.  Pulmonary:     Effort: Pulmonary effort is normal.     Breath sounds: Wheezing present.  Abdominal:     Palpations: Abdomen is soft.  Musculoskeletal:        General: Normal range of motion.  Lymphadenopathy:     Cervical: No cervical adenopathy.  Skin:    General: Skin is warm and dry.     Capillary Refill: Capillary refill takes less than 2 seconds.  Neurological:     General: No focal deficit present.     Mental Status: He is alert.  Psychiatric:        Mood and Affect: Mood normal.        Behavior: Behavior normal.        Thought Content: Thought content normal.    BP 125/85 (BP Location: Right Arm, Patient Position: Sitting)    Pulse 90    Temp 98.1 F (36.7 C)    Ht 6' 1"  (1.854 m)    Wt 274 lb 0.2 oz (124.3 kg)    SpO2 92%    BMI 36.15 kg/m  Wt Readings from Last 3 Encounters:  11/17/21 274 lb 0.2 oz (124.3 kg)  08/10/21 275 lb (124.7 kg)  08/03/21 272 lb (123.4 kg)     Health Maintenance  Due  Topic Date Due   COVID-19 Vaccine (4 - Booster for Pfizer series) 01/03/2021    There are no preventive care reminders to  display for this patient.  Lab Results  Component Value Date   TSH 0.352 (L) 10/10/2018   Lab Results  Component Value Date   WBC 7.3 08/10/2021   HGB 13.4 08/10/2021   HCT 40.9 08/10/2021   MCV 90.9 08/10/2021   PLT 388 08/10/2021   Lab Results  Component Value Date   NA 139 08/10/2021   K 4.4 08/10/2021   CO2 27 08/10/2021   GLUCOSE 97 08/10/2021   BUN 14 08/10/2021   CREATININE 1.12 08/10/2021   BILITOT 0.2 08/10/2021   ALKPHOS 127 (H) 09/21/2020   AST 19 08/10/2021   ALT 17 08/10/2021   PROT 7.7 08/10/2021   ALBUMIN 4.4 09/21/2020   CALCIUM 9.6 08/10/2021   Lab Results  Component Value Date   CHOL 184 11/08/2020   Lab Results  Component Value Date   HDL 42 11/08/2020   Lab Results  Component Value Date   LDLCALC 113 (H) 11/08/2020   Lab Results  Component Value Date   TRIG 175 (H) 11/08/2020   Lab Results  Component Value Date   CHOLHDL 4.4 11/08/2020   Lab Results  Component Value Date   HGBA1C 6.3 (A) 11/17/2021   HGBA1C 6.3 11/17/2021   HGBA1C 6.3 11/17/2021   HGBA1C 6.3 11/17/2021      Assessment & Plan:   Problem List Items Addressed This Visit       Digestive   GERD Stable Continue with current regimen.  No changes warranted. Good patient compliance.      Musculoskeletal and Integument   Folliculitis   Relevant Medications   clindamycin (CLINDAGEL) 1 % gel     Other   Human immunodeficiency virus (HIV) disease ([place]) Stable Enourage patient to keep all follow-up visits with your infectious disease. Take your medicines as directed Get all recommended lab tests Encourage patient to make choices that keep you healthy and protect others.    Relevant Medications   clotrimazole-betamethasone (LOTRISONE) cream   Other Visit Diagnoses     Prediabetes    -  Primary Consider home glucose  monitoring Weight loss at least 5% of current body weight is can be achieved with lifestyle modification dietary changes and regular daily exercise Encourage blood pressure control goal <120/80 and maintaining total cholesterol <200 Follow-up every 3 to 6 months for reevaluation Education material provided    Relevant Orders   HgB A1c (Completed)   Comp. Metabolic Panel (12)   POCT URINALYSIS DIP (CLINITEK)   Cough, unspecified type     Persistent    Relevant Medications   albuterol (VENTOLIN HFA) 108 (90 Base) MCG/ACT inhaler   Abnormal TSH       Screening for cholesterol level       Relevant Orders   Lipid panel       Meds ordered this encounter  Medications   Blood Glucose Monitoring Suppl (TRUE METRIX METER) w/Device KIT    Sig: Use as directed up to 4 times daily.    Dispense:  1 kit    Refill:  0    Order Specific Question:   Supervising Provider    Answer:   Tresa Garter [id number]    Order Specific Question:   Number of strips    Answer:   100    Order Specific Question:   Number of lancets    Answer:   100   clindamycin (CLINDAGEL) 1 % gel    Sig: Apply topically 2 (two) times daily.    Dispense:  30 g    Refill:  1    Order Specific Question:   Supervising Provider    Answer:   Tresa Garter [[id number]]   clotrimazole-betamethasone (LOTRISONE) cream    Sig: Apply 1 application topically 2 (two) times daily for 15 days.    Dispense:  30 g    Refill:  0    Order Specific Question:   Supervising Provider    Answer:   Tresa Garter [id number]   triamcinolone (KENALOG) 0.025 % ointment    Sig: Apply topically 2 (two) times daily.    Dispense:  30 g    Refill:  0    Order Specific Question:   Supervising Provider    Answer:   Tresa Garter [[id number]]   ciprofloxacin-dexamethasone (CIPRODEX) OTIC suspension    Sig: Place 4 drops into both ears 2 (two) times daily.    Dispense:  36 mL    Refill:  1    Order Specific Question:    Supervising Provider    Answer:   Tresa Garter [[id number]]   amoxicillin-clavulanate (AUGMENTIN) 875-125 MG tablet    Sig: Take 1 tablet by mouth 2 (two) times daily for 10 days.    Dispense:  20 tablet    Refill:  0    Order Specific Question:   Supervising Provider    Answer:   Tresa Garter [[id number]]   DISCONTD: albuterol (VENTOLIN HFA) 108 (90 Base) MCG/ACT inhaler    Sig: Inhale 2 puffs into the lungs every 6 (six) hours as needed for wheezing or shortness of breath.    Dispense:  8 g    Refill:  0    Order Specific Question:   Supervising Provider    Answer:   Tresa Garter [[id number]]   albuterol (VENTOLIN HFA) 108 (90 Base) MCG/ACT inhaler    Sig: Inhale 1-2 puffs into the lungs every 6 (six) hours as needed for wheezing or shortness of breath.    Dispense:  18 g    Refill:  0    Order Specific Question:   Supervising Provider    Answer:   Tresa Garter [id number]    Follow-up: No follow-ups on file.    Vevelyn Francois, NP

## 2021-11-18 LAB — COMP. METABOLIC PANEL (12)
AST: 18 IU/L (ref 0–40)
Albumin/Globulin Ratio: 1.3 (ref 1.2–2.2)
Albumin: 4.3 g/dL (ref 4.0–5.0)
Alkaline Phosphatase: 116 IU/L (ref 44–121)
BUN/Creatinine Ratio: 13 (ref 9–20)
BUN: 14 mg/dL (ref 6–24)
Bilirubin Total: <0.2 mg/dL (ref 0.0–1.2)
Calcium: 9.3 mg/dL (ref 8.7–10.2)
Chloride: 101 mmol/L (ref 96–106)
Creatinine, Ser: 1.04 mg/dL (ref 0.76–1.27)
Globulin, Total: 3.4 g/dL (ref 1.5–4.5)
Glucose: 97 mg/dL (ref 70–99)
Potassium: 4.8 mmol/L (ref 3.5–5.2)
Sodium: 140 mmol/L (ref 134–144)
Total Protein: 7.7 g/dL (ref 6.0–8.5)
eGFR: 91 mL/min/{1.73_m2} (ref >59)

## 2021-11-18 LAB — LIPID PANEL
Chol/HDL Ratio: 5 ratio (ref 0.0–5.0)
Cholesterol, Total: 211 mg/dL — ABNORMAL HIGH (ref 100–199)
HDL: 42 mg/dL (ref >39)
LDL Chol Calc (NIH): 103 mg/dL — ABNORMAL HIGH (ref 0–99)
Triglycerides: 391 mg/dL — ABNORMAL HIGH (ref 0–149)
VLDL Cholesterol Cal: 66 mg/dL — ABNORMAL HIGH (ref 5–40)

## 2021-11-21 ENCOUNTER — Other Ambulatory Visit: Payer: Self-pay

## 2021-11-21 MED ORDER — MELOXICAM 7.5 MG PO TABS
ORAL_TABLET | ORAL | 1 refills | Status: DC
Start: 2021-11-21 — End: 2022-05-09
  Filled 2021-11-21 – 2022-03-21 (×3): qty 30, 30d supply, fill #0

## 2021-11-21 MED ORDER — TIZANIDINE HCL 4 MG PO TABS
ORAL_TABLET | ORAL | 0 refills | Status: DC
  Filled 2021-11-21: qty 60, 30d supply, fill #0

## 2021-12-13 ENCOUNTER — Telehealth: Payer: Self-pay

## 2021-12-13 ENCOUNTER — Encounter: Payer: Self-pay | Admitting: Internal Medicine

## 2021-12-13 NOTE — Telephone Encounter (Signed)
Contacted patient to assist with scheduling an appointment for financial renewal, labs, and office visit with Dr. Drue Second. Also offered patient assistance with Mychart sign up, patient declined. When trying to transfer the call to the front desk for scheduling patient hung up the phone. When trying to call patient back he did not answer the phone.  Valarie Cones

## 2021-12-21 ENCOUNTER — Other Ambulatory Visit: Payer: Self-pay | Admitting: Internal Medicine

## 2021-12-21 DIAGNOSIS — B20 Human immunodeficiency virus [HIV] disease: Secondary | ICD-10-CM

## 2022-01-05 ENCOUNTER — Telehealth: Payer: Self-pay

## 2022-01-05 ENCOUNTER — Other Ambulatory Visit: Payer: Self-pay | Admitting: Nurse Practitioner

## 2022-01-05 NOTE — Telephone Encounter (Signed)
Attempted to call patient to see if he was interested in getting second monkeypox vaccine. No voicemail box to leave a message.   Reilley Latorre Lesli Albee, CMA

## 2022-01-08 ENCOUNTER — Other Ambulatory Visit: Payer: Self-pay | Admitting: Internal Medicine

## 2022-01-08 DIAGNOSIS — A6001 Herpesviral infection of penis: Secondary | ICD-10-CM

## 2022-01-26 ENCOUNTER — Other Ambulatory Visit: Payer: Self-pay

## 2022-02-04 ENCOUNTER — Other Ambulatory Visit: Payer: Self-pay | Admitting: Internal Medicine

## 2022-02-04 DIAGNOSIS — A6001 Herpesviral infection of penis: Secondary | ICD-10-CM

## 2022-02-05 NOTE — Telephone Encounter (Signed)
Awaiting provider approval.  ?

## 2022-02-09 ENCOUNTER — Other Ambulatory Visit: Payer: Self-pay | Admitting: Nurse Practitioner

## 2022-02-09 ENCOUNTER — Other Ambulatory Visit: Payer: Self-pay

## 2022-02-09 DIAGNOSIS — R059 Cough, unspecified: Secondary | ICD-10-CM

## 2022-02-09 MED ORDER — ALBUTEROL SULFATE HFA 108 (90 BASE) MCG/ACT IN AERS
1.0000 | INHALATION_SPRAY | Freq: Four times a day (QID) | RESPIRATORY_TRACT | 0 refills | Status: DC | PRN
Start: 2022-02-09 — End: 2022-02-22
  Filled 2022-02-09: qty 18, 25d supply, fill #0

## 2022-02-12 ENCOUNTER — Other Ambulatory Visit: Payer: Self-pay

## 2022-02-15 ENCOUNTER — Ambulatory Visit: Payer: Self-pay

## 2022-02-15 ENCOUNTER — Other Ambulatory Visit: Payer: Self-pay

## 2022-02-16 ENCOUNTER — Telehealth: Payer: Self-pay | Admitting: Nurse Practitioner

## 2022-02-16 ENCOUNTER — Other Ambulatory Visit: Payer: Self-pay | Admitting: Nurse Practitioner

## 2022-02-16 ENCOUNTER — Other Ambulatory Visit: Payer: Self-pay

## 2022-02-16 LAB — C. TRACHOMATIS/N. GONORRHOEAE RNA
C. trachomatis RNA, TMA: NOT DETECTED
N. gonorrhoeae RNA, TMA: NOT DETECTED

## 2022-02-16 MED ORDER — HYDROXYZINE HCL 25 MG PO TABS
25.0000 mg | ORAL_TABLET | Freq: Three times a day (TID) | ORAL | 1 refills | Status: DC | PRN
  Filled 2022-02-16: qty 90, 30d supply, fill #0

## 2022-02-16 NOTE — Telephone Encounter (Signed)
Eyedrops

## 2022-02-16 NOTE — Telephone Encounter (Signed)
Refill for hydroxyzine and eye drops  ?

## 2022-02-18 LAB — COMPLETE METABOLIC PANEL WITH GFR
AG Ratio: 1.4 (calc) (ref 1.0–2.5)
ALT: 19 U/L (ref 9–46)
AST: 19 U/L (ref 10–40)
Albumin: 4.6 g/dL (ref 3.6–5.1)
Alkaline phosphatase (APISO): 85 U/L (ref 36–130)
BUN: 12 mg/dL (ref 7–25)
CO2: 25 mmol/L (ref 20–32)
Calcium: 9.7 mg/dL (ref 8.6–10.3)
Chloride: 104 mmol/L (ref 98–110)
Creat: 1.07 mg/dL (ref 0.60–1.29)
Globulin: 3.2 g/dL (calc) (ref 1.9–3.7)
Glucose, Bld: 91 mg/dL (ref 65–99)
Potassium: 4.3 mmol/L (ref 3.5–5.3)
Sodium: 140 mmol/L (ref 135–146)
Total Bilirubin: 0.3 mg/dL (ref 0.2–1.2)
Total Protein: 7.8 g/dL (ref 6.1–8.1)
eGFR: 87 mL/min/{1.73_m2} (ref >=60)

## 2022-02-18 LAB — CBC WITH DIFFERENTIAL/PLATELET
Absolute Monocytes: 538 cells/uL (ref 200–950)
Basophils Absolute: 32 cells/uL (ref 0–200)
Basophils Relative: 0.5 %
Eosinophils Absolute: 282 cells/uL (ref 15–500)
Eosinophils Relative: 4.4 %
HCT: 39.1 % (ref 38.5–50.0)
Hemoglobin: 13 g/dL — ABNORMAL LOW (ref 13.2–17.1)
Lymphs Abs: 2816 cells/uL (ref 850–3900)
MCH: 30 pg (ref 27.0–33.0)
MCHC: 33.2 g/dL (ref 32.0–36.0)
MCV: 90.1 fL (ref 80.0–100.0)
MPV: 9.8 fL (ref 7.5–12.5)
Monocytes Relative: 8.4 %
Neutro Abs: 2733 cells/uL (ref 1500–7800)
Neutrophils Relative %: 42.7 %
Platelets: 397 10*3/uL (ref 140–400)
RBC: 4.34 10*6/uL (ref 4.20–5.80)
RDW: 13.7 % (ref 11.0–15.0)
Total Lymphocyte: 44 %
WBC: 6.4 10*3/uL (ref 3.8–10.8)

## 2022-02-18 LAB — T-HELPER CELLS (CD4) COUNT (NOT AT ARMC)
Absolute CD4: 1057 cells/uL (ref 490–1740)
CD4 T Helper %: 36 % (ref 30–61)
Total lymphocyte count: 2934 cells/uL (ref 850–3900)

## 2022-02-18 LAB — HIV-1 RNA QUANT-NO REFLEX-BLD
HIV 1 RNA Quant: NOT DETECTED Copies/mL
HIV-1 RNA Quant, Log: NOT DETECTED Log cps/mL

## 2022-02-18 LAB — RPR: RPR Ser Ql: NONREACTIVE

## 2022-02-22 ENCOUNTER — Other Ambulatory Visit: Payer: Self-pay

## 2022-02-22 ENCOUNTER — Encounter: Payer: Self-pay | Admitting: Nurse Practitioner

## 2022-02-22 ENCOUNTER — Other Ambulatory Visit (HOSPITAL_BASED_OUTPATIENT_CLINIC_OR_DEPARTMENT_OTHER): Payer: Self-pay

## 2022-02-22 ENCOUNTER — Other Ambulatory Visit: Payer: Self-pay | Admitting: Nurse Practitioner

## 2022-02-22 ENCOUNTER — Ambulatory Visit (INDEPENDENT_AMBULATORY_CARE_PROVIDER_SITE_OTHER): Payer: Self-pay | Admitting: Nurse Practitioner

## 2022-02-22 VITALS — BP 117/90 | HR 80 | Temp 97.6°F | Ht 73.0 in | Wt 266.6 lb

## 2022-02-22 DIAGNOSIS — R7303 Prediabetes: Secondary | ICD-10-CM

## 2022-02-22 DIAGNOSIS — K219 Gastro-esophageal reflux disease without esophagitis: Secondary | ICD-10-CM

## 2022-02-22 DIAGNOSIS — M4302 Spondylolysis, cervical region: Secondary | ICD-10-CM | POA: Insufficient documentation

## 2022-02-22 DIAGNOSIS — E782 Mixed hyperlipidemia: Secondary | ICD-10-CM

## 2022-02-22 DIAGNOSIS — L739 Follicular disorder, unspecified: Secondary | ICD-10-CM

## 2022-02-22 DIAGNOSIS — R7989 Other specified abnormal findings of blood chemistry: Secondary | ICD-10-CM

## 2022-02-22 DIAGNOSIS — L299 Pruritus, unspecified: Secondary | ICD-10-CM

## 2022-02-22 DIAGNOSIS — R059 Cough, unspecified: Secondary | ICD-10-CM

## 2022-02-22 DIAGNOSIS — B20 Human immunodeficiency virus [HIV] disease: Secondary | ICD-10-CM

## 2022-02-22 HISTORY — DX: Spondylolysis, cervical region: M43.02

## 2022-02-22 LAB — POCT GLYCOSYLATED HEMOGLOBIN (HGB A1C): HbA1c, POC (prediabetic range): 5.9 % (ref 5.7–6.4)

## 2022-02-22 MED ORDER — CLINDAMYCIN PHOSPHATE 1 % EX GEL
Freq: Two times a day (BID) | CUTANEOUS | 1 refills | Status: DC
  Filled 2022-02-22: qty 30, fill #0
  Filled 2022-03-02 – 2022-03-20 (×2): qty 30, 15d supply, fill #0
  Filled 2022-05-08: qty 30, 15d supply, fill #1

## 2022-02-22 MED ORDER — ALBUTEROL SULFATE HFA 108 (90 BASE) MCG/ACT IN AERS: 1.0000 | INHALATION_SPRAY | Freq: Four times a day (QID) | RESPIRATORY_TRACT | 0 refills | Status: DC | PRN

## 2022-02-22 MED ORDER — OMEPRAZOLE 40 MG PO CPDR
40.0000 mg | DELAYED_RELEASE_CAPSULE | Freq: Every day | ORAL | 3 refills | Status: DC
  Filled 2022-02-22 – 2022-03-20 (×3): qty 30, 30d supply, fill #0

## 2022-02-22 MED ORDER — TRIAMCINOLONE ACETONIDE 0.025 % EX OINT: TOPICAL_OINTMENT | Freq: Two times a day (BID) | CUTANEOUS | 0 refills | Status: DC

## 2022-02-22 MED ORDER — TRIAMCINOLONE ACETONIDE 0.025 % EX OINT
TOPICAL_OINTMENT | Freq: Two times a day (BID) | CUTANEOUS | 0 refills | Status: DC
  Filled 2022-02-22: qty 30, fill #0
  Filled 2022-03-02 – 2022-03-20 (×2): qty 30, 15d supply, fill #0

## 2022-02-22 MED ORDER — OMEPRAZOLE 40 MG PO CPDR: 40.0000 mg | DELAYED_RELEASE_CAPSULE | Freq: Every day | ORAL | 3 refills | Status: DC

## 2022-02-22 MED ORDER — ALBUTEROL SULFATE HFA 108 (90 BASE) MCG/ACT IN AERS
1.0000 | INHALATION_SPRAY | Freq: Four times a day (QID) | RESPIRATORY_TRACT | 0 refills | Status: DC | PRN
  Filled 2022-02-22 – 2022-03-20 (×3): qty 18, 25d supply, fill #0

## 2022-02-22 MED ORDER — HYDROXYZINE PAMOATE 50 MG PO CAPS
50.0000 mg | ORAL_CAPSULE | Freq: Three times a day (TID) | ORAL | 11 refills | Status: DC | PRN
  Filled 2022-02-22 – 2022-03-20 (×3): qty 30, 10d supply, fill #0
  Filled 2022-05-08: qty 90, 30d supply, fill #1
  Filled 2022-06-25 – 2022-07-02 (×2): qty 240, 80d supply, fill #2

## 2022-02-22 MED ORDER — CLINDAMYCIN PHOSPHATE 1 % EX GEL: Freq: Two times a day (BID) | CUTANEOUS | 1 refills | Status: DC

## 2022-02-22 MED ORDER — HYDROXYZINE PAMOATE 50 MG PO CAPS: 50.0000 mg | ORAL_CAPSULE | Freq: Three times a day (TID) | ORAL | 11 refills | Status: DC | PRN

## 2022-02-22 NOTE — Patient Instructions (Addendum)
Todd Carroll ?This is your after visit summary for today.  ?1. Human immunodeficiency virus (HIV) disease ([facility]) ? ?2. Cough, unspecified type ?- albuterol (VENTOLIN HFA) 108 (90 Base) MCG/ACT inhaler; Inhale 1-2 puffs into the lungs every 6 (six) hours as needed for wheezing or shortness of breath.  Dispense: 18 g; Refill: 0 ? ?3. Folliculitis ?- clindamycin (CLINDAGEL) 1 % gel; Apply topically 2 (two) times daily.  Dispense: 30 g; Refill: 1 ? ?4. Moderate mixed hyperlipidemia not requiring statin therapy ?- Lipid panel ? ?5. Abnormal TSH ?- TSH ?- T3 ?- T4, free ? ?6. Prediabetes ?- Comp. Metabolic Panel (12) ?- POCT glycosylated hemoglobin (Hb A1C) ? ?7. Gastroesophageal reflux disease without esophagitis ?- omeprazole (PRILOSEC) 40 MG capsule; Take 1 capsule (40 mg total) by mouth daily.  Dispense: 30 capsule; Refill: 3 ? ?8. Pruritus ?- hydrOXYzine (VISTARIL) 50 MG capsule; Take 1 capsule (50 mg total) by mouth 3 (three) times daily as needed.  Dispense: 30 capsule; Refill: 11 ?- triamcinolone (KENALOG) 0.025 % ointment; Apply topically 2 (two) times daily.  Dispense: 30 g; Refill: 0 ?  ?Return in about 3 months (around 05/25/2022) for Follow up HTN [id number].  ?Please feel free to call our office, if you have any questions. ?Have a good day. ?Thad Ranger NP ? ? ?Prediabetes Eating Plan ?Prediabetes is a condition that causes blood sugar (glucose) levels to be higher than normal. This increases the risk for developing type 2 diabetes (type 2 diabetes mellitus). Working with a health care provider or nutrition specialist (dietitian) to make diet and lifestyle changes can help prevent the onset of diabetes. These changes may help you: ?Control your blood glucose levels. ?Improve your cholesterol levels. ?Manage your blood pressure. ?What are tips for following this plan? ?Reading food labels ?Read food labels to check the amount of fat, salt (sodium), and sugar in prepackaged foods. Avoid foods that have: ?Saturated  fats. ?Trans fats. ?Added sugars. ?Avoid foods that have more than 300 milligrams (mg) of sodium per serving. Limit your sodium intake to less than 2,300 mg each day. ?Shopping ?Avoid buying pre-made and processed foods. ?Avoid buying drinks with added sugar. ?Cooking ?Cook with olive oil. Do not use butter, lard, or ghee. ?Bake, broil, grill, steam, or boil foods. Avoid frying. ?Meal planning ? ?Work with your dietitian to create an eating plan that is right for you. This may include tracking how many calories you take in each day. Use a food diary, notebook, or mobile application to track what you eat at each meal. ?Consider following a Mediterranean diet. This includes: ?Eating several servings of fresh fruits and vegetables each day. ?Eating fish at least twice a week. ?Eating one serving each day of whole grains, beans, nuts, and seeds. ?Using olive oil instead of other fats. ?Limiting alcohol. ?Limiting red meat. ?Using nonfat or low-fat dairy products. ?Consider following a plant-based diet. This includes dietary choices that focus on eating mostly vegetables and fruit, grains, beans, nuts, and seeds. ?If you have high blood pressure, you may need to limit your sodium intake or follow a diet such as the DASH (Dietary Approaches to Stop Hypertension) eating plan. The DASH diet aims to lower high blood pressure. ?Lifestyle ?Set weight loss goals with help from your health care team. It is recommended that most people with prediabetes lose 7% of their body weight. ?Exercise for at least 30 minutes 5 or more days a week. ?Attend a support group or seek support from a mental health  counselor. ?Take over-the-counter and prescription medicines only as told by your health care provider. ?What foods are recommended? ?Fruits ?Berries. Bananas. Apples. Oranges. Grapes. Papaya. Mango. Pomegranate. Kiwi. Grapefruit. Cherries. ?Vegetables ?Lettuce. Spinach. Peas. Beets. Cauliflower. Cabbage. Broccoli. Carrots. Tomatoes.  Squash. Eggplant. Herbs. Peppers. Onions. Cucumbers. Brussels sprouts. ?Grains ?Whole grains, such as whole-wheat or whole-grain breads, crackers, cereals, and pasta. Unsweetened oatmeal. Bulgur. Barley. Quinoa. Brown rice. Corn or whole-wheat flour tortillas or taco shells. ?Meats and other proteins ?Seafood. Poultry without skin. Lean cuts of pork and beef. Tofu. Eggs. Nuts. Beans. ?Dairy ?Low-fat or fat-free dairy products, such as yogurt, cottage cheese, and cheese. ?Beverages ?Water. Tea. Coffee. Sugar-free or diet soda. Seltzer water. Low-fat or nonfat milk. Milk alternatives, such as soy or almond milk. ?Fats and oils ?Olive oil. Canola oil. Sunflower oil. Grapeseed oil. Avocado. Walnuts. ?Sweets and desserts ?Sugar-free or low-fat pudding. Sugar-free or low-fat ice cream and other frozen treats. ?Seasonings and condiments ?Herbs. Sodium-free spices. Mustard. Relish. Low-salt, low-sugar ketchup. Low-salt, low-sugar barbecue sauce. Low-fat or fat-free mayonnaise. ?The items listed above may not be a complete list of recommended foods and beverages. Contact a dietitian for more information. ?What foods are not recommended? ?Fruits ?Fruits canned with syrup. ?Vegetables ?Canned vegetables. Frozen vegetables with butter or cream sauce. ?Grains ?Refined white flour and flour products, such as bread, pasta, snack foods, and cereals. ?Meats and other proteins ?Fatty cuts of meat. Poultry with skin. Breaded or fried meat. Processed meats. ?Dairy ?Full-fat yogurt, cheese, or milk. ?Beverages ?Sweetened drinks, such as iced tea and soda. ?Fats and oils ?Butter. Lard. Ghee. ?Sweets and desserts ?Baked goods, such as cake, cupcakes, pastries, cookies, and cheesecake. ?Seasonings and condiments ?Spice mixes with added salt. Ketchup. Barbecue sauce. Mayonnaise. ?The items listed above may not be a complete list of foods and beverages that are not recommended. Contact a dietitian for more information. ?Where to find more  information ?American Diabetes Association: www.diabetes.org ?Summary ?You may need to make diet and lifestyle changes to help prevent the onset of diabetes. These changes can help you control blood sugar, improve cholesterol levels, and manage blood pressure. ?Set weight loss goals with help from your health care team. It is recommended that most people with prediabetes lose 7% of their body weight. ?Consider following a Mediterranean diet. This includes eating plenty of fresh fruits and vegetables, whole grains, beans, nuts, seeds, fish, and low-fat dairy, and using olive oil instead of other fats. ?This information is not intended to replace advice given to you by your health care provider. Make sure you discuss any questions you have with your health care provider. ?Document Revised: 02/18/2020 Document Reviewed: 02/18/2020 ?Elsevier Patient Education ? 2022 [employer] ? ?

## 2022-02-22 NOTE — Progress Notes (Signed)
[facility] Patient [facility] [address] [place], [place]  [address] Phone:  [phone]   Fax:  [phone]   Established Patient Office Visit  Subjective:  Patient ID: [place], male    DOB: 1975/12/11  Age: 46 y.o. MRN: [id number]  CC:  Chief Complaint  Patient presents with   Follow-up    Patient is here today for his follow up visit and to discuss the rash on his left forearm x 2 weeks. Patient also states that his Hydroxyzine was sent to the wrong pharmacy and needs it sent to [facility] pharmacy.     HPI [place] presents for follow up. He  has a past medical history of ALLERGIC RHINITIS (08/27/2007), Allergy, and GERD (05/04/2008).   He is in today for a 3 month follow up. He endorses dietary changes to improve weight and has had an 8 pound weight loss. He has a diagnoses is of prediabetes. He reports the long discussion that we had at his last visit was beneficial .  Denies fever, chills, headache, dizziness, visual changes, polydipsia,  polyphagia shortness of breath, dyspnea on exertion, chest pain, nausea, vomiting, polyuria, constipation, diarrhea, or any edema.   He is currently using hydroxyzine 25 mg for pruritis. He feels like this is effective ; however with the current allergy season is requesting an adjustment in the dose.   He is also requesting refill on other medications. He is prescribed omeprazole 25 mg 2 capsules daily. This dose is effective for his GERD.   Past Medical History:  Diagnosis Date   ALLERGIC RHINITIS 08/27/2007   Allergy    GERD 05/04/2008    Past Surgical History:  Procedure Laterality Date   APPENDECTOMY      Family History  Problem Relation Age of Onset   Multiple myeloma Mother    Diabetes Mother    Colon cancer Neg Hx    Esophageal cancer Neg Hx    Stomach cancer Neg Hx    Pancreatic cancer Neg Hx    Liver disease Neg Hx    Rectal cancer Neg Hx     Social History   Socioeconomic History    Marital status: Single    Spouse name: Not on file   Number of children: Not on file   Years of education: Not on file   Highest education level: Not on file  Occupational History   Not on file  Tobacco Use   Smoking status: Never   Smokeless tobacco: Never  Vaping Use   Vaping Use: Never used  Substance and Sexual Activity   Alcohol use: Not Currently    Comment: social , liquor-- 1 drink every month to 2 months   Drug use: No   Sexual activity: Not Currently    Partners: Female, Male    Birth control/protection: Condom    Comment: pt given condoms  Other Topics Concern   Not on file  Social History Narrative   Not on file   Social Determinants of Health   Financial Resource Strain: Not on file  Food Insecurity: Not on file  Transportation Needs: Not on file  Physical Activity: Not on file  Stress: Not on file  Social Connections: Not on file  Intimate Partner Violence: Not on file    Outpatient Medications Prior to Visit  Medication Sig Dispense Refill   BIKTARVY 50-200-25 MG TABS tablet TAKE 1 TABLET BY MOUTH DAILY 30 tablet 2   Blood Glucose Monitoring Suppl (TRUE  METRIX METER) w/Device KIT Use as directed up to 4 times daily. 1 kit 0   multivitamin (ONE-A-DAY MEN'S) TABS tablet Take 1 tablet by mouth daily. 100 tablet 0   tiZANidine (ZANAFLEX) 4 MG tablet Take 1 tablet by mouth twice daily as needed 60 tablet 0   valACYclovir (VALTREX) 500 MG tablet TAKE 1 TABLET BY MOUTH DAILY 30 tablet 6   albuterol (VENTOLIN HFA) 108 (90 Base) MCG/ACT inhaler Inhale 1-2 puffs into the lungs every 6 (six) hours as needed for wheezing or shortness of breath. 18 g 0   clindamycin (CLINDAGEL) 1 % gel Apply topically 2 (two) times daily. 30 g 1   hydrOXYzine (ATARAX) 25 MG tablet Take 1 tablet (25 mg total) by mouth every 8 (eight) hours as needed. 90 tablet 1   omeprazole (PRILOSEC) 20 MG capsule TAKE 2 CAPSULES BY MOUTH EVERY MORNING BEFORE EATING 90 capsule 3   triamcinolone  (KENALOG) 0.025 % ointment Apply topically 2 (two) times daily. 30 g 0   meloxicam (MOBIC) 7.5 MG tablet Take one tablet by mouth daily (Patient not taking: Reported on 02/22/2022) 30 tablet 1   No facility-administered medications prior to visit.    Allergies  Allergen Reactions   Azithromycin Itching    ROS Review of Systems    Objective:    Physical Exam Constitutional:      Appearance: He is obese.     Comments: Weight is improving down eight pounds   HENT:     Head: Normocephalic and atraumatic.     Nose: Nose normal.     Mouth/Throat:     Mouth: Mucous membranes are moist.  Cardiovascular:     Rate and Rhythm: Normal rate and regular rhythm.     Pulses: Normal pulses.     Heart sounds: Normal heart sounds.  Pulmonary:     Effort: Pulmonary effort is normal.     Breath sounds: Normal breath sounds.  Abdominal:     Palpations: Abdomen is soft.     Comments: Increased abdominal girth hypoactive  Musculoskeletal:        General: Normal range of motion.     Cervical back: Normal range of motion.     Right lower leg: No edema.     Left lower leg: No edema.  Skin:    General: Skin is warm and dry.     Capillary Refill: Capillary refill takes less than 2 seconds.  Neurological:     General: No focal deficit present.     Mental Status: He is alert.  Psychiatric:        Mood and Affect: Mood normal.        Behavior: Behavior normal.        Thought Content: Thought content normal.        Judgment: Judgment normal.    BP 117/90   Pulse 80   Temp 97.6 F (36.4 C)   Ht 6\' 1"  (1.854 m)   Wt 266 lb 9.6 oz (120.9 kg)   SpO2 100%   BMI 35.17 kg/m  Wt Readings from Last 3 Encounters:  02/22/22 266 lb 9.6 oz (120.9 kg)  11/17/21 274 lb 0.2 oz (124.3 kg)  08/10/21 275 lb (124.7 kg)     Health Maintenance Due  Topic Date Due   COVID-19 Vaccine (4 - Booster for Pfizer series) 01/03/2021   COLONOSCOPY (Pts 45-51yrs Insurance coverage will need to be confirmed)   Never done    There are no preventive care  reminders to display for this patient.  Lab Results  Component Value Date   TSH 0.352 (L) 10/10/2018   Lab Results  Component Value Date   WBC 6.4 02/15/2022   HGB 13.0 (L) 02/15/2022   HCT 39.1 02/15/2022   MCV 90.1 02/15/2022   PLT 397 02/15/2022   Lab Results  Component Value Date   NA 140 02/15/2022   K 4.3 02/15/2022   CO2 25 02/15/2022   GLUCOSE 91 02/15/2022   BUN 12 02/15/2022   CREATININE 1.07 02/15/2022   BILITOT 0.3 02/15/2022   ALKPHOS 116 11/17/2021   AST 19 02/15/2022   ALT 19 02/15/2022   PROT 7.8 02/15/2022   ALBUMIN 4.3 11/17/2021   CALCIUM 9.7 02/15/2022   EGFR 87 02/15/2022   Lab Results  Component Value Date   CHOL 211 (H) 11/17/2021   Lab Results  Component Value Date   HDL 42 11/17/2021   Lab Results  Component Value Date   LDLCALC 103 (H) 11/17/2021   Lab Results  Component Value Date   TRIG 391 (H) 11/17/2021   Lab Results  Component Value Date   CHOLHDL 5.0 11/17/2021   Lab Results  Component Value Date   HGBA1C 5.9 02/22/2022      Assessment & Plan:   Problem List Items Addressed This Visit       Digestive   GERD Stable Continue with current regimen.  No changes warranted. Good patient compliance.    Relevant Medications   omeprazole (PRILOSEC) 40 MG capsule     Musculoskeletal and Integument   Folliculitis Stable Continue with current regimen.  No changes warranted. Good patient compliance.    Relevant Medications   clindamycin (CLINDAGEL) 1 % gel     Other   Human immunodeficiency virus (HIV) disease (HCC) Enourage patient to keep all follow-up visits with your infectious disease. Take your medicines as directed Get all recommended lab tests Encourage patient to make choices that keep you healthy and protect others.    Other Visit Diagnoses     Moderate mixed hyperlipidemia not requiring statin therapy    -  Primary Evaluation pending  Stable  Encouraged  on going compliance with current medication regimen. I recommend eating a heart-healthy diet; low in fat and cholesterol along with regular exercise. This will promote weight loss and improve cholesterol.     Relevant Orders   Lipid panel   Cough, unspecified type       Relevant Medications   albuterol (VENTOLIN HFA) 108 (90 Base) MCG/ACT inhaler   Abnormal TSH     Reevaluation pending    Relevant Orders   TSH   T3   T4, free   Prediabetes     Consider home glucose monitoring Weight loss at least 5% of current body weight is can be achieved with lifestyle modification dietary changes and regular daily exercise Encourage blood pressure control goal <120/80 and maintaining total cholesterol <200 Follow-up every 3 to 6 months for reevaluation Education material provided    Relevant Orders   Comp. Metabolic Panel (12)   POCT glycosylated hemoglobin (Hb A1C) (Completed)   Pruritus    Persistent     Relevant Medications   hydrOXYzine (VISTARIL) 50 MG capsule   triamcinolone (KENALOG) 0.025 % ointment       Meds ordered this encounter  Medications   hydrOXYzine (VISTARIL) 50 MG capsule    Sig: Take 1 capsule (50 mg total) by mouth 3 (three) times daily as needed.  Dispense:  30 capsule    Refill:  11    Order Specific Question:   Supervising Provider    Answer:   Quentin Angst [[id number]]   albuterol (VENTOLIN HFA) 108 (90 Base) MCG/ACT inhaler    Sig: Inhale 1-2 puffs into the lungs every 6 (six) hours as needed for wheezing or shortness of breath.    Dispense:  18 g    Refill:  0    Order Specific Question:   Supervising Provider    Answer:   Quentin Angst [[id number]]   clindamycin (CLINDAGEL) 1 % gel    Sig: Apply topically 2 (two) times daily.    Dispense:  30 g    Refill:  1    Order Specific Question:   Supervising Provider    Answer:   Quentin Angst [id number]   triamcinolone (KENALOG) 0.025 % ointment    Sig: Apply topically 2 (two) times daily.     Dispense:  30 g    Refill:  0    Order Specific Question:   Supervising Provider    Answer:   Quentin Angst [[id number]]   omeprazole (PRILOSEC) 40 MG capsule    Sig: Take 1 capsule (40 mg total) by mouth daily.    Dispense:  30 capsule    Refill:  3    Order Specific Question:   Supervising Provider    Answer:   Quentin Angst [[id number]]    Follow-up: Return in about 3 months (around 05/25/2022) for follow up prediabetes & medication management [id number].    [name], NP

## 2022-02-23 LAB — LIPID PANEL
Chol/HDL Ratio: 4.2 ratio (ref 0.0–5.0)
Cholesterol, Total: 220 mg/dL — ABNORMAL HIGH (ref 100–199)
HDL: 52 mg/dL (ref >39)
LDL Chol Calc (NIH): 147 mg/dL — ABNORMAL HIGH (ref 0–99)
Triglycerides: 118 mg/dL (ref 0–149)
VLDL Cholesterol Cal: 21 mg/dL (ref 5–40)

## 2022-02-23 LAB — T4, FREE: Free T4: 1.11 ng/dL (ref 0.82–1.77)

## 2022-02-23 LAB — T3: T3, Total: 123 ng/dL (ref 71–180)

## 2022-02-23 LAB — COMP. METABOLIC PANEL (12)
AST: 22 IU/L (ref 0–40)
Albumin/Globulin Ratio: 1.6 (ref 1.2–2.2)
Albumin: 4.4 g/dL (ref 4.0–5.0)
Alkaline Phosphatase: 102 IU/L (ref 44–121)
BUN/Creatinine Ratio: 15 (ref 9–20)
BUN: 15 mg/dL (ref 6–24)
Bilirubin Total: 0.3 mg/dL (ref 0.0–1.2)
Calcium: 9.5 mg/dL (ref 8.7–10.2)
Chloride: 101 mmol/L (ref 96–106)
Creatinine, Ser: 1.03 mg/dL (ref 0.76–1.27)
Globulin, Total: 2.7 g/dL (ref 1.5–4.5)
Glucose: 102 mg/dL — ABNORMAL HIGH (ref 70–99)
Potassium: 4.5 mmol/L (ref 3.5–5.2)
Sodium: 143 mmol/L (ref 134–144)
Total Protein: 7.1 g/dL (ref 6.0–8.5)
eGFR: 91 mL/min/{1.73_m2} (ref >59)

## 2022-02-23 LAB — TSH: TSH: 0.948 u[IU]/mL (ref 0.450–4.500)

## 2022-02-28 ENCOUNTER — Other Ambulatory Visit: Payer: Self-pay

## 2022-03-02 ENCOUNTER — Other Ambulatory Visit: Payer: Self-pay

## 2022-03-05 ENCOUNTER — Other Ambulatory Visit: Payer: Self-pay

## 2022-03-05 ENCOUNTER — Ambulatory Visit (INDEPENDENT_AMBULATORY_CARE_PROVIDER_SITE_OTHER): Payer: Self-pay | Admitting: Internal Medicine

## 2022-03-05 ENCOUNTER — Encounter: Payer: Self-pay | Admitting: Internal Medicine

## 2022-03-05 VITALS — BP 113/78 | HR 77 | Temp 98.6°F | Ht 73.0 in | Wt 267.0 lb

## 2022-03-05 DIAGNOSIS — B20 Human immunodeficiency virus [HIV] disease: Secondary | ICD-10-CM

## 2022-03-05 DIAGNOSIS — R300 Dysuria: Secondary | ICD-10-CM

## 2022-03-05 DIAGNOSIS — A6001 Herpesviral infection of penis: Secondary | ICD-10-CM

## 2022-03-05 MED ORDER — VALACYCLOVIR HCL 500 MG PO TABS: 500.0000 mg | ORAL_TABLET | Freq: Every day | ORAL | 11 refills | Status: DC

## 2022-03-05 MED ORDER — BIKTARVY 50-200-25 MG PO TABS: 1.0000 | ORAL_TABLET | Freq: Every day | ORAL | 11 refills | Status: DC
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
## 2022-03-06 LAB — URINALYSIS, ROUTINE W REFLEX MICROSCOPIC
Bilirubin Urine: NEGATIVE
Glucose, UA: NEGATIVE
Hgb urine dipstick: NEGATIVE
Ketones, ur: NEGATIVE
Leukocytes,Ua: NEGATIVE
Nitrite: NEGATIVE
Protein, ur: NEGATIVE
Specific Gravity, Urine: 1.01 (ref 1.001–1.035)
pH: 6 (ref 5.0–8.0)

## 2022-03-06 LAB — URINE CULTURE
MICRO NUMBER:: 13216126
Result:: NO GROWTH
SPECIMEN QUALITY:: ADEQUATE

## 2022-03-06 LAB — URINE CYTOLOGY ANCILLARY ONLY
Chlamydia: NEGATIVE
Comment: NEGATIVE
Comment: NORMAL
Neisseria Gonorrhea: NEGATIVE

## 2022-03-09 ENCOUNTER — Other Ambulatory Visit: Payer: Self-pay

## 2022-03-20 ENCOUNTER — Other Ambulatory Visit: Payer: Self-pay

## 2022-03-21 ENCOUNTER — Other Ambulatory Visit: Payer: Self-pay

## 2022-03-21 MED ORDER — SODIUM FLUORIDE 1.1 % DT CREA
TOPICAL_CREAM | DENTAL | 6 refills | Status: DC
  Filled 2022-03-21: qty 51, 30d supply, fill #0
  Filled 2022-05-08: qty 51, 30d supply, fill #1
  Filled 2022-06-25 – 2022-07-02 (×2): qty 51, 30d supply, fill #2

## 2022-03-22 ENCOUNTER — Other Ambulatory Visit: Payer: Self-pay

## 2022-03-22 ENCOUNTER — Telehealth: Payer: Self-pay

## 2022-03-22 ENCOUNTER — Other Ambulatory Visit: Payer: Self-pay | Admitting: Nurse Practitioner

## 2022-03-22 DIAGNOSIS — K219 Gastro-esophageal reflux disease without esophagitis: Secondary | ICD-10-CM

## 2022-03-22 MED ORDER — OMEPRAZOLE 40 MG PO CPDR
40.0000 mg | DELAYED_RELEASE_CAPSULE | Freq: Every day | ORAL | 0 refills | Status: DC
  Filled 2022-03-22: qty 90, 90d supply, fill #0

## 2022-03-23 ENCOUNTER — Other Ambulatory Visit: Payer: Self-pay

## 2022-03-26 ENCOUNTER — Other Ambulatory Visit: Payer: Self-pay

## 2022-03-28 NOTE — Telephone Encounter (Signed)
No additional notes needed  

## 2022-05-08 ENCOUNTER — Other Ambulatory Visit: Payer: Self-pay

## 2022-05-08 ENCOUNTER — Other Ambulatory Visit: Payer: Self-pay | Admitting: Nurse Practitioner

## 2022-05-08 DIAGNOSIS — R059 Cough, unspecified: Secondary | ICD-10-CM

## 2022-05-08 DIAGNOSIS — L299 Pruritus, unspecified: Secondary | ICD-10-CM

## 2022-05-08 MED ORDER — ALBUTEROL SULFATE HFA 108 (90 BASE) MCG/ACT IN AERS
1.0000 | INHALATION_SPRAY | Freq: Four times a day (QID) | RESPIRATORY_TRACT | 0 refills | Status: DC | PRN
  Filled 2022-05-08: qty 18, 25d supply, fill #0

## 2022-05-08 MED ORDER — TRIAMCINOLONE ACETONIDE 0.025 % EX OINT
TOPICAL_OINTMENT | Freq: Two times a day (BID) | CUTANEOUS | 0 refills | Status: DC
  Filled 2022-05-08: qty 30, 15d supply, fill #0

## 2022-05-09 ENCOUNTER — Encounter: Payer: Self-pay | Admitting: Nurse Practitioner

## 2022-05-09 ENCOUNTER — Other Ambulatory Visit: Payer: Self-pay | Admitting: Nurse Practitioner

## 2022-05-09 ENCOUNTER — Telehealth: Payer: Self-pay | Admitting: Nurse Practitioner

## 2022-05-09 ENCOUNTER — Ambulatory Visit (INDEPENDENT_AMBULATORY_CARE_PROVIDER_SITE_OTHER): Payer: Self-pay | Admitting: Nurse Practitioner

## 2022-05-09 ENCOUNTER — Other Ambulatory Visit: Payer: Self-pay

## 2022-05-09 VITALS — BP 122/82 | HR 85 | Temp 97.3°F | Ht 73.0 in | Wt 268.0 lb

## 2022-05-09 DIAGNOSIS — K219 Gastro-esophageal reflux disease without esophagitis: Secondary | ICD-10-CM

## 2022-05-09 DIAGNOSIS — L739 Follicular disorder, unspecified: Secondary | ICD-10-CM

## 2022-05-09 DIAGNOSIS — G8929 Other chronic pain: Secondary | ICD-10-CM

## 2022-05-09 DIAGNOSIS — A6001 Herpesviral infection of penis: Secondary | ICD-10-CM

## 2022-05-09 DIAGNOSIS — M545 Low back pain, unspecified: Secondary | ICD-10-CM

## 2022-05-09 DIAGNOSIS — H6690 Otitis media, unspecified, unspecified ear: Secondary | ICD-10-CM

## 2022-05-09 DIAGNOSIS — L299 Pruritus, unspecified: Secondary | ICD-10-CM

## 2022-05-09 MED ORDER — MOXIFLOXACIN HCL 0.5 % OP SOLN
1.0000 [drp] | Freq: Three times a day (TID) | OPHTHALMIC | 0 refills | Status: DC
  Filled 2022-05-09: qty 3, 20d supply, fill #0

## 2022-05-09 MED ORDER — OMEPRAZOLE 20 MG PO CPDR
20.0000 mg | DELAYED_RELEASE_CAPSULE | Freq: Every day | ORAL | 2 refills | Status: DC
  Filled 2022-05-09: qty 30, 30d supply, fill #0

## 2022-05-09 MED ORDER — TRIAMCINOLONE ACETONIDE 0.025 % EX OINT
TOPICAL_OINTMENT | Freq: Two times a day (BID) | CUTANEOUS | 0 refills | Status: DC
  Filled 2022-05-09: qty 30, 15d supply, fill #0

## 2022-05-09 MED ORDER — AMOXICILLIN-POT CLAVULANATE 875-125 MG PO TABS
1.0000 | ORAL_TABLET | Freq: Two times a day (BID) | ORAL | 0 refills | Status: DC
  Filled 2022-05-09: qty 20, 10d supply, fill #0

## 2022-05-09 MED ORDER — TIZANIDINE HCL 4 MG PO TABS
ORAL_TABLET | ORAL | 0 refills | Status: DC
  Filled 2022-05-09: qty 60, 30d supply, fill #0

## 2022-05-09 MED ORDER — CLINDAMYCIN PHOSPHATE 1 % EX GEL
Freq: Two times a day (BID) | CUTANEOUS | 1 refills | Status: AC
  Filled 2022-06-25: qty 30, 15d supply, fill #0
  Filled 2022-07-02: qty 60, 30d supply, fill #0

## 2022-05-09 MED ORDER — MELOXICAM 7.5 MG PO TABS
ORAL_TABLET | ORAL | 1 refills | Status: DC
  Filled 2022-05-09: qty 30, 30d supply, fill #0
  Filled 2022-06-25 – 2022-07-02 (×2): qty 30, 30d supply, fill #1

## 2022-05-09 MED ORDER — VALACYCLOVIR HCL 500 MG PO TABS
500.0000 mg | ORAL_TABLET | Freq: Every day | ORAL | 11 refills | Status: DC
  Filled 2022-05-09: qty 30, 30d supply, fill #0
  Filled 2022-06-25: qty 30, 30d supply, fill #1

## 2022-05-09 MED ORDER — POLYMYXIN B-TRIMETHOPRIM 10000-0.1 UNIT/ML-% OP SOLN
1.0000 [drp] | Freq: Four times a day (QID) | OPHTHALMIC | 0 refills | Status: DC
  Filled 2022-05-09: qty 10, 20d supply, fill #0

## 2022-05-09 MED ORDER — OMEPRAZOLE 40 MG PO CPDR
40.0000 mg | DELAYED_RELEASE_CAPSULE | Freq: Every day | ORAL | 0 refills | Status: DC
  Filled 2022-05-09 – 2022-06-25 (×2): qty 90, 90d supply, fill #0
  Filled 2022-07-02: qty 30, 30d supply, fill #0

## 2022-05-09 NOTE — Telephone Encounter (Signed)
Pt came into today for pink eye. A script for eye drops was sent into the pharmacy for him, but patient called stating the pharm will be out of those drops until tomorrow. Pt wondering if a different script for something available today could be sent in?
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
## 2022-05-09 NOTE — Telephone Encounter (Signed)
OK. Thanks. I sent in an alternative.

## 2022-05-09 NOTE — Assessment & Plan Note (Signed)
-   clindamycin (CLINDAGEL) 1 % gel; Apply topically 2 (two) times daily.  Dispense: 30 g; Refill: 1  2. Pruritus  - triamcinolone (KENALOG) 0.025 % ointment; Apply topically 2 (two) times daily.  Dispense: 30 g; Refill: 0  3. Herpes simplex infection of penis  - valACYclovir (VALTREX) 500 MG tablet; Take 1 tablet (500 mg total) by mouth daily.  Dispense: 30 tablet; Refill: 11  4. Gastroesophageal reflux disease without esophagitis  - omeprazole (PRILOSEC) 40 MG capsule; Take 1 capsule (40 mg total) by mouth daily.  Dispense: 90 capsule; Refill: 0  5. Gastroesophageal reflux disease, unspecified whether esophagitis present  - omeprazole (PRILOSEC) 20 MG capsule; Take 1 capsule (20 mg total) by mouth daily.  Dispense: 30 capsule; Refill: 2  6. Acute otitis media, unspecified otitis media type  - amoxicillin-clavulanate (AUGMENTIN) 875-125 MG tablet; Take 1 tablet by mouth 2 (two) times daily.  Dispense: 20 tablet; Refill: 0  7. Chronic bilateral low back pain without sciatica  - Ambulatory referral to Physical Medicine Rehab  Follow up:  Follow up in 6 months

## 2022-05-09 NOTE — Patient Instructions (Signed)
1. Folliculitis  - clindamycin (CLINDAGEL) 1 % gel; Apply topically 2 (two) times daily.  Dispense: 30 g; Refill: 1  2. Pruritus  - triamcinolone (KENALOG) 0.025 % ointment; Apply topically 2 (two) times daily.  Dispense: 30 g; Refill: 0  3. Herpes simplex infection of penis  - valACYclovir (VALTREX) 500 MG tablet; Take 1 tablet (500 mg total) by mouth daily.  Dispense: 30 tablet; Refill: 11  4. Gastroesophageal reflux disease without esophagitis  - omeprazole (PRILOSEC) 40 MG capsule; Take 1 capsule (40 mg total) by mouth daily.  Dispense: 90 capsule; Refill: 0  5. Gastroesophageal reflux disease, unspecified whether esophagitis present  - omeprazole (PRILOSEC) 20 MG capsule; Take 1 capsule (20 mg total) by mouth daily.  Dispense: 30 capsule; Refill: 2  6. Acute otitis media, unspecified otitis media type  - amoxicillin-clavulanate (AUGMENTIN) 875-125 MG tablet; Take 1 tablet by mouth 2 (two) times daily.  Dispense: 20 tablet; Refill: 0  7. Chronic bilateral low back pain without sciatica  - Ambulatory referral to Physical Medicine Rehab  Follow up:  Follow up in 6 months

## 2022-05-10 ENCOUNTER — Other Ambulatory Visit: Payer: Self-pay

## 2022-05-14 ENCOUNTER — Telehealth: Payer: Self-pay | Admitting: Nurse Practitioner

## 2022-05-14 NOTE — Telephone Encounter (Signed)
Patient LVM on main line stating that the eye drops prescribed recently are not helping the redness in his eyes. Tried to call pt to let him know this can be discussed at appt on 6/13, but no answer.

## 2022-05-15 ENCOUNTER — Ambulatory Visit: Payer: Self-pay | Admitting: Nurse Practitioner

## 2022-05-25 ENCOUNTER — Telehealth: Payer: Self-pay

## 2022-05-25 ENCOUNTER — Other Ambulatory Visit: Payer: Self-pay

## 2022-05-25 NOTE — Telephone Encounter (Signed)
Patient is asking for a refill on the Vigamox eyedrops

## 2022-05-25 NOTE — Telephone Encounter (Signed)
Left a detailed message for the patient.  

## 2022-06-01 ENCOUNTER — Ambulatory Visit: Payer: Self-pay | Admitting: Nurse Practitioner

## 2022-06-08 ENCOUNTER — Ambulatory Visit: Payer: Self-pay | Admitting: Physical Medicine & Rehabilitation

## 2022-06-25 ENCOUNTER — Other Ambulatory Visit: Payer: Self-pay

## 2022-06-25 ENCOUNTER — Telehealth: Payer: Self-pay

## 2022-06-25 ENCOUNTER — Other Ambulatory Visit: Payer: Self-pay | Admitting: Nurse Practitioner

## 2022-06-25 DIAGNOSIS — A6001 Herpesviral infection of penis: Secondary | ICD-10-CM

## 2022-06-25 DIAGNOSIS — R059 Cough, unspecified: Secondary | ICD-10-CM

## 2022-06-25 DIAGNOSIS — L299 Pruritus, unspecified: Secondary | ICD-10-CM

## 2022-06-25 MED ORDER — ALBUTEROL SULFATE HFA 108 (90 BASE) MCG/ACT IN AERS
1.0000 | INHALATION_SPRAY | Freq: Four times a day (QID) | RESPIRATORY_TRACT | 0 refills | Status: DC | PRN
  Filled 2022-06-25 – 2022-07-02 (×2): qty 18, 25d supply, fill #0

## 2022-06-25 MED ORDER — TIZANIDINE HCL 4 MG PO TABS
ORAL_TABLET | ORAL | 0 refills | Status: DC
  Filled 2022-06-25 – 2022-07-02 (×2): qty 60, 30d supply, fill #0

## 2022-06-25 MED ORDER — VALACYCLOVIR HCL 500 MG PO TABS: 500.0000 mg | ORAL_TABLET | Freq: Every day | ORAL | 11 refills | Status: DC

## 2022-06-25 NOTE — Telephone Encounter (Signed)
No additional notes needed  

## 2022-06-26 NOTE — Telephone Encounter (Signed)
No additional notes

## 2022-06-28 ENCOUNTER — Ambulatory Visit: Payer: Self-pay | Admitting: Nurse Practitioner

## 2022-06-29 ENCOUNTER — Other Ambulatory Visit: Payer: Self-pay

## 2022-07-02 ENCOUNTER — Other Ambulatory Visit: Payer: Self-pay

## 2022-07-02 ENCOUNTER — Ambulatory Visit: Payer: Self-pay

## 2022-07-04 ENCOUNTER — Other Ambulatory Visit: Payer: Self-pay

## 2022-07-04 ENCOUNTER — Encounter: Payer: Self-pay | Admitting: Internal Medicine

## 2022-07-11 ENCOUNTER — Ambulatory Visit: Payer: Self-pay | Admitting: Nurse Practitioner

## 2022-07-20 ENCOUNTER — Encounter: Payer: Self-pay | Admitting: Nurse Practitioner

## 2022-07-20 ENCOUNTER — Ambulatory Visit (INDEPENDENT_AMBULATORY_CARE_PROVIDER_SITE_OTHER): Payer: Self-pay | Admitting: Nurse Practitioner

## 2022-07-20 ENCOUNTER — Other Ambulatory Visit: Payer: Self-pay

## 2022-07-20 VITALS — BP 131/101 | HR 94 | Temp 99.0°F | Wt 273.2 lb

## 2022-07-20 DIAGNOSIS — J069 Acute upper respiratory infection, unspecified: Secondary | ICD-10-CM

## 2022-07-20 DIAGNOSIS — Z113 Encounter for screening for infections with a predominantly sexual mode of transmission: Secondary | ICD-10-CM

## 2022-07-20 DIAGNOSIS — J029 Acute pharyngitis, unspecified: Secondary | ICD-10-CM

## 2022-07-20 LAB — POCT URINALYSIS DIP (CLINITEK)
Bilirubin, UA: NEGATIVE
Blood, UA: NEGATIVE
Glucose, UA: NEGATIVE mg/dL
Ketones, POC UA: NEGATIVE mg/dL
Leukocytes, UA: NEGATIVE
Nitrite, UA: NEGATIVE
POC PROTEIN,UA: NEGATIVE
Spec Grav, UA: 1.025 (ref 1.010–1.025)
Urobilinogen, UA: 0.2 E.U./dL
pH, UA: 7 (ref 5.0–8.0)

## 2022-07-20 LAB — POCT GLYCOSYLATED HEMOGLOBIN (HGB A1C)
HbA1c POC (<> result, manual entry): 6.3 % (ref 4.0–5.6)
HbA1c, POC (controlled diabetic range): 6.3 % (ref 0.0–7.0)
HbA1c, POC (prediabetic range): 6.3 % (ref 5.7–6.4)
Hemoglobin A1C: 6.3 % — AB (ref 4.0–5.6)

## 2022-07-20 MED ORDER — AMOXICILLIN 875 MG PO TABS
875.0000 mg | ORAL_TABLET | Freq: Two times a day (BID) | ORAL | 0 refills | Status: AC
  Filled 2022-07-20: qty 20, 10d supply, fill #0

## 2022-07-20 MED ORDER — MELOXICAM 7.5 MG PO TABS
7.5000 mg | ORAL_TABLET | Freq: Every day | ORAL | 0 refills | Status: DC | PRN
  Filled 2022-07-20: qty 30, 30d supply, fill #0

## 2022-07-20 MED ORDER — PREDNISONE 20 MG PO TABS
20.0000 mg | ORAL_TABLET | Freq: Every day | ORAL | 0 refills | Status: AC
  Filled 2022-07-20: qty 5, 5d supply, fill #0

## 2022-07-20 MED ORDER — CETIRIZINE HCL 10 MG PO TABS
10.0000 mg | ORAL_TABLET | Freq: Every day | ORAL | 11 refills | Status: DC
  Filled 2022-07-20: qty 30, 30d supply, fill #0

## 2022-07-20 NOTE — Patient Instructions (Signed)
1. Pharyngitis, unspecified etiology  - predniSONE (DELTASONE) 20 MG tablet; Take 1 tablet (20 mg total) by mouth daily with breakfast for 5 days.  Dispense: 5 tablet; Refill: 0 - amoxicillin (AMOXIL) 875 MG tablet; Take 1 tablet (875 mg total) by mouth 2 (two) times daily for 10 days.  Dispense: 20 tablet; Refill: 0 - cetirizine (ZYRTEC) 10 MG tablet; Take 1 tablet (10 mg total) by mouth daily.  Dispense: 30 tablet; Refill: 11 - POCT glycosylated hemoglobin (Hb A1C) - POCT URINALYSIS DIP (CLINITEK)  2. Upper respiratory tract infection, unspecified type  - predniSONE (DELTASONE) 20 MG tablet; Take 1 tablet (20 mg total) by mouth daily with breakfast for 5 days.  Dispense: 5 tablet; Refill: 0 - amoxicillin (AMOXIL) 875 MG tablet; Take 1 tablet (875 mg total) by mouth 2 (two) times daily for 10 days.  Dispense: 20 tablet; Refill: 0 - cetirizine (ZYRTEC) 10 MG tablet; Take 1 tablet (10 mg total) by mouth daily.  Dispense: 30 tablet; Refill: 11 - POCT glycosylated hemoglobin (Hb A1C) - POCT URINALYSIS DIP (CLINITEK)  3. Screening examination for STD (sexually transmitted disease)  - RPR+HIV+GC+CT Panel  Follow up:  Follow up in 3 months or sooner if needed

## 2022-07-20 NOTE — Progress Notes (Signed)
@Patient  ID: Todd Carroll, male    DOB: [birth year], 46 y.o.   MRN: [id number]  Chief Complaint  Patient presents with   Sore Throat    Pt is here for sore throat,     Referring provider: Fenton Foy, NP   HPI  Todd Carroll presents for follow up. He  has a past medical history of ALLERGIC RHINITIS (08/27/2007), Allergy, and GERD (05/04/2008).   Patient presents today for sick visit. He states that he has hoarse voice, cough, and low grade fever for a few weeks. Home covid test negative. Also complains of red patches to bilateral legs and face. Post nasal drip. Patient also complains of burning with urination. Denies n/v/d, hemoptysis, PND, leg swelling Denies chest pain or edema     Allergies  Allergen Reactions   Azithromycin Itching    Immunization History  Administered Date(s) Administered   HPV 9-valent 05/15/2018, 06/13/2018   Hepatitis A 04/02/2013   Hepatitis A, Adult 10/23/2013   Hepatitis B 04/02/2013, 06/02/2013   Hepatitis B, adult 01/05/2014   Influenza Split 09/14/2011   Influenza,inj,Quad PF,6+ Mos 10/23/2013, 11/01/2014, 11/15/2015, 08/31/2016, 08/31/2016, 07/24/2017, 09/11/2018, 09/16/2020, 11/02/2021   Meningococcal Mcv4o 03/06/2017, 09/12/2018   PFIZER(Purple Top)SARS-COV-2 Vaccination 03/23/2020, 04/12/2020, 11/08/2020   PPD Test 03/19/2013   Pneumococcal Conjugate-13 05/15/2018   Pneumococcal Polysaccharide-23 03/19/2013, 12/31/2018   Td 12/03/2006   Tdap 07/24/2017   Vaccinia,smallpox Monkeypox Vaccine Live,pf 08/03/2021    Past Medical History:  Diagnosis Date   ALLERGIC RHINITIS 08/27/2007   Allergy    GERD 05/04/2008    Tobacco History: Social History   Tobacco Use  Smoking Status Never  Smokeless Tobacco Never   Counseling given: Not Answered   Outpatient Encounter Medications as of 07/20/2022  Medication Sig   albuterol (VENTOLIN HFA) 108 (90 Base) MCG/ACT inhaler Inhale 1-2 puffs into the lungs every 6 (six) hours  as needed for wheezing or shortness of breath.   amoxicillin (AMOXIL) 875 MG tablet Take 1 tablet (875 mg total) by mouth 2 (two) times daily for 10 days.   bictegravir-emtricitabine-tenofovir AF (BIKTARVY) 50-200-25 MG TABS tablet Take 1 tablet by mouth daily.   Blood Glucose Monitoring Suppl (TRUE METRIX METER) w/Device KIT Use as directed up to 4 times daily.   cetirizine (ZYRTEC) 10 MG tablet Take 1 tablet (10 mg total) by mouth daily.   clindamycin (CLINDAGEL) 1 % gel Apply topically 2 (two) times daily.   hydrOXYzine (VISTARIL) 50 MG capsule Take 1 capsule (50 mg total) by mouth 3 (three) times daily as needed.   meloxicam (MOBIC) 7.5 MG tablet Take 1 tablet (7.5 mg total) by mouth daily as needed for pain.   multivitamin (ONE-A-DAY MEN'S) TABS tablet Take 1 tablet by mouth daily.   omeprazole (PRILOSEC) 40 MG capsule Take 1 capsule (40 mg total) by mouth daily.   predniSONE (DELTASONE) 20 MG tablet Take 1 tablet (20 mg total) by mouth daily with breakfast for 5 days.   sodium fluoride (PREVIDENT 5000 PLUS) 1.1 % CREA dental cream Use a pea sized amount of this toothpaste in the evening   tiZANidine (ZANAFLEX) 4 MG tablet Take 1 tablet by mouth twice daily as needed   triamcinolone (KENALOG) 0.025 % ointment Apply topically 2 (two) times daily.   valACYclovir (VALTREX) 500 MG tablet Take 1 tablet (500 mg total) by mouth daily.   [DISCONTINUED] meloxicam (MOBIC) 7.5 MG tablet Take one tablet by mouth daily   amoxicillin-clavulanate (AUGMENTIN) 875-125 MG tablet Take 1  tablet by mouth 2 (two) times daily. (Patient not taking: Reported on 07/20/2022)   moxifloxacin (VIGAMOX) 0.5 % ophthalmic solution Place 1 drop into both eyes 3 (three) times daily. (Patient not taking: Reported on 07/20/2022)   No facility-administered encounter medications on file as of 07/20/2022.     Review of Systems  Review of Systems  HENT:  Positive for congestion, postnasal drip and sore throat.   Genitourinary:   Positive for dysuria.       Physical Exam  BP (!) 131/101 (BP Location: Left Arm, Patient Position: Sitting, Cuff Size: Large)   Pulse 94   Temp 99 F (37.2 C)   Wt 273 lb 3.2 oz (123.9 kg)   SpO2 100%   BMI 36.04 kg/m   Wt Readings from Last 5 Encounters:  07/20/22 273 lb 3.2 oz (123.9 kg)  05/09/22 268 lb (121.6 kg)  03/05/22 267 lb (121.1 kg)  02/22/22 266 lb 9.6 oz (120.9 kg)  11/17/21 274 lb 0.2 oz (124.3 kg)     Physical Exam Vitals and nursing note reviewed.  Constitutional:      General: He is not in acute distress.    Appearance: He is well-developed.  HENT:     Mouth/Throat:     Pharynx: Posterior oropharyngeal erythema present. No oropharyngeal exudate.  Cardiovascular:     Rate and Rhythm: Normal rate and regular rhythm.  Pulmonary:     Effort: Pulmonary effort is normal.     Breath sounds: Normal breath sounds.  Skin:    General: Skin is warm and dry.  Neurological:     Mental Status: He is alert and oriented to person, place, and time.      Lab Results:  CBC    Component Value Date/Time   WBC 6.4 02/15/2022 0931   RBC 4.34 02/15/2022 0931   HGB 13.0 (L) 02/15/2022 0931   HGB 12.7 (L) 10/10/2018 0958   HCT 39.1 02/15/2022 0931   HCT 35.7 (L) 10/10/2018 0958   PLT 397 02/15/2022 0931   PLT 457 (H) 10/10/2018 0958   MCV 90.1 02/15/2022 0931   MCV 87 10/10/2018 0958   MCH 30.0 02/15/2022 0931   MCHC 33.2 02/15/2022 0931   RDW 13.7 02/15/2022 0931   RDW 12.7 10/10/2018 0958   LYMPHSABS 2,816 02/15/2022 0931   LYMPHSABS 2.9 10/10/2018 0958   MONOABS 553 07/02/2017 1442   EOSABS 282 02/15/2022 0931   EOSABS 0.2 10/10/2018 0958   BASOSABS 32 02/15/2022 0931   BASOSABS 0.0 10/10/2018 0958    BMET    Component Value Date/Time   NA 143 02/22/2022 1205   K 4.5 02/22/2022 1205   CL 101 02/22/2022 1205   CO2 25 02/15/2022 0931   GLUCOSE 102 (H) 02/22/2022 1205   GLUCOSE 91 02/15/2022 0931   BUN 15 02/22/2022 1205   CREATININE 1.03  02/22/2022 1205   CREATININE 1.07 02/15/2022 0931   CALCIUM 9.5 02/22/2022 1205   GFRNONAA 85 11/08/2020 1052   GFRAA 98 11/08/2020 1052     Assessment & Plan:   Pharyngitis - predniSONE (DELTASONE) 20 MG tablet; Take 1 tablet (20 mg total) by mouth daily with breakfast for 5 days.  Dispense: 5 tablet; Refill: 0 - amoxicillin (AMOXIL) 875 MG tablet; Take 1 tablet (875 mg total) by mouth 2 (two) times daily for 10 days.  Dispense: 20 tablet; Refill: 0 - cetirizine (ZYRTEC) 10 MG tablet; Take 1 tablet (10 mg total) by mouth daily.  Dispense: 30 tablet; Refill: 11 -  POCT glycosylated hemoglobin (Hb A1C) - POCT URINALYSIS DIP (CLINITEK)  2. Upper respiratory tract infection, unspecified type  - predniSONE (DELTASONE) 20 MG tablet; Take 1 tablet (20 mg total) by mouth daily with breakfast for 5 days.  Dispense: 5 tablet; Refill: 0 - amoxicillin (AMOXIL) 875 MG tablet; Take 1 tablet (875 mg total) by mouth 2 (two) times daily for 10 days.  Dispense: 20 tablet; Refill: 0 - cetirizine (ZYRTEC) 10 MG tablet; Take 1 tablet (10 mg total) by mouth daily.  Dispense: 30 tablet; Refill: 11 - POCT glycosylated hemoglobin (Hb A1C) - POCT URINALYSIS DIP (CLINITEK)  3. Screening examination for STD (sexually transmitted disease)  - RPR+HIV+GC+CT Panel  Follow up:  Follow up in 3 months or sooner if needed     Fenton Foy, NP 07/20/2022

## 2022-07-20 NOTE — Assessment & Plan Note (Signed)
-   predniSONE (DELTASONE) 20 MG tablet; Take 1 tablet (20 mg total) by mouth daily with breakfast for 5 days.  Dispense: 5 tablet; Refill: 0 - amoxicillin (AMOXIL) 875 MG tablet; Take 1 tablet (875 mg total) by mouth 2 (two) times daily for 10 days.  Dispense: 20 tablet; Refill: 0 - cetirizine (ZYRTEC) 10 MG tablet; Take 1 tablet (10 mg total) by mouth daily.  Dispense: 30 tablet; Refill: 11 - POCT glycosylated hemoglobin (Hb A1C) - POCT URINALYSIS DIP (CLINITEK)  2. Upper respiratory tract infection, unspecified type  - predniSONE (DELTASONE) 20 MG tablet; Take 1 tablet (20 mg total) by mouth daily with breakfast for 5 days.  Dispense: 5 tablet; Refill: 0 - amoxicillin (AMOXIL) 875 MG tablet; Take 1 tablet (875 mg total) by mouth 2 (two) times daily for 10 days.  Dispense: 20 tablet; Refill: 0 - cetirizine (ZYRTEC) 10 MG tablet; Take 1 tablet (10 mg total) by mouth daily.  Dispense: 30 tablet; Refill: 11 - POCT glycosylated hemoglobin (Hb A1C) - POCT URINALYSIS DIP (CLINITEK)  3. Screening examination for STD (sexually transmitted disease)  - RPR+HIV+GC+CT Panel  Follow up:  Follow up in 3 months or sooner if needed

## 2022-07-23 LAB — RPR+HIV+GC+CT PANEL
Chlamydia trachomatis, NAA: NEGATIVE
HIV Screen 4th Generation wRfx: REACTIVE
Neisseria Gonorrhoeae by PCR: NEGATIVE
RPR Ser Ql: NONREACTIVE

## 2022-07-23 LAB — HIV 1/2 AB DIFFERENTIATION
HIV 1 Ab: REACTIVE
HIV 2 Ab: NONREACTIVE
NOTE (HIV CONF MULTIP: POSITIVE — AB

## 2022-08-02 ENCOUNTER — Ambulatory Visit: Payer: Self-pay | Admitting: Nurse Practitioner

## 2022-08-17 ENCOUNTER — Telehealth: Payer: Self-pay | Admitting: Nurse Practitioner

## 2022-08-17 NOTE — Telephone Encounter (Signed)
Refil requests  Allergy med  Hydroxipine  Omeprazole   Requesting a call so pt can clarifiy which locations to send these prescriptions

## 2022-08-20 NOTE — Telephone Encounter (Signed)
Please call to verify pharmacy and send as refill request

## 2022-08-24 ENCOUNTER — Telehealth: Payer: Self-pay

## 2022-08-24 NOTE — Telephone Encounter (Signed)
No additional notes needed  

## 2022-08-30 ENCOUNTER — Other Ambulatory Visit: Payer: Self-pay | Admitting: Nurse Practitioner

## 2022-08-30 ENCOUNTER — Other Ambulatory Visit: Payer: Self-pay

## 2022-08-30 DIAGNOSIS — J029 Acute pharyngitis, unspecified: Secondary | ICD-10-CM

## 2022-08-30 DIAGNOSIS — J069 Acute upper respiratory infection, unspecified: Secondary | ICD-10-CM

## 2022-08-30 DIAGNOSIS — K219 Gastro-esophageal reflux disease without esophagitis: Secondary | ICD-10-CM

## 2022-08-30 DIAGNOSIS — L299 Pruritus, unspecified: Secondary | ICD-10-CM

## 2022-08-30 MED ORDER — HYDROXYZINE PAMOATE 50 MG PO CAPS
50.0000 mg | ORAL_CAPSULE | Freq: Three times a day (TID) | ORAL | 0 refills | Status: DC | PRN
  Filled 2022-08-30 – 2023-05-02 (×2): qty 90, 30d supply, fill #0

## 2022-08-30 MED ORDER — CETIRIZINE HCL 10 MG PO TABS
10.0000 mg | ORAL_TABLET | Freq: Every day | ORAL | 2 refills | Status: DC
  Filled 2022-08-30: qty 30, 30d supply, fill #0

## 2022-08-30 MED ORDER — OMEPRAZOLE 40 MG PO CPDR
40.0000 mg | DELAYED_RELEASE_CAPSULE | Freq: Every day | ORAL | 0 refills | Status: DC
  Filled 2022-08-30 (×2): qty 90, 90d supply, fill #0

## 2022-09-01 ENCOUNTER — Other Ambulatory Visit: Payer: Self-pay | Admitting: Internal Medicine

## 2022-09-01 DIAGNOSIS — B20 Human immunodeficiency virus [HIV] disease: Secondary | ICD-10-CM

## 2022-09-02 ENCOUNTER — Other Ambulatory Visit: Payer: Self-pay | Admitting: Internal Medicine

## 2022-09-02 DIAGNOSIS — B20 Human immunodeficiency virus [HIV] disease: Secondary | ICD-10-CM

## 2022-09-05 ENCOUNTER — Other Ambulatory Visit: Payer: Self-pay | Admitting: Internal Medicine

## 2022-09-05 DIAGNOSIS — B20 Human immunodeficiency virus [HIV] disease: Secondary | ICD-10-CM

## 2022-09-06 ENCOUNTER — Other Ambulatory Visit: Payer: Self-pay

## 2022-11-28 ENCOUNTER — Ambulatory Visit: Payer: Self-pay | Admitting: Nurse Practitioner

## 2022-12-12 ENCOUNTER — Ambulatory Visit: Payer: Self-pay | Admitting: Nurse Practitioner

## 2022-12-14 ENCOUNTER — Ambulatory Visit: Payer: Self-pay | Admitting: Nurse Practitioner

## 2022-12-14 NOTE — Progress Notes (Deleted)
Complete physical exam  Patient: Todd Carroll   DOB: 1976-06-19   47 y.o. Male  MRN: [id number]  Subjective:    No chief complaint on file.   Todd Carroll is a 47 y.o. male who presents today for a complete physical exam. He reports consuming a {diet types:17450} diet. {types:19826} He generally feels {DESC; WELL/FAIRLY WELL/POORLY:18703}. He reports sleeping {DESC; WELL/FAIRLY WELL/POORLY:18703}. He {does/does not:200015} have additional problems to discuss today.    Most recent fall risk assessment:    05/09/2022   10:35 AM  Fall Risk   Falls in the past year? 0  Number falls in past yr: 0  Injury with Fall? 0  Risk for fall due to : No Fall Risks  Follow up Falls evaluation completed     Most recent depression screenings:    05/09/2022   10:35 AM 03/05/2022    8:38 AM  PHQ 2/9 Scores  PHQ - 2 Score 0 0    {VISON DENTAL STD PSA (Optional):27386}  {History (Optional):23778}  Patient Care Team: Fenton Foy, NP as PCP - General (Pulmonary Disease) Carlyle Basques, MD as PCP - Infectious Diseases (Infectious Diseases) Carlyle Basques, MD (Infectious Diseases)   Outpatient Medications Prior to Visit  Medication Sig   albuterol (VENTOLIN HFA) 108 (90 Base) MCG/ACT inhaler Inhale 1-2 puffs into the lungs every 6 (six) hours as needed for wheezing or shortness of breath.   amoxicillin-clavulanate (AUGMENTIN) 875-125 MG tablet Take 1 tablet by mouth 2 (two) times daily. (Patient not taking: Reported on 07/20/2022)   bictegravir-emtricitabine-tenofovir AF (BIKTARVY) 50-200-25 MG TABS tablet Take 1 tablet by mouth daily.   Blood Glucose Monitoring Suppl (TRUE METRIX METER) w/Device KIT Use as directed up to 4 times daily.   cetirizine (ZYRTEC) 10 MG tablet Take 1 tablet (10 mg total) by mouth daily.   meloxicam (MOBIC) 7.5 MG tablet Take 1 tablet (7.5 mg total) by mouth daily as needed for pain.   moxifloxacin (VIGAMOX) 0.5 % ophthalmic solution Place 1 drop  into both eyes 3 (three) times daily. (Patient not taking: Reported on 07/20/2022)   multivitamin (ONE-A-DAY MEN'S) TABS tablet Take 1 tablet by mouth daily.   omeprazole (PRILOSEC) 40 MG capsule Take 1 capsule (40 mg total) by mouth daily.   sodium fluoride (PREVIDENT 5000 PLUS) 1.1 % CREA dental cream Use a pea sized amount of this toothpaste in the evening   tiZANidine (ZANAFLEX) 4 MG tablet Take 1 tablet by mouth twice daily as needed   valACYclovir (VALTREX) 500 MG tablet Take 1 tablet (500 mg total) by mouth daily.   No facility-administered medications prior to visit.    ROS        Objective:     There were no vitals taken for this visit. {Vitals History (Optional):23777}  Physical Exam   No results found for any visits on 12/14/22. {Show previous labs (optional):23779}    Assessment & Plan:    Routine Health Maintenance and Physical Exam  Immunization History  Administered Date(s) Administered   HPV 9-valent 05/15/2018, 06/13/2018   Hepatitis A 04/02/2013   Hepatitis A, Adult 10/23/2013   Hepatitis B 04/02/2013, 06/02/2013   Hepatitis B, adult 01/05/2014   Influenza Split 09/14/2011   Influenza,inj,Quad PF,6+ Mos 10/23/2013, 11/01/2014, 11/15/2015, 08/31/2016, 08/31/2016, 07/24/2017, 09/11/2018, 09/16/2020, 11/02/2021   Meningococcal Mcv4o 03/06/2017, 09/12/2018   PFIZER(Purple Top)SARS-COV-2 Vaccination 03/23/2020, 04/12/2020, 11/08/2020   PPD Test 03/19/2013   Pneumococcal Conjugate-13 05/15/2018   Pneumococcal Polysaccharide-23 03/19/2013, 12/31/2018   Td  12/03/2006   Tdap 07/24/2017   Vaccinia,smallpox Monkeypox Vaccine Live,pf 08/03/2021    Health Maintenance  Topic Date Due   HPV VACCINES (3 - Risk 3-dose SCDM series) 11/14/2018   Fecal DNA (Cologuard)  Never done   INFLUENZA VACCINE  07/03/2022   COVID-19 Vaccine (4 - 2023-24 season) 08/03/2022   DTaP/Tdap/Td (3 - Td or Tdap) 07/25/2027   Hepatitis C Screening  Completed   HIV Screening   Completed    Discussed health benefits of physical activity, and encouraged him to engage in regular exercise appropriate for his age and condition.  Problem List Items Addressed This Visit   None  No follow-ups on file.     Elyse Jarvis, RMA

## 2022-12-19 ENCOUNTER — Other Ambulatory Visit: Payer: Self-pay

## 2022-12-24 ENCOUNTER — Ambulatory Visit: Payer: Self-pay | Admitting: Nurse Practitioner

## 2023-01-22 ENCOUNTER — Other Ambulatory Visit: Payer: Self-pay | Admitting: Internal Medicine

## 2023-01-22 DIAGNOSIS — A6001 Herpesviral infection of penis: Secondary | ICD-10-CM

## 2023-01-22 DIAGNOSIS — B20 Human immunodeficiency virus [HIV] disease: Secondary | ICD-10-CM

## 2023-02-04 ENCOUNTER — Other Ambulatory Visit: Payer: Self-pay | Admitting: Internal Medicine

## 2023-02-04 ENCOUNTER — Other Ambulatory Visit: Payer: Self-pay

## 2023-02-04 ENCOUNTER — Ambulatory Visit (INDEPENDENT_AMBULATORY_CARE_PROVIDER_SITE_OTHER): Payer: Self-pay | Admitting: Internal Medicine

## 2023-02-04 ENCOUNTER — Encounter: Payer: Self-pay | Admitting: Internal Medicine

## 2023-02-04 VITALS — BP 127/88 | HR 94 | Temp 98.6°F | Resp 16 | Wt 269.0 lb

## 2023-02-04 DIAGNOSIS — A6001 Herpesviral infection of penis: Secondary | ICD-10-CM

## 2023-02-04 DIAGNOSIS — R350 Frequency of micturition: Secondary | ICD-10-CM

## 2023-02-04 DIAGNOSIS — B20 Human immunodeficiency virus [HIV] disease: Secondary | ICD-10-CM

## 2023-02-04 DIAGNOSIS — Z79899 Other long term (current) drug therapy: Secondary | ICD-10-CM

## 2023-02-04 DIAGNOSIS — K219 Gastro-esophageal reflux disease without esophagitis: Secondary | ICD-10-CM

## 2023-02-04 MED ORDER — ROSUVASTATIN CALCIUM 10 MG PO TABS: 10.0000 mg | ORAL_TABLET | Freq: Every day | ORAL | 11 refills | Status: DC

## 2023-02-04 MED ORDER — BIKTARVY 50-200-25 MG PO TABS: 1.0000 | ORAL_TABLET | Freq: Every day | ORAL | 11 refills | Status: DC

## 2023-02-04 MED ORDER — PROMETHAZINE HCL 25 MG PO TABS: 25.0000 mg | ORAL_TABLET | Freq: Four times a day (QID) | ORAL | 0 refills | Status: DC | PRN

## 2023-02-04 MED ORDER — OMEPRAZOLE 40 MG PO CPDR: 40.0000 mg | DELAYED_RELEASE_CAPSULE | Freq: Every day | ORAL | 3 refills | Status: DC

## 2023-02-04 NOTE — Progress Notes (Signed)
RFV: follow up for hiv disease-annual visit  Patient ID: PANAV Todd Carroll, male   DOB: July 23, 1976, 47 y.o.   MRN: [id number]  HPI Todd Carroll is a 47yo F with hiv disease, well controlled. On bitkarvy.   Nausea from a month; intermittent frequent urination  Outpatient Encounter Medications as of 02/04/2023  Medication Sig   albuterol (VENTOLIN HFA) 108 (90 Base) MCG/ACT inhaler Inhale 1-2 puffs into the lungs every 6 (six) hours as needed for wheezing or shortness of breath.   amoxicillin-clavulanate (AUGMENTIN) 875-125 MG tablet Take 1 tablet by mouth 2 (two) times daily. (Patient not taking: Reported on 07/20/2022)   bictegravir-emtricitabine-tenofovir AF (BIKTARVY) 50-200-25 MG TABS tablet Take 1 tablet by mouth daily.   Blood Glucose Monitoring Suppl (TRUE METRIX METER) w/Device KIT Use as directed up to 4 times daily.   cetirizine (ZYRTEC) 10 MG tablet Take 1 tablet (10 mg total) by mouth daily.   meloxicam (MOBIC) 7.5 MG tablet Take 1 tablet (7.5 mg total) by mouth daily as needed for pain.   moxifloxacin (VIGAMOX) 0.5 % ophthalmic solution Place 1 drop into both eyes 3 (three) times daily. (Patient not taking: Reported on 07/20/2022)   multivitamin (ONE-A-DAY MEN'S) TABS tablet Take 1 tablet by mouth daily.   omeprazole (PRILOSEC) 40 MG capsule Take 1 capsule (40 mg total) by mouth daily.   sodium fluoride (PREVIDENT 5000 PLUS) 1.1 % CREA dental cream Use a pea sized amount of this toothpaste in the evening   tiZANidine (ZANAFLEX) 4 MG tablet Take 1 tablet by mouth twice daily as needed   valACYclovir (VALTREX) 500 MG tablet Take 1 tablet (500 mg total) by mouth daily.   No facility-administered encounter medications on file as of 02/04/2023.     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   Diarrhea 08/10/2021   Streptococcus A carrier or suspected carrier 07/24/2019   Underinsured 07/24/2019   Dysphagia  10/23/2018   Constipation 10/23/2018   Hematospermia 11/01/2014   Scalp pruritus 07/25/2014   Itch 05/25/2014   Rash and nonspecific skin eruption 01/05/2014   Folliculitis 07/13/2013   Pain, dental 07/13/2013   Dysuria 04/29/2013   Human immunodeficiency virus (HIV) disease (HCC) 02/27/2013   GERD 05/04/2008   ALLERGIC RHINITIS 08/27/2007     Health Maintenance Due  Topic Date Due   HPV VACCINES (3 - Risk 3-dose SCDM series) 11/14/2018   Fecal DNA (Cologuard)  Never done   INFLUENZA VACCINE  07/03/2022   COVID-19 Vaccine (4 - 2023-24 season) 08/03/2022     Review of Systems 12 point ros is  negative except what is mentioned above Physical Exam  BP 127/88   Pulse 94   Temp 98.6 F (37 C) (Oral)   Resp 16   Wt 269 lb (122 kg)   SpO2 96%   BMI 35.49 kg/m  Physical Exam  Constitutional: He is oriented to person, place, and time. He appears well-developed and well-nourished. No distress.  HENT:  Mouth/Throat: Oropharynx is clear and moist. No oropharyngeal exudate.  Cardiovascular: Normal rate, regular rhythm and normal heart sounds. Exam reveals no gallop and no friction rub.  No murmur heard.  Pulmonary/Chest: Effort normal and breath sounds normal. No respiratory distress. He has no wheezes.  Abdominal: Soft. Bowel sounds are normal. He exhibits no distension. There is no tenderness.  Lymphadenopathy:  He has no cervical adenopathy.  Neurological: He is alert and oriented to person, place,  and time.  Skin: Skin is warm and dry. No rash noted. No erythema.  Psychiatric: He has a normal mood and affect. His behavior is normal.   Lab Results  Component Value Date   CD4TCELL 36 02/15/2022   Lab Results  Component Value Date   CD4TABS 1,210 08/03/2021   CD4TABS 817 11/08/2020   CD4TABS 914 12/28/2019   Lab Results  Component Value Date   HIV1RNAQUANT Not Detected 02/15/2022   Lab Results  Component Value Date   HEPBSAB NONREACTIVE 03/19/2013   Lab Results   Component Value Date   LABRPR Non Reactive 07/20/2022    CBC Lab Results  Component Value Date   WBC 6.4 02/15/2022   RBC 4.34 02/15/2022   HGB 13.0 (L) 02/15/2022   HCT 39.1 02/15/2022   PLT 397 02/15/2022   MCV 90.1 02/15/2022   MCH 30.0 02/15/2022   MCHC 33.2 02/15/2022   RDW 13.7 02/15/2022   LYMPHSABS 2,816 02/15/2022   MONOABS 553 07/02/2017   EOSABS 282 02/15/2022    BMET Lab Results  Component Value Date   NA 143 02/22/2022   K 4.5 02/22/2022   CL 101 02/22/2022   CO2 25 02/15/2022   GLUCOSE 102 (H) 02/22/2022   BUN 15 02/22/2022   CREATININE 1.03 02/22/2022   CALCIUM 9.5 02/22/2022   GFRNONAA 85 11/08/2020   GFRAA 98 11/08/2020      Assessment and Plan  HIV disease= will check labs as well as provide refills on biktarvy  Long term medication management = will check cr  Nausea = unclear if related to taking medication. Will give rx of zofran ;   Intermittent urination/urgency = will check for diabetes  Health maintenance =will check for STI as well as lipid profile

## 2023-02-05 LAB — CYTOLOGY, (ORAL, ANAL, URETHRAL) ANCILLARY ONLY
Chlamydia: POSITIVE — AB
Chlamydia: POSITIVE — AB
Comment: NEGATIVE
Comment: NEGATIVE
Comment: NORMAL
Comment: NORMAL
Neisseria Gonorrhea: NEGATIVE
Neisseria Gonorrhea: NEGATIVE

## 2023-02-05 LAB — T-HELPER CELL (CD4) - (RCID CLINIC ONLY)
CD4 % Helper T Cell: 33 % (ref 33–65)
CD4 T Cell Abs: 713 /uL (ref 400–1790)

## 2023-02-06 ENCOUNTER — Telehealth: Payer: Self-pay

## 2023-02-06 DIAGNOSIS — A749 Chlamydial infection, unspecified: Secondary | ICD-10-CM

## 2023-02-06 LAB — CYTOLOGY - PAP: Diagnosis: NEGATIVE

## 2023-02-06 NOTE — Telephone Encounter (Signed)
STI oral and rectal swabs - positive for chlamydia. Per Dr.Snider - patient will need azithromycin 1 gm.  Left voicemail asking patient to return my call.  [place], CMA

## 2023-02-06 NOTE — Telephone Encounter (Signed)
Patient aware. Scheduled for nurse visit tomorrow 02/07/2023.   [place], CMA

## 2023-02-07 ENCOUNTER — Ambulatory Visit: Payer: Self-pay

## 2023-02-07 LAB — CBC WITH DIFFERENTIAL/PLATELET
Absolute Monocytes: 476 cells/uL (ref 200–950)
Basophils Absolute: 41 cells/uL (ref 0–200)
Basophils Relative: 0.6 %
Eosinophils Absolute: 252 cells/uL (ref 15–500)
Eosinophils Relative: 3.7 %
HCT: 40.2 % (ref 38.5–50.0)
Hemoglobin: 13.5 g/dL (ref 13.2–17.1)
Lymphs Abs: 2373 cells/uL (ref 850–3900)
MCH: 29.5 pg (ref 27.0–33.0)
MCHC: 33.6 g/dL (ref 32.0–36.0)
MCV: 88 fL (ref 80.0–100.0)
MPV: 9.4 fL (ref 7.5–12.5)
Monocytes Relative: 7 %
Neutro Abs: 3658 cells/uL (ref 1500–7800)
Neutrophils Relative %: 53.8 %
Platelets: 433 10*3/uL — ABNORMAL HIGH (ref 140–400)
RBC: 4.57 10*6/uL (ref 4.20–5.80)
RDW: 13.6 % (ref 11.0–15.0)
Total Lymphocyte: 34.9 %
WBC: 6.8 10*3/uL (ref 3.8–10.8)

## 2023-02-07 LAB — COMPLETE METABOLIC PANEL WITH GFR
AG Ratio: 1.2 (calc) (ref 1.0–2.5)
ALT: 28 U/L (ref 9–46)
AST: 20 U/L (ref 10–40)
Albumin: 4.4 g/dL (ref 3.6–5.1)
Alkaline phosphatase (APISO): 88 U/L (ref 36–130)
BUN: 12 mg/dL (ref 7–25)
CO2: 29 mmol/L (ref 20–32)
Calcium: 9.7 mg/dL (ref 8.6–10.3)
Chloride: 101 mmol/L (ref 98–110)
Creat: 1.02 mg/dL (ref 0.60–1.29)
Globulin: 3.6 g/dL (calc) (ref 1.9–3.7)
Glucose, Bld: 113 mg/dL — ABNORMAL HIGH (ref 65–99)
Potassium: 4.2 mmol/L (ref 3.5–5.3)
Sodium: 138 mmol/L (ref 135–146)
Total Bilirubin: 0.4 mg/dL (ref 0.2–1.2)
Total Protein: 8 g/dL (ref 6.1–8.1)
eGFR: 92 mL/min/{1.73_m2} (ref >=60)

## 2023-02-07 LAB — LIPID PANEL
Cholesterol: 223 mg/dL — ABNORMAL HIGH (ref <200)
HDL: 57 mg/dL (ref >=40)
LDL Cholesterol (Calc): 131 mg/dL (calc) — ABNORMAL HIGH
Non-HDL Cholesterol (Calc): 166 mg/dL (calc) — ABNORMAL HIGH (ref <130)
Total CHOL/HDL Ratio: 3.9 (calc) (ref <5.0)
Triglycerides: 209 mg/dL — ABNORMAL HIGH (ref <150)

## 2023-02-07 LAB — RPR: RPR Ser Ql: NONREACTIVE

## 2023-02-07 LAB — HIV-1 RNA QUANT-NO REFLEX-BLD
HIV 1 RNA Quant: NOT DETECTED Copies/mL
HIV-1 RNA Quant, Log: NOT DETECTED Log cps/mL

## 2023-02-07 LAB — HEMOGLOBIN A1C
Hgb A1c MFr Bld: 6.5 % of total Hgb — ABNORMAL HIGH (ref <5.7)
Mean Plasma Glucose: 140 mg/dL
eAG (mmol/L): 7.7 mmol/L

## 2023-02-07 LAB — HEPATITIS B SURFACE ANTIGEN: Hepatitis B Surface Ag: NONREACTIVE

## 2023-02-08 ENCOUNTER — Ambulatory Visit: Payer: Self-pay

## 2023-02-08 MED ORDER — DOXYCYCLINE HYCLATE 100 MG PO TABS: 100.0000 mg | ORAL_TABLET | Freq: Two times a day (BID) | ORAL | 0 refills | Status: DC

## 2023-02-08 NOTE — Telephone Encounter (Signed)
Patient missed appointment yesterday - left voicemail asking to return my call to get him rescheduled.    [place], CMA

## 2023-02-08 NOTE — Telephone Encounter (Signed)
Per Dr. Baxter Flattery, patient needs doxycycline 100 mg BID PO for 28 days. Prescription sent. Staff called patient to notify him, no answer. Staff left message stating new medication was sent to the pharmacy.  Beryle Flock, RN

## 2023-02-08 NOTE — Addendum Note (Signed)
Addended by: Lucie Leather D on: 02/08/2023 12:24 PM   Modules accepted: Orders

## 2023-02-08 NOTE — Telephone Encounter (Signed)
Patient returned call, says he can be in before 12:15 today.  Beryle Flock, RN

## 2023-02-08 NOTE — Telephone Encounter (Signed)
Patient returned call, advised not to come to appointment until staff can confirm treatment with Dr. Baxter Flattery.   Beryle Flock, RN

## 2023-02-08 NOTE — Telephone Encounter (Signed)
Patient has allergy to azithromycin listed, will need alternative treatment.   Called patient x 2 to cancel nurse visit, no answer.   Message sent to Dr. Baxter Flattery for alternative.   Beryle Flock, RN

## 2023-02-13 ENCOUNTER — Other Ambulatory Visit: Payer: Self-pay | Admitting: Internal Medicine

## 2023-02-26 ENCOUNTER — Other Ambulatory Visit: Payer: Self-pay | Admitting: Nurse Practitioner

## 2023-02-26 NOTE — Telephone Encounter (Signed)
Please advise  Todd Carroll RMA

## 2023-03-06 ENCOUNTER — Other Ambulatory Visit: Payer: Self-pay | Admitting: Internal Medicine

## 2023-04-05 ENCOUNTER — Other Ambulatory Visit: Payer: Self-pay | Admitting: Internal Medicine

## 2023-04-05 DIAGNOSIS — A749 Chlamydial infection, unspecified: Secondary | ICD-10-CM

## 2023-05-02 ENCOUNTER — Ambulatory Visit (INDEPENDENT_AMBULATORY_CARE_PROVIDER_SITE_OTHER): Payer: Self-pay | Admitting: Nurse Practitioner

## 2023-05-02 ENCOUNTER — Other Ambulatory Visit: Payer: Self-pay | Admitting: Nurse Practitioner

## 2023-05-02 ENCOUNTER — Encounter: Payer: Self-pay | Admitting: Nurse Practitioner

## 2023-05-02 ENCOUNTER — Other Ambulatory Visit: Payer: Self-pay

## 2023-05-02 VITALS — BP 116/77 | HR 68 | Temp 97.3°F | Ht 73.0 in | Wt 257.0 lb

## 2023-05-02 DIAGNOSIS — L299 Pruritus, unspecified: Secondary | ICD-10-CM

## 2023-05-02 DIAGNOSIS — Z1211 Encounter for screening for malignant neoplasm of colon: Secondary | ICD-10-CM

## 2023-05-02 DIAGNOSIS — K219 Gastro-esophageal reflux disease without esophagitis: Secondary | ICD-10-CM

## 2023-05-02 DIAGNOSIS — Z131 Encounter for screening for diabetes mellitus: Secondary | ICD-10-CM

## 2023-05-02 DIAGNOSIS — R059 Cough, unspecified: Secondary | ICD-10-CM

## 2023-05-02 DIAGNOSIS — A6001 Herpesviral infection of penis: Secondary | ICD-10-CM

## 2023-05-02 HISTORY — DX: Herpesviral infection of penis: A60.01

## 2023-05-02 LAB — POCT GLYCOSYLATED HEMOGLOBIN (HGB A1C): Hemoglobin A1C: 6 % — AB (ref 4.0–5.6)

## 2023-05-02 LAB — POCT URINALYSIS DIPSTICK
Bilirubin, UA: NEGATIVE
Blood, UA: NEGATIVE
Glucose, UA: NEGATIVE
Ketones, UA: NEGATIVE
Leukocytes, UA: NEGATIVE
Nitrite, UA: NEGATIVE
Protein, UA: NEGATIVE
Spec Grav, UA: 1.025 (ref 1.010–1.025)
Urobilinogen, UA: 0.2 E.U./dL
pH, UA: 6 (ref 5.0–8.0)

## 2023-05-02 MED ORDER — HYDROXYZINE PAMOATE 50 MG PO CAPS
50.0000 mg | ORAL_CAPSULE | Freq: Three times a day (TID) | ORAL | 0 refills | Status: DC | PRN
Start: 2023-05-02 — End: 2024-02-05
  Filled 2023-05-20: qty 90, 30d supply, fill #0

## 2023-05-02 MED ORDER — OMEPRAZOLE 40 MG PO CPDR
40.0000 mg | DELAYED_RELEASE_CAPSULE | Freq: Every day | ORAL | 3 refills | Status: DC
Start: 2023-05-02 — End: 2023-07-22
  Filled 2023-05-02 – 2023-05-20 (×2): qty 90, 90d supply, fill #0

## 2023-05-02 MED ORDER — VALACYCLOVIR HCL 500 MG PO TABS
500.0000 mg | ORAL_TABLET | Freq: Every day | ORAL | 11 refills | Status: DC
Start: 2023-05-02 — End: 2024-02-05
  Filled 2023-05-02: qty 30, 30d supply, fill #0

## 2023-05-02 MED ORDER — ROSUVASTATIN CALCIUM 10 MG PO TABS
10.0000 mg | ORAL_TABLET | Freq: Every day | ORAL | 11 refills | Status: DC
  Filled 2023-05-02: qty 30, 30d supply, fill #0

## 2023-05-02 MED ORDER — ALBUTEROL SULFATE HFA 108 (90 BASE) MCG/ACT IN AERS
1.0000 | INHALATION_SPRAY | Freq: Four times a day (QID) | RESPIRATORY_TRACT | 2 refills | Status: DC | PRN
Start: 2023-05-02 — End: 2024-02-05
  Filled 2023-05-02: qty 6.7, 25d supply, fill #0
  Filled 2023-05-20: qty 6.7, 10d supply, fill #0

## 2023-05-02 MED ORDER — TIZANIDINE HCL 4 MG PO TABS
4.0000 mg | ORAL_TABLET | Freq: Two times a day (BID) | ORAL | 0 refills | Status: DC | PRN
  Filled 2023-05-02: qty 60, 30d supply, fill #0

## 2023-05-02 MED ORDER — FEXOFENADINE HCL 180 MG PO TABS
180.0000 mg | ORAL_TABLET | Freq: Every day | ORAL | 2 refills | Status: DC
  Filled 2023-05-02: qty 15, 15d supply, fill #0
  Filled 2023-05-02: qty 30, 30d supply, fill #0

## 2023-05-02 NOTE — Patient Instructions (Signed)
1. Herpes simplex infection of penis  - valACYclovir (VALTREX) 500 MG tablet; TAKE 1 TABLET(500 MG) BY MOUTH DAILY  Dispense: 30 tablet; Refill: 11  2. Gastroesophageal reflux disease without esophagitis  - omeprazole (PRILOSEC) 40 MG capsule; Take 1 capsule (40 mg total) by mouth daily.  Dispense: 90 capsule; Refill: 3  3. Pruritus  - hydrOXYzine (VISTARIL) 50 MG capsule; Take 1 capsule (50 mg total) by mouth 3 (three) times daily as needed.  Dispense: 90 capsule; Refill: 0  4. Cough, unspecified type  - albuterol (VENTOLIN HFA) 108 (90 Base) MCG/ACT inhaler; Inhale 1-2 puffs into the lungs every 6 (six) hours as needed for wheezing or shortness of breath.  Dispense: 6.7 g; Refill: 2  5. Diabetes mellitus screening  - POCT glycosylated hemoglobin (Hb A1C)  6. Colon cancer screening  - Ambulatory referral to Gastroenterology  Follow up:  Follow up in 6 months

## 2023-05-02 NOTE — Assessment & Plan Note (Signed)
-   valACYclovir (VALTREX) 500 MG tablet; TAKE 1 TABLET(500 MG) BY MOUTH DAILY  Dispense: 30 tablet; Refill: 11  2. Gastroesophageal reflux disease without esophagitis  - omeprazole (PRILOSEC) 40 MG capsule; Take 1 capsule (40 mg total) by mouth daily.  Dispense: 90 capsule; Refill: 3  3. Pruritus  - hydrOXYzine (VISTARIL) 50 MG capsule; Take 1 capsule (50 mg total) by mouth 3 (three) times daily as needed.  Dispense: 90 capsule; Refill: 0  4. Cough, unspecified type  - albuterol (VENTOLIN HFA) 108 (90 Base) MCG/ACT inhaler; Inhale 1-2 puffs into the lungs every 6 (six) hours as needed for wheezing or shortness of breath.  Dispense: 6.7 g; Refill: 2  5. Diabetes mellitus screening  - POCT glycosylated hemoglobin (Hb A1C)  6. Colon cancer screening  - Ambulatory referral to Gastroenterology  Follow up:  Follow up in 6 months

## 2023-05-02 NOTE — Progress Notes (Signed)
@Patient  ID: Todd Carroll, male    DOB: 1976-10-27, 47 y.o.   MRN: [id number]  Chief Complaint  Patient presents with   Follow-up    Referring provider: Ivonne Andrew, NP   HPI  Todd Carroll presents for follow up. He  has a past medical history of ALLERGIC RHINITIS (08/27/2007), Allergy, and GERD (05/04/2008).    He is in today for a follow up. He endorses dietary changes to improve weight and has had an 16 pound weight loss since last August. Patient does need refills on medications. He is requesting UA today. Denies fever, chills, headache, dizziness, visual changes, polydipsia,  polyphagia shortness of breath, dyspnea on exertion, chest pain, nausea, vomiting, polyuria, constipation, diarrhea, or any edema.   Note: UA in office today was negative    Allergies  Allergen Reactions   Azithromycin Itching    Immunization History  Administered Date(s) Administered   HPV 9-valent 05/15/2018, 06/13/2018   Hepatitis A 04/02/2013   Hepatitis A, Adult 10/23/2013   Hepatitis B 04/02/2013, 06/02/2013   Hepatitis B, ADULT 01/05/2014   Influenza Split 09/14/2011   Influenza,inj,Quad PF,6+ Mos 10/23/2013, 11/01/2014, 11/15/2015, 08/31/2016, 08/31/2016, 07/24/2017, 09/11/2018, 09/16/2020, 11/02/2021   Meningococcal Mcv4o 03/06/2017, 09/12/2018   PFIZER(Purple Top)SARS-COV-2 Vaccination 03/23/2020, 04/12/2020, 11/08/2020   PPD Test 03/19/2013   Pneumococcal Conjugate-13 05/15/2018   Pneumococcal Polysaccharide-23 03/19/2013, 12/31/2018   Td 12/03/2006   Tdap 07/24/2017   Vaccinia,smallpox Monkeypox Vaccine Live,pf 08/03/2021    Past Medical History:  Diagnosis Date   ALLERGIC RHINITIS 08/27/2007   Allergy    GERD 05/04/2008    Tobacco History: Social History   Tobacco Use  Smoking Status Never  Smokeless Tobacco Never   Counseling given: Not Answered   Outpatient Encounter Medications as of 05/02/2023  Medication Sig   bictegravir-emtricitabine-tenofovir  AF (BIKTARVY) 50-200-25 MG TABS tablet Take 1 tablet by mouth daily.   Blood Glucose Monitoring Suppl (TRUE METRIX METER) w/Device KIT Use as directed up to 4 times daily.   fexofenadine (ALLEGRA ALLERGY) 180 MG tablet Take 1 tablet (180 mg total) by mouth daily.   hydrOXYzine (VISTARIL) 50 MG capsule TAKE 1 CAPSULE(50 MG) BY MOUTH THREE TIMES DAILY AS NEEDED   moxifloxacin (VIGAMOX) 0.5 % ophthalmic solution Place 1 drop into both eyes 3 (three) times daily.   multivitamin (ONE-A-DAY MEN'S) TABS tablet Take 1 tablet by mouth daily.   promethazine (PHENERGAN) 25 MG tablet Take 1 tablet (25 mg total) by mouth every 6 (six) hours as needed for nausea or vomiting.   sodium fluoride (PREVIDENT 5000 PLUS) 1.1 % CREA dental cream Use a pea sized amount of this toothpaste in the evening   [DISCONTINUED] albuterol (VENTOLIN HFA) 108 (90 Base) MCG/ACT inhaler Inhale 1-2 puffs into the lungs every 6 (six) hours as needed for wheezing or shortness of breath.   [DISCONTINUED] cetirizine (ZYRTEC) 10 MG tablet Take 1 tablet (10 mg total) by mouth daily.   [DISCONTINUED] hydrOXYzine (VISTARIL) 50 MG capsule Take 1 capsule (50 mg total) by mouth 3 (three) times daily as needed.   [DISCONTINUED] omeprazole (PRILOSEC) 40 MG capsule Take 1 capsule (40 mg total) by mouth daily.   [DISCONTINUED] rosuvastatin (CRESTOR) 10 MG tablet Take 1 tablet (10 mg total) by mouth daily.   [DISCONTINUED] tiZANidine (ZANAFLEX) 4 MG tablet Take 1 tablet by mouth twice daily as needed   [DISCONTINUED] valACYclovir (VALTREX) 500 MG tablet TAKE 1 TABLET(500 MG) BY MOUTH DAILY   albuterol (VENTOLIN HFA) 108 (90 Base)  MCG/ACT inhaler Inhale 1-2 puffs into the lungs every 6 (six) hours as needed for wheezing or shortness of breath.   amoxicillin-clavulanate (AUGMENTIN) 875-125 MG tablet Take 1 tablet by mouth 2 (two) times daily. (Patient not taking: Reported on 07/20/2022)   doxycycline (VIBRA-TABS) 100 MG tablet Take 1 tablet (100 mg total)  by mouth 2 (two) times daily. (Patient not taking: Reported on 05/02/2023)   hydrOXYzine (VISTARIL) 50 MG capsule Take 1 capsule (50 mg total) by mouth 3 (three) times daily as needed.   meloxicam (MOBIC) 7.5 MG tablet Take 1 tablet (7.5 mg total) by mouth daily as needed for pain. (Patient not taking: Reported on 05/02/2023)   omeprazole (PRILOSEC) 40 MG capsule Take 1 capsule (40 mg total) by mouth daily.   rosuvastatin (CRESTOR) 10 MG tablet Take 1 tablet (10 mg total) by mouth daily.   tiZANidine (ZANAFLEX) 4 MG tablet Take 1 tablet (4 mg total) by mouth 2 (two) times daily as needed.   valACYclovir (VALTREX) 500 MG tablet Take 1 tablet (500 mg total) by mouth daily.   No facility-administered encounter medications on file as of 05/02/2023.     Review of Systems  Review of Systems  Constitutional: Negative.   HENT: Negative.    Cardiovascular: Negative.   Gastrointestinal: Negative.   Allergic/Immunologic: Negative.   Neurological: Negative.   Psychiatric/Behavioral: Negative.         Physical Exam  BP 116/77   Pulse 68   Temp (!) 97.3 F (36.3 C)   Ht 6\' 1"  (1.854 m)   Wt 257 lb (116.6 kg)   SpO2 97%   BMI 33.91 kg/m   Wt Readings from Last 5 Encounters:  05/02/23 257 lb (116.6 kg)  02/04/23 269 lb (122 kg)  07/20/22 273 lb 3.2 oz (123.9 kg)  05/09/22 268 lb (121.6 kg)  03/05/22 267 lb (121.1 kg)     Physical Exam Vitals and nursing note reviewed.  Constitutional:      General: He is not in acute distress.    Appearance: He is well-developed.  Cardiovascular:     Rate and Rhythm: Normal rate and regular rhythm.  Pulmonary:     Effort: Pulmonary effort is normal.     Breath sounds: Normal breath sounds.  Skin:    General: Skin is warm and dry.  Neurological:     Mental Status: He is alert and oriented to person, place, and time.      Lab Results:  CBC    Component Value Date/Time   WBC 6.8 02/04/2023 1513   RBC 4.57 02/04/2023 1513   HGB 13.5  02/04/2023 1513   HGB 12.7 (L) 10/10/2018 0958   HCT 40.2 02/04/2023 1513   HCT 35.7 (L) 10/10/2018 0958   PLT 433 (H) 02/04/2023 1513   PLT 457 (H) 10/10/2018 0958   MCV 88.0 02/04/2023 1513   MCV 87 10/10/2018 0958   MCH 29.5 02/04/2023 1513   MCHC 33.6 02/04/2023 1513   RDW 13.6 02/04/2023 1513   RDW 12.7 10/10/2018 0958   LYMPHSABS 2,373 02/04/2023 1513   LYMPHSABS 2.9 10/10/2018 0958   MONOABS 553 07/02/2017 1442   EOSABS 252 02/04/2023 1513   EOSABS 0.2 10/10/2018 0958   BASOSABS 41 02/04/2023 1513   BASOSABS 0.0 10/10/2018 0958    BMET    Component Value Date/Time   NA 138 02/04/2023 1513   NA 143 02/22/2022 1205   K 4.2 02/04/2023 1513   CL 101 02/04/2023 1513   CO2  29 02/04/2023 1513   GLUCOSE 113 (H) 02/04/2023 1513   BUN 12 02/04/2023 1513   BUN 15 02/22/2022 1205   CREATININE 1.02 02/04/2023 1513   CALCIUM 9.7 02/04/2023 1513   GFRNONAA 85 11/08/2020 1052   GFRAA 98 11/08/2020 1052     Assessment & Plan:   Herpes simplex infection of penis - valACYclovir (VALTREX) 500 MG tablet; TAKE 1 TABLET(500 MG) BY MOUTH DAILY  Dispense: 30 tablet; Refill: 11  2. Gastroesophageal reflux disease without esophagitis  - omeprazole (PRILOSEC) 40 MG capsule; Take 1 capsule (40 mg total) by mouth daily.  Dispense: 90 capsule; Refill: 3  3. Pruritus  - hydrOXYzine (VISTARIL) 50 MG capsule; Take 1 capsule (50 mg total) by mouth 3 (three) times daily as needed.  Dispense: 90 capsule; Refill: 0  4. Cough, unspecified type  - albuterol (VENTOLIN HFA) 108 (90 Base) MCG/ACT inhaler; Inhale 1-2 puffs into the lungs every 6 (six) hours as needed for wheezing or shortness of breath.  Dispense: 6.7 g; Refill: 2  5. Diabetes mellitus screening  - POCT glycosylated hemoglobin (Hb A1C)  6. Colon cancer screening  - Ambulatory referral to Gastroenterology  Follow up:  Follow up in 6 months     Ivonne Andrew, NP 05/02/2023

## 2023-05-09 ENCOUNTER — Other Ambulatory Visit: Payer: Self-pay

## 2023-05-20 ENCOUNTER — Other Ambulatory Visit: Payer: Self-pay

## 2023-05-21 ENCOUNTER — Other Ambulatory Visit: Payer: Self-pay

## 2023-05-29 ENCOUNTER — Telehealth: Payer: Self-pay | Admitting: Nurse Practitioner

## 2023-05-29 NOTE — Telephone Encounter (Signed)
Error - called patient and LVM to make appt

## 2023-06-01 ENCOUNTER — Other Ambulatory Visit: Payer: Self-pay | Admitting: Internal Medicine

## 2023-06-01 DIAGNOSIS — K219 Gastro-esophageal reflux disease without esophagitis: Secondary | ICD-10-CM

## 2023-07-05 ENCOUNTER — Ambulatory Visit: Payer: Self-pay | Admitting: Nurse Practitioner

## 2023-07-10 ENCOUNTER — Ambulatory Visit: Payer: Self-pay | Admitting: Nurse Practitioner

## 2023-07-12 ENCOUNTER — Ambulatory Visit: Payer: Self-pay | Admitting: Nurse Practitioner

## 2023-07-13 ENCOUNTER — Emergency Department (HOSPITAL_COMMUNITY): Payer: No Typology Code available for payment source

## 2023-07-13 ENCOUNTER — Other Ambulatory Visit: Payer: Self-pay

## 2023-07-13 ENCOUNTER — Emergency Department (HOSPITAL_COMMUNITY)
Admission: EM | Admit: 2023-07-13 | Discharge: 2023-07-14 | Disposition: A | Discharge status: Home / Self Care | Payer: No Typology Code available for payment source | Attending: Emergency Medicine | Admitting: Emergency Medicine

## 2023-07-13 DIAGNOSIS — R58 Hemorrhage, not elsewhere classified: Secondary | ICD-10-CM

## 2023-07-13 DIAGNOSIS — S8011XA Contusion of right lower leg, initial encounter: Secondary | ICD-10-CM | POA: Diagnosis not present

## 2023-07-13 DIAGNOSIS — R519 Headache, unspecified: Secondary | ICD-10-CM | POA: Diagnosis not present

## 2023-07-13 DIAGNOSIS — Z23 Encounter for immunization: Secondary | ICD-10-CM | POA: Diagnosis not present

## 2023-07-13 DIAGNOSIS — S8012XA Contusion of left lower leg, initial encounter: Secondary | ICD-10-CM | POA: Diagnosis not present

## 2023-07-13 DIAGNOSIS — Y92410 Unspecified street and highway as the place of occurrence of the external cause: Secondary | ICD-10-CM | POA: Diagnosis not present
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
## 2023-07-14 ENCOUNTER — Emergency Department (HOSPITAL_COMMUNITY): Payer: No Typology Code available for payment source

## 2023-07-14 MED ORDER — TETANUS-DIPHTH-ACELL PERTUSSIS 5-2.5-18.5 LF-MCG/0.5 IM SUSY
0.5000 mL | PREFILLED_SYRINGE | Freq: Once | INTRAMUSCULAR | Status: AC
  Administered 2023-07-14: 0.5 mL via INTRAMUSCULAR
  Filled 2023-07-14: qty 0.5

## 2023-07-14 MED ORDER — KETOROLAC TROMETHAMINE 30 MG/ML IJ SOLN
30.0000 mg | Freq: Once | INTRAMUSCULAR | Status: AC
  Administered 2023-07-14: 30 mg via INTRAMUSCULAR
  Filled 2023-07-14: qty 1

## 2023-07-14 MED ORDER — DICLOFENAC SODIUM ER 100 MG PO TB24
100.0000 mg | ORAL_TABLET | Freq: Every day | ORAL | 0 refills | Status: DC
  Filled 2023-07-14 – 2024-02-17 (×2): qty 10, 10d supply, fill #0

## 2023-07-14 MED ORDER — LIDOCAINE 5 % EX PTCH
1.0000 | MEDICATED_PATCH | CUTANEOUS | 0 refills | Status: DC
  Filled 2023-07-14 – 2024-02-17 (×5): qty 30, 30d supply, fill #0

## 2023-07-14 MED ORDER — METHOCARBAMOL 500 MG PO TABS
1000.0000 mg | ORAL_TABLET | Freq: Once | ORAL | Status: AC
  Administered 2023-07-14: 1000 mg via ORAL
  Filled 2023-07-14: qty 2

## 2023-07-14 MED ORDER — METAXALONE 800 MG PO TABS
800.0000 mg | ORAL_TABLET | Freq: Three times a day (TID) | ORAL | 0 refills | Status: DC
  Filled 2023-07-14 – 2024-02-17 (×5): qty 21, 7d supply, fill #0
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
## 2023-07-14 NOTE — ED Notes (Signed)
Pt provided with 8 packs of graham crackers and 3 colas.

## 2023-07-14 NOTE — ED Notes (Signed)
Dr. Nicanor Alcon at bedside to reexamine lower extremities which patient continues to complain hurt. Provider also made patient aware of his xray and CT results. Pt requesting ice bag at this time. Will provide this for the patient.

## 2023-07-14 NOTE — ED Notes (Signed)
Pt made aware that if he was waiting on a ride he would have to wait in the lobby.

## 2023-07-14 NOTE — ED Notes (Signed)
Pt PO challenged with cola and graham crackers and is tolerating well.

## 2023-07-14 NOTE — ED Notes (Signed)
Pt stood from bed tore his discharge papers up in the trash can and ambulated out of the room without difficulty. Pt started to walk out then came back to go in the bathroom prior to ambulating out of the unit.

## 2023-07-14 NOTE — ED Notes (Signed)
DC papers reviewed with patient. Medications sent to a pharmacy that is M-F. Asked pt what pharmacy he wanted the meds sent to and he stated it would be okay to leave them at the listed pharmacy and he would pick them up on Monday. Asked pt again and he continues to be okay with leaving them at the listed pharmacy. Pt able to move all extremities without difficulty. Advised patient that the soreness that he currently feels will probably get worse before it gets better. Pt understands and still request to leave the medication at the pharmacy that wont be open again until Monday.

## 2023-07-15 ENCOUNTER — Other Ambulatory Visit: Payer: Self-pay

## 2023-07-15 ENCOUNTER — Telehealth: Payer: Self-pay

## 2023-07-15 NOTE — Transitions of Care (Post Inpatient/ED Visit) (Signed)
   07/15/2023  Name: Todd Carroll MRN: [id number] DOB: Feb 21, 1976  Today's TOC FU Call Status: Today's TOC FU Call Status:: Unsuccessful Call (1st Attempt) Unsuccessful Call (1st Attempt) Date: 07/15/23  Attempted to reach the patient regarding the most recent Inpatient/ED visit.  Follow Up Plan: Additional outreach attempts will be made to reach the patient to complete the Transitions of Care (Post Inpatient/ED visit) call.   Signature .Renelda Loma RMA

## 2023-07-18 ENCOUNTER — Other Ambulatory Visit: Payer: Self-pay | Admitting: Internal Medicine

## 2023-07-18 DIAGNOSIS — K219 Gastro-esophageal reflux disease without esophagitis: Secondary | ICD-10-CM

## 2023-07-20 ENCOUNTER — Other Ambulatory Visit: Payer: Self-pay | Admitting: Nurse Practitioner

## 2023-07-20 DIAGNOSIS — K219 Gastro-esophageal reflux disease without esophagitis: Secondary | ICD-10-CM

## 2023-07-22 ENCOUNTER — Other Ambulatory Visit: Payer: Self-pay

## 2023-07-24 ENCOUNTER — Other Ambulatory Visit: Payer: Self-pay

## 2023-07-25 ENCOUNTER — Other Ambulatory Visit: Payer: Self-pay | Admitting: Nurse Practitioner

## 2023-07-25 DIAGNOSIS — L299 Pruritus, unspecified: Secondary | ICD-10-CM

## 2023-07-25 NOTE — Telephone Encounter (Signed)
Please advise KH 

## 2023-08-16 ENCOUNTER — Other Ambulatory Visit: Payer: Self-pay

## 2023-08-19 ENCOUNTER — Ambulatory Visit: Payer: Self-pay | Admitting: Nurse Practitioner

## 2023-08-23 ENCOUNTER — Other Ambulatory Visit: Payer: Self-pay

## 2023-10-07 ENCOUNTER — Other Ambulatory Visit: Payer: Self-pay | Admitting: Nurse Practitioner

## 2023-10-07 DIAGNOSIS — K219 Gastro-esophageal reflux disease without esophagitis: Secondary | ICD-10-CM
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
## 2023-10-16 ENCOUNTER — Other Ambulatory Visit: Payer: Self-pay | Admitting: Nurse Practitioner

## 2023-10-16 DIAGNOSIS — K219 Gastro-esophageal reflux disease without esophagitis: Secondary | ICD-10-CM

## 2023-11-01 ENCOUNTER — Ambulatory Visit: Payer: Self-pay | Admitting: Nurse Practitioner

## 2023-12-26 ENCOUNTER — Other Ambulatory Visit (HOSPITAL_COMMUNITY): Payer: Self-pay

## 2023-12-31 ENCOUNTER — Other Ambulatory Visit (HOSPITAL_COMMUNITY): Payer: Self-pay

## 2023-12-31 ENCOUNTER — Telehealth: Payer: Self-pay

## 2023-12-31 NOTE — Telephone Encounter (Signed)
[facility] Patient Advocate Encounter  Insurance verification completed.    The patient is uninsured and will need patient assistance for medication.  We can complete the application and will need to meet with the patient for signatures and income documentation.

## 2024-01-01 ENCOUNTER — Ambulatory Visit: Payer: Self-pay | Admitting: Internal Medicine

## 2024-01-01 ENCOUNTER — Ambulatory Visit: Payer: Self-pay

## 2024-01-31 ENCOUNTER — Ambulatory Visit: Payer: Self-pay

## 2024-02-04 ENCOUNTER — Other Ambulatory Visit: Payer: Self-pay

## 2024-02-04 ENCOUNTER — Ambulatory Visit: Payer: Self-pay

## 2024-02-05 ENCOUNTER — Encounter: Payer: Self-pay | Admitting: Nurse Practitioner

## 2024-02-05 ENCOUNTER — Ambulatory Visit (INDEPENDENT_AMBULATORY_CARE_PROVIDER_SITE_OTHER): Payer: Self-pay | Admitting: Nurse Practitioner

## 2024-02-05 ENCOUNTER — Other Ambulatory Visit: Payer: Self-pay

## 2024-02-05 VITALS — BP 136/98 | HR 93 | Temp 97.9°F | Wt 232.4 lb

## 2024-02-05 DIAGNOSIS — Z1329 Encounter for screening for other suspected endocrine disorder: Secondary | ICD-10-CM | POA: Diagnosis not present

## 2024-02-05 DIAGNOSIS — Z13220 Encounter for screening for lipoid disorders: Secondary | ICD-10-CM | POA: Diagnosis not present

## 2024-02-05 DIAGNOSIS — R7303 Prediabetes: Secondary | ICD-10-CM

## 2024-02-05 DIAGNOSIS — A6001 Herpesviral infection of penis: Secondary | ICD-10-CM

## 2024-02-05 DIAGNOSIS — Z0000 Encounter for general adult medical examination without abnormal findings: Secondary | ICD-10-CM | POA: Diagnosis not present

## 2024-02-05 DIAGNOSIS — R059 Cough, unspecified: Secondary | ICD-10-CM

## 2024-02-05 DIAGNOSIS — Z23 Encounter for immunization: Secondary | ICD-10-CM

## 2024-02-05 DIAGNOSIS — Z1211 Encounter for screening for malignant neoplasm of colon: Secondary | ICD-10-CM

## 2024-02-05 DIAGNOSIS — Z113 Encounter for screening for infections with a predominantly sexual mode of transmission: Secondary | ICD-10-CM

## 2024-02-05 DIAGNOSIS — Z131 Encounter for screening for diabetes mellitus: Secondary | ICD-10-CM

## 2024-02-05 DIAGNOSIS — Z125 Encounter for screening for malignant neoplasm of prostate: Secondary | ICD-10-CM

## 2024-02-05 LAB — POCT GLYCOSYLATED HEMOGLOBIN (HGB A1C): Hemoglobin A1C: 5.9 % — AB (ref 4.0–5.6)

## 2024-02-05 MED ORDER — TIZANIDINE HCL 4 MG PO TABS
4.0000 mg | ORAL_TABLET | Freq: Two times a day (BID) | ORAL | 0 refills | Status: DC | PRN
  Filled 2024-02-05 – 2024-12-22 (×3): qty 60, 30d supply, fill #0

## 2024-02-05 MED ORDER — ALBUTEROL SULFATE HFA 108 (90 BASE) MCG/ACT IN AERS
1.0000 | INHALATION_SPRAY | Freq: Four times a day (QID) | RESPIRATORY_TRACT | 2 refills | Status: DC | PRN
Start: 2024-02-05 — End: 2024-02-05
  Filled 2024-02-05: qty 6.7, 10d supply, fill #0

## 2024-02-05 MED ORDER — VALACYCLOVIR HCL 500 MG PO TABS
500.0000 mg | ORAL_TABLET | Freq: Every day | ORAL | 11 refills | Status: DC
Start: 2024-02-05 — End: 2024-09-11
  Filled 2024-02-05 – 2024-06-12 (×6): qty 30, 30d supply, fill #0
  Filled 2024-07-15 (×2): qty 30, 30d supply, fill #1
  Filled 2024-08-07: qty 30, 30d supply, fill #2
  Filled 2024-09-07: qty 30, 30d supply, fill #3

## 2024-02-05 MED ORDER — SODIUM FLUORIDE 1.1 % DT CREA
TOPICAL_CREAM | DENTAL | 6 refills | Status: DC
  Filled 2024-02-05 – 2024-05-28 (×4): qty 51, 30d supply, fill #0

## 2024-02-05 MED ORDER — LUMINEUX WHITENING TOOTHPASTE DT PSTE
1.0000 | PASTE | Freq: Every day | DENTAL | 2 refills | Status: DC
  Filled 2024-02-05: qty 106, fill #0

## 2024-02-05 MED ORDER — MOXIFLOXACIN HCL 0.5 % OP SOLN
1.0000 [drp] | Freq: Three times a day (TID) | OPHTHALMIC | 0 refills | Status: DC
  Filled 2024-02-05: qty 3, 10d supply, fill #0
  Filled 2024-02-17 – 2024-11-10 (×2): qty 3, 20d supply, fill #0

## 2024-02-05 MED ORDER — ALBUTEROL SULFATE HFA 108 (90 BASE) MCG/ACT IN AERS
1.0000 | INHALATION_SPRAY | Freq: Four times a day (QID) | RESPIRATORY_TRACT | 2 refills | Status: AC | PRN
  Filled 2024-02-05: qty 6.7, 25d supply, fill #0
  Filled 2024-02-17: qty 6.7, 10d supply, fill #0
  Filled 2024-05-28 – 2024-12-17 (×3): qty 18, 25d supply, fill #0
  Filled 2024-12-20 – 2024-12-21 (×2): qty 18, 68d supply, fill #1

## 2024-02-05 MED ORDER — CLINDAMYCIN PHOSPHATE 1 % EX GEL
Freq: Two times a day (BID) | CUTANEOUS | 0 refills | Status: DC
  Filled 2024-02-05 – 2024-02-17 (×2): qty 30, 30d supply, fill #0

## 2024-02-05 MED ORDER — HYDROXYZINE PAMOATE 50 MG PO CAPS
50.0000 mg | ORAL_CAPSULE | Freq: Three times a day (TID) | ORAL | 2 refills | Status: DC | PRN
Start: 2024-02-05 — End: 2024-05-26
  Filled 2024-02-05 – 2024-02-17 (×2): qty 30, 10d supply, fill #0
  Filled 2024-05-26 (×2): qty 90, 30d supply, fill #0

## 2024-02-05 MED ORDER — ROSUVASTATIN CALCIUM 10 MG PO TABS
10.0000 mg | ORAL_TABLET | Freq: Every day | ORAL | 11 refills | Status: DC
  Filled 2024-02-05 – 2024-02-17 (×2): qty 30, 30d supply, fill #0

## 2024-02-05 MED ORDER — TRIAMCINOLONE ACETONIDE 0.5 % EX OINT
1.0000 | TOPICAL_OINTMENT | Freq: Two times a day (BID) | CUTANEOUS | 0 refills | Status: DC
  Filled 2024-02-05 – 2024-10-30 (×4): qty 30, 15d supply, fill #0

## 2024-02-05 MED ORDER — ONE-A-DAY MENS PO TABS
1.0000 | ORAL_TABLET | Freq: Every day | ORAL | 0 refills | Status: DC
  Filled 2024-02-05 – 2024-02-17 (×2): qty 100, 100d supply, fill #0

## 2024-02-05 MED ORDER — FEXOFENADINE HCL 180 MG PO TABS
180.0000 mg | ORAL_TABLET | Freq: Every day | ORAL | 2 refills | Status: DC
  Filled 2024-02-05: qty 30, 30d supply, fill #0
  Filled 2024-02-17: qty 90, 90d supply, fill #0

## 2024-02-05 NOTE — Progress Notes (Signed)
 Subjective   Patient ID: Todd Carroll, male    DOB: 20-Oct-1976, 48 y.o.   MRN: [id number]  Chief Complaint  Patient presents with   Medical Management of Chronic Issues    Patient stated that he need refills     Referring provider: Ivonne Andrew, NP  Todd Carroll is a 48 y.o. male with Past Medical History: 08/27/2007: ALLERGIC RHINITIS No date: Allergy 05/04/2008: GERD   HPI  Patient presents today for a follow-up and medication refills.  He was last seen in this office on 05/02/2023.  He does have a history of diabetes.  Last A1c at that visit was 6.0. A1C today is 5.9. Denies f/c/s, n/v/d, hemoptysis, PND, leg swelling Denies chest pain or edema.     Allergies  Allergen Reactions   Azithromycin Itching    Immunization History  Administered Date(s) Administered   HPV 9-valent 05/15/2018, 06/13/2018   Hepatitis A 04/02/2013   Hepatitis A, Adult 10/23/2013   Hepatitis B 04/02/2013, 06/02/2013   Hepatitis B, ADULT 01/05/2014   Influenza Split 09/14/2011   Influenza, Seasonal, Injecte, Preservative Fre 02/05/2024   Influenza,inj,Quad PF,6+ Mos 10/23/2013, 11/01/2014, 11/15/2015, 08/31/2016, 08/31/2016, 07/24/2017, 09/11/2018, 09/16/2020, 11/02/2021   Meningococcal Mcv4o 03/06/2017, 09/12/2018   PFIZER(Purple Top)SARS-COV-2 Vaccination 03/23/2020, 04/12/2020, 11/08/2020   PPD Test 03/19/2013   Pneumococcal Conjugate-13 05/15/2018   Pneumococcal Polysaccharide-23 03/19/2013, 12/31/2018   Td 12/03/2006   Tdap 07/24/2017, 07/14/2023   Vaccinia,smallpox Monkeypox Vaccine Live,pf 08/03/2021    Tobacco History: Social History   Tobacco Use  Smoking Status Never  Smokeless Tobacco Never   Counseling given: Not Answered   Outpatient Encounter Medications as of 02/05/2024  Medication Sig   bictegravir-emtricitabine-tenofovir AF (BIKTARVY) 50-200-25 MG TABS tablet Take 1 tablet by mouth daily.   Dentifrices (LUMINEUX WHITENING TOOTHPASTE) PSTE Place 1  Application onto teeth daily.   lidocaine (LIDODERM) 5 % Place 1 patch onto the skin daily. Remove & Discard patch within 12 hours or as directed by MD   meloxicam (MOBIC) 7.5 MG tablet Take 1 tablet (7.5 mg total) by mouth daily as needed for pain.   omeprazole (PRILOSEC) 40 MG capsule TAKE 1 CAPSULE(40 MG) BY MOUTH DAILY   [DISCONTINUED] albuterol (VENTOLIN HFA) 108 (90 Base) MCG/ACT inhaler Inhale 1-2 puffs into the lungs every 6 (six) hours as needed for wheezing or shortness of breath.   [DISCONTINUED] fexofenadine (ALLEGRA ALLERGY) 180 MG tablet Take 1 tablet (180 mg total) by mouth daily.   [DISCONTINUED] hydrOXYzine (VISTARIL) 50 MG capsule TAKE 1 CAPSULE(50 MG) BY MOUTH THREE TIMES DAILY AS NEEDED   [DISCONTINUED] moxifloxacin (VIGAMOX) 0.5 % ophthalmic solution Place 1 drop into both eyes 3 (three) times daily.   [DISCONTINUED] multivitamin (ONE-A-DAY MEN'S) TABS tablet Take 1 tablet by mouth daily.   [DISCONTINUED] rosuvastatin (CRESTOR) 10 MG tablet Take 1 tablet (10 mg total) by mouth daily.   [DISCONTINUED] sodium fluoride (PREVIDENT 5000 PLUS) 1.1 % CREA dental cream Use a pea sized amount of this toothpaste in the evening   [DISCONTINUED] tiZANidine (ZANAFLEX) 4 MG tablet Take 1 tablet (4 mg total) by mouth 2 (two) times daily as needed.   [DISCONTINUED] valACYclovir (VALTREX) 500 MG tablet Take 1 tablet (500 mg total) by mouth daily.   albuterol (VENTOLIN HFA) 108 (90 Base) MCG/ACT inhaler Inhale 1-2 puffs into the lungs every 6 (six) hours as needed for wheezing or shortness of breath.   amoxicillin-clavulanate (AUGMENTIN) 875-125 MG tablet Take 1 tablet by mouth 2 (two) times daily. (  Patient not taking: Reported on 02/05/2024)   Blood Glucose Monitoring Suppl (TRUE METRIX METER) w/Device KIT Use as directed up to 4 times daily. (Patient not taking: Reported on 02/05/2024)   Diclofenac Sodium CR 100 MG 24 hr tablet Take 1 tablet (100 mg total) by mouth daily. (Patient not taking:  Reported on 02/05/2024)   doxycycline (VIBRA-TABS) 100 MG tablet Take 1 tablet (100 mg total) by mouth 2 (two) times daily. (Patient not taking: Reported on 02/05/2024)   fexofenadine (ALLEGRA ALLERGY) 180 MG tablet Take 1 tablet (180 mg total) by mouth daily.   hydrOXYzine (VISTARIL) 50 MG capsule Take 1 capsule (50 mg total) by mouth 3 (three) times daily as needed.   metaxalone (SKELAXIN) 800 MG tablet Take 1 tablet (800 mg total) by mouth 3 (three) times daily. (Patient not taking: Reported on 02/05/2024)   moxifloxacin (VIGAMOX) 0.5 % ophthalmic solution Place 1 drop into both eyes 3 (three) times daily.   multivitamin (ONE-A-DAY MEN'S) TABS tablet Take 1 tablet by mouth daily.   promethazine (PHENERGAN) 25 MG tablet Take 1 tablet (25 mg total) by mouth every 6 (six) hours as needed for nausea or vomiting. (Patient not taking: Reported on 02/05/2024)   rosuvastatin (CRESTOR) 10 MG tablet Take 1 tablet (10 mg total) by mouth daily.   sodium fluoride (PREVIDENT 5000 PLUS) 1.1 % CREA dental cream Use a pea sized amount of this toothpaste in the evening   tiZANidine (ZANAFLEX) 4 MG tablet Take 1 tablet (4 mg total) by mouth 2 (two) times daily as needed.   valACYclovir (VALTREX) 500 MG tablet Take 1 tablet (500 mg total) by mouth daily.   [DISCONTINUED] albuterol (VENTOLIN HFA) 108 (90 Base) MCG/ACT inhaler Inhale 1-2 puffs into the lungs every 6 (six) hours as needed for wheezing or shortness of breath.   [DISCONTINUED] hydrOXYzine (VISTARIL) 50 MG capsule Take 1 capsule (50 mg total) by mouth 3 (three) times daily as needed.   [DISCONTINUED] hydrOXYzine (VISTARIL) 50 MG capsule TAKE 1 CAPSULE(50 MG) BY MOUTH THREE TIMES DAILY AS NEEDED   No facility-administered encounter medications on file as of 02/05/2024.    Review of Systems  Review of Systems  Constitutional: Negative.   HENT: Negative.    Cardiovascular: Negative.   Gastrointestinal: Negative.   Allergic/Immunologic: Negative.    Neurological: Negative.   Psychiatric/Behavioral: Negative.       Objective:   BP (!) 136/98   Pulse 93   Temp 97.9 F (36.6 C)   Wt 232 lb 6.4 oz (105.4 kg)   SpO2 99%   BMI 30.66 kg/m   Wt Readings from Last 5 Encounters:  02/05/24 232 lb 6.4 oz (105.4 kg)  07/13/23 257 lb (116.6 kg)  05/02/23 257 lb (116.6 kg)  02/04/23 269 lb (122 kg)  07/20/22 273 lb 3.2 oz (123.9 kg)     Physical Exam Vitals and nursing note reviewed.  Constitutional:      General: He is not in acute distress.    Appearance: He is well-developed.  Cardiovascular:     Rate and Rhythm: Normal rate and regular rhythm.  Pulmonary:     Effort: Pulmonary effort is normal.     Breath sounds: Normal breath sounds.  Skin:    General: Skin is warm and dry.  Neurological:     Mental Status: He is alert and oriented to person, place, and time.       Assessment & Plan:   Immunization due -     Flu  vaccine trivalent PF, 6mos and older(Flulaval,Afluria,Fluarix,Fluzone)  Diabetes mellitus screening -     POCT glycosylated hemoglobin (Hb A1C)  Prediabetes -     POCT glycosylated hemoglobin (Hb A1C)  Colon cancer screening -     Ambulatory referral to Gastroenterology  Screen for STD (sexually transmitted disease) -     Chlamydia/Gonococcus/Trichomonas, NAA  Cough, unspecified type -     Albuterol Sulfate HFA; Inhale 1-2 puffs into the lungs every 6 (six) hours as needed for wheezing or shortness of breath.  Dispense: 6.7 g; Refill: 2  Herpes simplex infection of penis -     valACYclovir HCl; Take 1 tablet (500 mg total) by mouth daily.  Dispense: 30 tablet; Refill: 11  Prostate cancer screening -     PSA  Thyroid disorder screen -     TSH  Lipid screening -     Lipid panel  Routine adult health maintenance -     CBC -     Comprehensive metabolic panel -     Lipid panel -     TSH -     PSA  Other orders -     One-A-Day Mens; Take 1 tablet by mouth daily.  Dispense: 100 tablet;  Refill: 0 -     Fexofenadine HCl; Take 1 tablet (180 mg total) by mouth daily.  Dispense: 90 tablet; Refill: 2 -     hydrOXYzine Pamoate; Take 1 capsule (50 mg total) by mouth 3 (three) times daily as needed.  Dispense: 30 capsule; Refill: 2 -     Moxifloxacin HCl; Place 1 drop into both eyes 3 (three) times daily.  Dispense: 3 mL; Refill: 0 -     Rosuvastatin Calcium; Take 1 tablet (10 mg total) by mouth daily.  Dispense: 30 tablet; Refill: 11 -     Sodium Fluoride; Use a pea sized amount of this toothpaste in the evening  Dispense: 51 g; Refill: 6 -     tiZANidine HCl; Take 1 tablet (4 mg total) by mouth 2 (two) times daily as needed.  Dispense: 60 tablet; Refill: 0 -     Lumineux Whitening Toothpaste; Place 1 Application onto teeth daily.  Dispense: 106 g; Refill: 2     Return in about 3 months (around 05/07/2024).     Ivonne Andrew, NP 02/05/2024

## 2024-02-05 NOTE — Patient Instructions (Signed)
 1. Immunization due (Primary)  - Flu vaccine trivalent PF, 6mos and older(Flulaval,Afluria,Fluarix,Fluzone)  2. Diabetes mellitus screening  - POCT glycosylated hemoglobin (Hb A1C)  3. Prediabetes  - POCT glycosylated hemoglobin (Hb A1C)  4. Colon cancer screening  - Ambulatory referral to Gastroenterology  5. Screen for STD (sexually transmitted disease)  - Chlamydia/Gonococcus/Trichomonas, NAA  6. Cough, unspecified type  - albuterol (VENTOLIN HFA) 108 (90 Base) MCG/ACT inhaler; Inhale 1-2 puffs into the lungs every 6 (six) hours as needed for wheezing or shortness of breath.  Dispense: 6.7 g; Refill: 2  7. Herpes simplex infection of penis  - valACYclovir (VALTREX) 500 MG tablet; Take 1 tablet (500 mg total) by mouth daily.  Dispense: 30 tablet; Refill: 11

## 2024-02-06 ENCOUNTER — Other Ambulatory Visit: Payer: Self-pay

## 2024-02-06 LAB — COMPREHENSIVE METABOLIC PANEL
ALT: 15 IU/L (ref 0–44)
AST: 27 IU/L (ref 0–40)
Albumin: 4.3 g/dL (ref 4.1–5.1)
Alkaline Phosphatase: 111 IU/L (ref 44–121)
BUN/Creatinine Ratio: 13 (ref 9–20)
BUN: 17 mg/dL (ref 6–24)
Bilirubin Total: <0.2 mg/dL (ref 0.0–1.2)
CO2: 24 mmol/L (ref 20–29)
Calcium: 9.4 mg/dL (ref 8.7–10.2)
Chloride: 102 mmol/L (ref 96–106)
Creatinine, Ser: 1.27 mg/dL (ref 0.76–1.27)
Globulin, Total: 3.2 g/dL (ref 1.5–4.5)
Glucose: 89 mg/dL (ref 70–99)
Potassium: 4.3 mmol/L (ref 3.5–5.2)
Sodium: 140 mmol/L (ref 134–144)
Total Protein: 7.5 g/dL (ref 6.0–8.5)
eGFR: 70 mL/min/{1.73_m2} (ref >59)

## 2024-02-06 LAB — LIPID PANEL
Chol/HDL Ratio: 3.9 ratio (ref 0.0–5.0)
Cholesterol, Total: 197 mg/dL (ref 100–199)
HDL: 51 mg/dL (ref >39)
LDL Chol Calc (NIH): 97 mg/dL (ref 0–99)
Triglycerides: 290 mg/dL — ABNORMAL HIGH (ref 0–149)
VLDL Cholesterol Cal: 49 mg/dL — ABNORMAL HIGH (ref 5–40)

## 2024-02-06 LAB — CBC
Hematocrit: 38.2 % (ref 37.5–51.0)
Hemoglobin: 12.6 g/dL — ABNORMAL LOW (ref 13.0–17.7)
MCH: 30.4 pg (ref 26.6–33.0)
MCHC: 33 g/dL (ref 31.5–35.7)
MCV: 92 fL (ref 79–97)
Platelets: 425 10*3/uL (ref 150–450)
RBC: 4.15 x10E6/uL (ref 4.14–5.80)
RDW: 13.6 % (ref 11.6–15.4)
WBC: 9.2 10*3/uL (ref 3.4–10.8)

## 2024-02-06 LAB — PSA: Prostate Specific Ag, Serum: 1 ng/mL (ref 0.0–4.0)

## 2024-02-06 LAB — TSH: TSH: 1.42 u[IU]/mL (ref 0.450–4.500)

## 2024-02-07 ENCOUNTER — Other Ambulatory Visit: Payer: Self-pay | Admitting: Pharmacist

## 2024-02-07 LAB — CHLAMYDIA/GONOCOCCUS/TRICHOMONAS, NAA
Chlamydia by NAA: NEGATIVE
Gonococcus by NAA: NEGATIVE
Trich vag by NAA: NEGATIVE

## 2024-02-07 MED ORDER — BICTEGRAVIR-EMTRICITAB-TENOFOV 50-200-25 MG PO TABS: 1.0000 | ORAL_TABLET | Freq: Every day | ORAL | Status: AC

## 2024-02-07 NOTE — Progress Notes (Signed)
 Medication Samples have been provided to the patient.  Drug name: Biktarvy        Strength: 50/200/25 mg       Qty: 7 tablets (1 bottles) LOT: [id number]   Exp.Date: 6/27  Dosing instructions: Take one tablet by mouth once daily  The patient has been instructed regarding the correct time, dose, and frequency of taking this medication, including desired effects and most common side effects.   Margarite Gouge, PharmD, CPP, BCIDP, AAHIVP Clinical Pharmacist Practitioner Infectious Diseases Clinical Pharmacist [facility]

## 2024-02-08 ENCOUNTER — Other Ambulatory Visit: Payer: Self-pay | Admitting: Internal Medicine

## 2024-02-08 DIAGNOSIS — B20 Human immunodeficiency virus [HIV] disease: Secondary | ICD-10-CM

## 2024-02-10 NOTE — Telephone Encounter (Signed)
Appt 3/14

## 2024-02-11 ENCOUNTER — Telehealth: Payer: Self-pay

## 2024-02-11 ENCOUNTER — Ambulatory Visit: Payer: Self-pay | Admitting: Nurse Practitioner

## 2024-02-11 NOTE — Telephone Encounter (Signed)
 Patient called in asking further questions about provider comments on lab results. When transferred to nurse triage, patient had disconnected from line. This RN attempted return call but no answer. LVM.  Copied from CRM [id number]. Topic: Clinical - Lab/Test Results >> Feb 11, 2024  4:12 PM Franchot Heidelberg wrote: Reason for CRM: Has questions about his labs

## 2024-02-11 NOTE — Telephone Encounter (Signed)
 Copied from CRM [id number]. Topic: General - Other >> Feb 10, 2024  2:05 PM Abundio Miu S wrote: Reason for CRM: Att: Aggie Cosier- Patient returning missed phone call.

## 2024-02-14 ENCOUNTER — Telehealth: Payer: Self-pay

## 2024-02-14 ENCOUNTER — Ambulatory Visit: Payer: Self-pay | Admitting: Infectious Diseases

## 2024-02-14 ENCOUNTER — Other Ambulatory Visit: Payer: Self-pay

## 2024-02-14 NOTE — Telephone Encounter (Signed)
 Pt unable to make to appt and nursing staff was in communication with pt for rescheduling. I was able to assist due to trouble with the scheduling template and pt is currently scheduled for 03/20 with Marcos Eke NP. He stated that he was not reminded the day before his appt. I did confirm with nursing staff that with the number he provided that the auto reminder system sent him communication on 02/07/2024 and the result stated that it was confirmed.   I did try to explain that with this result it is notified to our office as confirmed. We will reach out to patient's who have not responded to the attempts of the auto system as a last resort to ensure they are aware. Added note to appt notes that regardless of confirmation status to call and confirm.

## 2024-02-17 ENCOUNTER — Other Ambulatory Visit: Payer: Self-pay

## 2024-02-20 ENCOUNTER — Ambulatory Visit: Admitting: Family

## 2024-02-24 ENCOUNTER — Other Ambulatory Visit: Payer: Self-pay | Admitting: Internal Medicine

## 2024-02-24 DIAGNOSIS — B20 Human immunodeficiency virus [HIV] disease: Secondary | ICD-10-CM

## 2024-02-25 ENCOUNTER — Telehealth: Payer: Self-pay

## 2024-02-25 NOTE — Telephone Encounter (Signed)
 Refill was sent 3/10. Patient needs appointment.   Sandie Ano, RN

## 2024-02-25 NOTE — Telephone Encounter (Signed)
 Received refill request for Biktarvy. 30 day supply was sent 3/10. Patient missed appointments on 3/20, 3/14, and 1/29.  Called him this morning to schedule appointment, call could not be completed on either number.   Sandie Ano, RN

## 2024-02-26 ENCOUNTER — Other Ambulatory Visit: Payer: Self-pay

## 2024-03-25 ENCOUNTER — Other Ambulatory Visit: Payer: Self-pay

## 2024-04-08 ENCOUNTER — Ambulatory Visit

## 2024-04-10 ENCOUNTER — Ambulatory Visit: Admitting: Internal Medicine

## 2024-04-15 NOTE — Progress Notes (Unsigned)
 HPI: Todd Carroll is a 48 y.o. male who presents to the [facility] pharmacy clinic for HIV follow-up.  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 07/24/2019   Underinsured 07/24/2019   Dysphagia 10/23/2018   Constipation 10/23/2018   Hematospermia 11/01/2014   Scalp pruritus 07/25/2014   Itch 05/25/2014   Rash and nonspecific skin eruption 01/05/2014   Folliculitis 07/13/2013   Pain, dental 07/13/2013   Dysuria 04/29/2013   Human immunodeficiency virus (HIV) disease (HCC) 02/27/2013   GERD 05/04/2008   Allergic rhinitis 08/27/2007    Patient's Medications  New Prescriptions   No medications on file  Previous Medications   ALBUTEROL  (VENTOLIN  HFA) 108 (90 BASE) MCG/ACT INHALER    Inhale 1-2 puffs into the lungs every 6 (six) hours as needed for wheezing or shortness of breath.   AMOXICILLIN -CLAVULANATE (AUGMENTIN ) 875-125 MG TABLET    Take 1 tablet by mouth 2 (two) times daily.   BIKTARVY  50-200-25 MG TABS TABLET    TAKE 1 TABLET BY MOUTH DAILY   BLOOD GLUCOSE MONITORING SUPPL (TRUE METRIX METER) W/DEVICE KIT    Use as directed up to 4 times daily.   CLINDAMYCIN  (CLINDAGEL ) 1 % GEL    Apply topically 2 (two) times daily.   DENTIFRICES (LUMINEUX WHITENING TOOTHPASTE) PSTE    Place 1 Application onto teeth daily.   DICLOFENAC  SODIUM CR 100 MG 24 HR TABLET    Take 1 tablet (100 mg total) by mouth daily.   DOXYCYCLINE  (VIBRA -TABS) 100 MG TABLET    Take 1 tablet (100 mg total) by mouth 2 (two) times daily.   FEXOFENADINE  (ALLEGRA  ALLERGY) 180 MG TABLET    Take 1 tablet (180 mg total) by mouth daily.   HYDROXYZINE  (VISTARIL ) 50 MG CAPSULE    Take 1 capsule (50 mg total) by mouth 3 (three) times daily as needed.   LIDOCAINE  (LIDODERM ) 5 %    Place 1 patch onto the skin daily. Remove &  Discard patch within 12 hours or as directed by MD   MELOXICAM  (MOBIC ) 7.5 MG TABLET    Take 1 tablet (7.5 mg total) by mouth daily as needed for pain.   METAXALONE  (SKELAXIN ) 800 MG TABLET    Take 1 tablet (800 mg total) by mouth 3 (three) times daily.   MOXIFLOXACIN  (VIGAMOX ) 0.5 % OPHTHALMIC SOLUTION    Place 1 drop into both eyes 3 (three) times daily.   MULTIVITAMIN (ONE-A-DAY MEN'S) TABS TABLET    Take 1 tablet by mouth daily.   OMEPRAZOLE  (PRILOSEC) 40 MG CAPSULE    TAKE 1 CAPSULE(40 MG) BY MOUTH DAILY   PROMETHAZINE  (PHENERGAN ) 25 MG TABLET    Take 1 tablet (25 mg total) by mouth every 6 (six) hours as needed for nausea or vomiting.   ROSUVASTATIN  (CRESTOR ) 10 MG TABLET    Take 1 tablet (10 mg total) by mouth daily.   SODIUM FLUORIDE  (PREVIDENT 5000 PLUS) 1.1 % CREA DENTAL CREAM    Use a pea sized amount of this toothpaste in the evening   TIZANIDINE  (ZANAFLEX ) 4 MG TABLET    Take 1 tablet (4 mg total) by mouth 2 (two) times daily as needed.   TRIAMCINOLONE  OINTMENT (KENALOG ) 0.5 %    Apply 1 Application topically 2 (two) times daily.   VALACYCLOVIR  (VALTREX ) 500 MG  TABLET    Take 1 tablet (500 mg total) by mouth daily.  Modified Medications   No medications on file  Discontinued Medications   No medications on file    Labs: Lab Results  Component Value Date   HIV1RNAQUANT Not Detected 02/04/2023   HIV1RNAQUANT Not Detected 02/15/2022   HIV1RNAQUANT Not Detected 08/03/2021   CD4TABS 713 02/04/2023   CD4TABS 1,210 08/03/2021   CD4TABS 817 11/08/2020    RPR and STI Lab Results  Component Value Date   LABRPR NON-REACTIVE 02/04/2023   LABRPR Non Reactive 07/20/2022   LABRPR NON-REACTIVE 02/15/2022   LABRPR NON-REACTIVE 08/03/2021   LABRPR Non Reactive 02/03/2021    STI Results GC GC CT CT  02/04/2023  3:03 PM Negative   Positive    02/04/2023  3:01 PM Negative   Positive    07/20/2022  3:20 PM   Negative    03/05/2022  9:11 AM Negative   Negative    08/03/2021 10:08 AM  Negative    Negative    Negative   Negative    Negative    Negative    11/08/2020 10:36 AM Negative    Negative   Negative    Negative    04/18/2020  9:46 AM Negative   Negative    12/28/2019 11:30 AM Negative   Negative    12/31/2018 12:00 AM **POSITIVE**   Negative    12/19/2018 12:00 AM Negative   Negative    07/31/2018  1:48 PM   Negative    05/15/2018 12:00 AM Negative    Negative   Negative    Negative    04/11/2018 10:10 AM   Negative    11/08/2017  9:38 AM   Negative    08/06/2017  3:24 PM  NOT DETECTED   NOT DETECTED   07/02/2017 12:00 AM Negative   Negative    02/18/2017 12:00 AM Negative   Negative    07/05/2016 12:00 AM Negative    Negative   Negative    Negative    12/30/2015 11:19 AM  NOT DETECTED   NOT DETECTED   11/14/2015 12:00 AM Negative*    Negative*   Negative*    Negative*      Hepatitis B Lab Results  Component Value Date   HEPBSAB NONREACTIVE 03/19/2013   HEPBSAG NON-REACTIVE 02/04/2023   HEPBCAB NEG 03/19/2013   Hepatitis C No results found for: "HEPCAB", "HCVRNAPCRQN" Hepatitis A Lab Results  Component Value Date   HAV NEG 03/19/2013   Lipids: Lab Results  Component Value Date   CHOL 197 02/05/2024   TRIG 290 (H) 02/05/2024   HDL 51 02/05/2024   CHOLHDL 3.9 02/05/2024   VLDL 27 07/08/2017   LDLCALC 97 02/05/2024    Current HIV Regimen: Biktarvy , but has been out of medication  Assessment: Todd Carroll presents to clinic for HIV follow up today. He has missed the last 5 appointments at [facility]. Last seen by Dr. Levern Reader on 02/04/23 and HIV RNA was undetectable and CD4 count was 713. At that visit he tested positive for oral/rectal chlamydia and was treated with doxycycline . CMP, CBC, and lipid panel at PCP visit on 02/05/24 were stable and urine GC/CT/trich NAAT was negative. Epic fill history shows Biktarvy  has been filled once since December for a 30 day supply in March.   Today patient reports his barriers to care are transportation and  homelessness. He does have a cell phone that works. Endorses difficulty getting to the pharmacy to pick up medications.  When he filled Biktarvy  in March he notes taking 1 tablet every other day to stretch out the supply and now has been off treatment for about 3 weeks so will check HIV RNA with resistance testing. Also due for CD4 count recheck today. Patient reports he would be able to receive mail order medication at his address on file which would be easier than picking up at [facility] so we will send Biktarvy  to [facility] pharmacy for mail order today. Also set him up to talk with [facility] for housing and transportation resources after our visit today. Discussed that he is eligible for free transportation to doctor's appointments through [facility].  Eligible for Menveo booster, Prevnar 20, and Shingrix vaccines and he agreed to receive all today. Patient completed hepatitis B vaccine series in 2015. Will check Hepatitis B surface antibody to evaluate for immunity. Agrees to full STI testing as well.  Patient stated he would like to see an eye doctor and has concerns about an ingrown toenail. Encouraged him to follow up with PCP to request ophthalmology referral and toenail evaluation.  Plan: - HIV RNA with genotypic resistance testing, CD4 count, urine/rectal/pharyngeal cytologies, RPR, and Hepatitis B surface antibody today - Administered Menveo booster and Prevnar 20 in left deltoid and Shingrix vaccine dose 1/2 in right deltoid - Ordered Biktarvy  to [facility] for delivery - Follow up with Mylinda Asa on 05/20/24   Georga Killings, PharmD PGY-1 Pharmacy Resident

## 2024-04-16 ENCOUNTER — Ambulatory Visit: Admitting: Pharmacist

## 2024-04-16 ENCOUNTER — Other Ambulatory Visit: Payer: Self-pay

## 2024-04-16 ENCOUNTER — Other Ambulatory Visit: Payer: Self-pay | Admitting: Pharmacist

## 2024-04-16 ENCOUNTER — Other Ambulatory Visit (HOSPITAL_COMMUNITY): Payer: Self-pay

## 2024-04-16 DIAGNOSIS — Z113 Encounter for screening for infections with a predominantly sexual mode of transmission: Secondary | ICD-10-CM

## 2024-04-16 DIAGNOSIS — Z0000 Encounter for general adult medical examination without abnormal findings: Secondary | ICD-10-CM | POA: Diagnosis not present

## 2024-04-16 DIAGNOSIS — B20 Human immunodeficiency virus [HIV] disease: Secondary | ICD-10-CM | POA: Diagnosis not present

## 2024-04-16 DIAGNOSIS — Z23 Encounter for immunization: Secondary | ICD-10-CM

## 2024-04-16 MED ORDER — BIKTARVY 50-200-25 MG PO TABS
1.0000 | ORAL_TABLET | Freq: Every day | ORAL | 1 refills | Status: DC
Start: 2024-04-16 — End: 2024-05-26
  Filled 2024-04-16: qty 30, 30d supply, fill #0
  Filled 2024-05-12: qty 30, 30d supply, fill #1

## 2024-04-16 NOTE — Progress Notes (Signed)
 Specialty Pharmacy Initial Fill Coordination Note  Todd Carroll is a 48 y.o. male contacted today regarding initial fill of specialty medication(s) Bictegravir-Emtricitab-Tenofov (Biktarvy )   Patient requested [place] [facility] at [facility] at [place] date: 04/16/24   Medication will be filled on 04/16/24.   Patient is aware of $0 copayment.

## 2024-04-16 NOTE — Progress Notes (Signed)
 Specialty Pharmacy Initiation Note   Todd Carroll is a 48 y.o. male who will be followed by the specialty pharmacy service for RxSp HIV    Review of administration, indication, effectiveness, safety, potential side effects, storage/disposable, and missed dose instructions occurred today for patient's specialty medication(s) Bictegravir-Emtricitab-Tenofov (Biktarvy )     Patient/Caregiver did not have any additional questions or concerns.   Patient's therapy is appropriate to: Continue    Goals Addressed             This Visit's Progress    Achieve Undetectable HIV Viral Load < 20       Patient is unable to be assessed as therapy was recently initiated. Patient will work on increased adherence      Comply with lab assessments       Patient is on track. Patient will adhere to provider and/or lab appointments      Increase CD4 count until steady state       Patient is unable to be assessed as therapy was recently initiated. Patient will work on increased adherence         Sonya Duster Specialty Pharmacist

## 2024-04-17 ENCOUNTER — Other Ambulatory Visit (HOSPITAL_COMMUNITY): Payer: Self-pay

## 2024-04-17 LAB — GC/CHLAMYDIA PROBE, AMP (THROAT)
Chlamydia trachomatis RNA: NOT DETECTED
Neisseria gonorrhoeae RNA: NOT DETECTED

## 2024-04-17 LAB — CT/NG RNA, TMA RECTAL
Chlamydia Trachomatis RNA: NOT DETECTED
Neisseria Gonorrhoeae RNA: NOT DETECTED

## 2024-04-17 LAB — C. TRACHOMATIS/N. GONORRHOEAE RNA
C. trachomatis RNA, TMA: NOT DETECTED
N. gonorrhoeae RNA, TMA: NOT DETECTED

## 2024-04-20 ENCOUNTER — Other Ambulatory Visit: Payer: Self-pay | Admitting: Internal Medicine

## 2024-04-20 LAB — HIV RNA, RTPCR W/R GT (RTI, PI,INT)
HIV 1 RNA Quant: NOT DETECTED {copies}/mL
HIV-1 RNA Quant, Log: NOT DETECTED {Log_copies}/mL

## 2024-04-20 LAB — RPR: RPR Ser Ql: NONREACTIVE

## 2024-04-20 LAB — T-HELPER CELLS (CD4) COUNT (NOT AT ARMC)
Absolute CD4: 831 {cells}/uL (ref 490–1740)
CD4 T Helper %: 38 % (ref 30–61)
Total lymphocyte count: 2188 {cells}/uL (ref 850–3900)

## 2024-04-20 LAB — HEPATITIS B SURFACE ANTIBODY,QUALITATIVE: Hep B S Ab: NONREACTIVE

## 2024-04-20 NOTE — Telephone Encounter (Signed)
 Not prescribed by [facility].  Cledith Abdou, BSN, RN

## 2024-04-21 NOTE — Telephone Encounter (Unsigned)
 Copied from CRM [id number]. Topic: Clinical - Medication Refill >> Apr 21, 2024  1:19 PM [place] wrote: Medication: valACYclovir  (VALTREX ) 500 MG tablet  Has the patient contacted their pharmacy? Yes (Agent: If no, request that the patient contact the pharmacy for the refill. If patient does not wish to contact the pharmacy document the reason why and proceed with request.) (Agent: If yes, when and what did the pharmacy advise?)  This is the patient's preferred pharmacy:   [id number] - [place], [place] - [address] AT [facility] OF [address] & [address] [place] [address] Phone: [phone] Fax: [phone]  Is this the correct pharmacy for this prescription? Yes If no, delete pharmacy and type the correct one.   Has the prescription been filled recently? Yes  Is the patient out of the medication? Yes  Has the patient been seen for an appointment in the last year OR does the patient have an upcoming appointment? Yes  Can we respond through MyChart? Yes  Agent: Please be advised that Rx refills may take up to 3 business days. We ask that you follow-up with your pharmacy.

## 2024-04-22 NOTE — Progress Notes (Signed)
 The 10-year ASCVD risk score (Arnett DK, et al., 2019) is: 5%   Values used to calculate the score:     Age: 48 years     Sex: Male     Is Non-Hispanic African American: Yes     Diabetic: No     Tobacco smoker: No     Systolic Blood Pressure: 136 mmHg     Is BP treated: No     HDL Cholesterol: 51 mg/dL     Total Cholesterol: 197 mg/dL  Currently prescribed rosuvastatin  10 mg.  Khalib Fendley, BSN, RN

## 2024-05-07 ENCOUNTER — Ambulatory Visit: Payer: Self-pay | Admitting: Nurse Practitioner

## 2024-05-07 ENCOUNTER — Other Ambulatory Visit: Payer: Self-pay

## 2024-05-12 ENCOUNTER — Other Ambulatory Visit: Payer: Self-pay

## 2024-05-13 ENCOUNTER — Other Ambulatory Visit: Payer: Self-pay

## 2024-05-13 NOTE — Progress Notes (Signed)
 Specialty Pharmacy Refill Coordination Note  Todd Carroll is a 48 y.o. male contacted today regarding refills of specialty medication(s) Bictegravir-Emtricitab-Tenofov (Biktarvy )   Patient requested Delivery   Delivery date: 05/15/24   Verified address: [address]  [place] [address]   Medication will be filled on 05/14/24.

## 2024-05-18 NOTE — Progress Notes (Deleted)
 HPI: Todd Carroll is a 48 y.o. male who presents to the [facility] pharmacy clinic for HIV follow-up.  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 07/24/2019   Underinsured 07/24/2019   Dysphagia 10/23/2018   Constipation 10/23/2018   Hematospermia 11/01/2014   Scalp pruritus 07/25/2014   Itch 05/25/2014   Rash and nonspecific skin eruption 01/05/2014   Folliculitis 07/13/2013   Pain, dental 07/13/2013   Dysuria 04/29/2013   Human immunodeficiency virus (HIV) disease (HCC) 02/27/2013   GERD 05/04/2008   Allergic rhinitis 08/27/2007    Patient's Medications  New Prescriptions   No medications on file  Previous Medications   ALBUTEROL  (VENTOLIN  HFA) 108 (90 BASE) MCG/ACT INHALER    Inhale 1-2 puffs into the lungs every 6 (six) hours as needed for wheezing or shortness of breath.   BICTEGRAVIR-EMTRICITABINE -TENOFOVIR  AF (BIKTARVY ) 50-200-25 MG TABS TABLET    Take 1 tablet by mouth daily.   CLINDAMYCIN  (CLINDAGEL ) 1 % GEL    Apply topically 2 (two) times daily.   DENTIFRICES (LUMINEUX WHITENING TOOTHPASTE) PSTE    Place 1 Application onto teeth daily.   DICLOFENAC  SODIUM CR 100 MG 24 HR TABLET    Take 1 tablet (100 mg total) by mouth daily.   FEXOFENADINE  (ALLEGRA  ALLERGY) 180 MG TABLET    Take 1 tablet (180 mg total) by mouth daily.   HYDROXYZINE  (VISTARIL ) 50 MG CAPSULE    Take 1 capsule (50 mg total) by mouth 3 (three) times daily as needed.   LIDOCAINE  (LIDODERM ) 5 %    Place 1 patch onto the skin daily. Remove & Discard patch within 12 hours or as directed by MD   MELOXICAM  (MOBIC ) 7.5 MG TABLET    Take 1 tablet (7.5 mg total) by mouth daily as needed for pain.   MOXIFLOXACIN  (VIGAMOX ) 0.5 % OPHTHALMIC SOLUTION    Place 1 drop into both eyes 3 (three) times daily.   MULTIVITAMIN  (ONE-A-DAY MEN'S) TABS TABLET    Take 1 tablet by mouth daily.   OMEPRAZOLE  (PRILOSEC) 40 MG CAPSULE    TAKE 1 CAPSULE(40 MG) BY MOUTH DAILY   ROSUVASTATIN  (CRESTOR ) 10 MG TABLET    Take 1 tablet (10 mg total) by mouth daily.   SODIUM FLUORIDE  (PREVIDENT 5000 PLUS) 1.1 % CREA DENTAL CREAM    Use a pea sized amount of this toothpaste in the evening   TIZANIDINE  (ZANAFLEX ) 4 MG TABLET    Take 1 tablet (4 mg total) by mouth 2 (two) times daily as needed.   TRIAMCINOLONE  OINTMENT (KENALOG ) 0.5 %    Apply 1 Application topically 2 (two) times daily.   VALACYCLOVIR  (VALTREX ) 500 MG TABLET    Take 1 tablet (500 mg total) by mouth daily.  Modified Medications   No medications on file  Discontinued Medications   No medications on file    Labs: Lab Results  Component Value Date   HIV1RNAQUANT NOT DETECTED 04/16/2024   HIV1RNAQUANT Not Detected 02/04/2023   HIV1RNAQUANT Not Detected 02/15/2022   CD4TABS 713 02/04/2023   CD4TABS 1,210 08/03/2021   CD4TABS 817 11/08/2020    RPR and STI Lab Results  Component Value Date   LABRPR NON-REACTIVE 04/16/2024   LABRPR NON-REACTIVE 02/04/2023   LABRPR Non Reactive 07/20/2022   LABRPR NON-REACTIVE 02/15/2022   LABRPR NON-REACTIVE  08/03/2021    STI Results GC GC CT CT  02/04/2023  3:03 PM Negative   Positive    02/04/2023  3:01 PM Negative   Positive    07/20/2022  3:20 PM   Negative    03/05/2022  9:11 AM Negative   Negative    08/03/2021 10:08 AM Negative    Negative    Negative   Negative    Negative    Negative    11/08/2020 10:36 AM Negative    Negative   Negative    Negative    04/18/2020  9:46 AM Negative   Negative    12/28/2019 11:30 AM Negative   Negative    12/31/2018 12:00 AM **POSITIVE**   Negative    12/19/2018 12:00 AM Negative   Negative    07/31/2018  1:48 PM   Negative    05/15/2018 12:00 AM Negative    Negative   Negative    Negative    04/11/2018 10:10 AM   Negative    11/08/2017  9:38 AM   Negative    08/06/2017  3:24  PM  NOT DETECTED   NOT DETECTED   07/02/2017 12:00 AM Negative   Negative    02/18/2017 12:00 AM Negative   Negative    07/05/2016 12:00 AM Negative    Negative   Negative    Negative    12/30/2015 11:19 AM  NOT DETECTED   NOT DETECTED   11/14/2015 12:00 AM Negative*    Negative*   Negative*    Negative*      Hepatitis B Lab Results  Component Value Date   HEPBSAB NON-REACTIVE 04/16/2024   HEPBSAG NON-REACTIVE 02/04/2023   HEPBCAB NEG 03/19/2013   Hepatitis C No results found for: HEPCAB, HCVRNAPCRQN Hepatitis A Lab Results  Component Value Date   HAV NEG 03/19/2013   Lipids: Lab Results  Component Value Date   CHOL 197 02/05/2024   TRIG 290 (H) 02/05/2024   HDL 51 02/05/2024   CHOLHDL 3.9 02/05/2024   VLDL 27 07/08/2017   LDLCALC 97 02/05/2024    Current HIV Regimen: Biktarvy   Assessment: HIV RNA undetectable, CD4 count 831, and STI screening negative in May 2025.  Review of dispense history revealed patient was shipped Biktarvy  on 05/14/2024.   Patient is eligible for HBV vaccine today. He elects to *** this today. He is eligible for Shingrix  2/2 in July 2025.   Plan: ***  Tolu Evlyn Amason, PharmD [facility] PGY-1

## 2024-05-19 ENCOUNTER — Ambulatory Visit

## 2024-05-20 ENCOUNTER — Ambulatory Visit: Admitting: Pharmacist

## 2024-05-22 ENCOUNTER — Ambulatory Visit: Payer: Self-pay

## 2024-05-22 NOTE — Telephone Encounter (Signed)
 Copied from CRM [id number]. Topic: Clinical - Medical Advice >> May 22, 2024  3:11 PM Rosaria Common wrote: Reason for CRM: Pt calling due to frequent urination and burning.   Reason for Disposition  All other males with painful urination    Appointment made on 6/23 per patient's request  Answer Assessment - Initial Assessment Questions 1. SEVERITY: How bad is the pain?  (e.g., Scale 1-10; mild, moderate, or severe)   - MILD (1-3): Complains slightly about urination hurting.   - MODERATE (4-7): Interferes with normal activities.     - SEVERE (8-10): Excruciating, unwilling or unable to urinate because of the pain.      Mild to moderate  4. ONSET: When did the painful urination start?      1 week ago 5. FEVER: Do you have a fever? If Yes, ask: What is your temperature, how was it measured, and when did it start?     No 6. PAST UTI: Have you had a urine infection before? If Yes, ask: When was the last time? and What happened that time?      Yes, feels similar  7. CAUSE: What do you think is causing the painful urination?      UTI 8. OTHER SYMPTOMS: Do you have any other symptoms? (e.g., flank pain, penis discharge, scrotal pain, blood in urine)     Increased urinary frequency  Protocols used: Urination Pain - Male-A-AH   FYI Only or Action Required?: FYI only for provider.  Patient was last seen in primary care on 02/05/2024 by Jerrlyn Morel, NP. Called Nurse Triage reporting Dysuria. Symptoms began a week ago. Interventions attempted: Nothing. Symptoms are: unchanged.  Triage Disposition: See Physician Within 24 Hours (Patient scheduled on 6/23 per his request)  Patient/caregiver understands and will follow disposition?: Yes

## 2024-05-25 ENCOUNTER — Ambulatory Visit: Payer: Self-pay | Admitting: Nurse Practitioner

## 2024-05-25 ENCOUNTER — Ambulatory Visit: Payer: Self-pay

## 2024-05-25 NOTE — Telephone Encounter (Signed)
 FYI Only or Action Required?: FYI only for provider.  Patient was last seen in primary care on 02/05/2024 by Oley Bascom RAMAN, NP. Called Nurse Triage reporting Dysuria. Symptoms began a week ago. Interventions attempted: OTC medications: didn't state. Symptoms are: gradually worsening.  Triage Disposition: See Physician Within 24 Hours  Patient/caregiver understands and will follow disposition?: No, wishes to speak with PCP      Copied from CRM [id number]. Topic: Clinical - Red Word Triage >> May 25, 2024  3:40 PM Powell HERO wrote: Red Word that prompted transfer to Nurse Triage: frequent urination and burning seems to have gotten worse Reason for Disposition  All other males with painful urination  Answer Assessment - Initial Assessment Questions Pt missed his appt today and is requesting to be seen, called CAL no availability until Friday. Instructed UC pt refused and requested to be seen at clinic, scheduled for Friday.     1. SEVERITY: How bad is the pain?  (e.g., Scale 1-10; mild, moderate, or severe)   - MILD (1-3): Complains slightly about urination hurting.   - MODERATE (4-7): Interferes with normal activities.     - SEVERE (8-10): Excruciating, unwilling or unable to urinate because of the pain.      8/10   4. ONSET: When did the painful urination start?      A week ago or more 5. FEVER: Do you have a fever? If Yes, ask: What is your temperature, how was it measured, and when did it start?     no  7. CAUSE: What do you think is causing the painful urination?      uti 8. OTHER SYMPTOMS: Do you have any other symptoms? (e.g., flank pain, penis discharge, scrotal pain, blood in urine)     frequency  Protocols used: Urination Pain - Male-A-AH

## 2024-05-26 ENCOUNTER — Encounter: Payer: Self-pay | Admitting: Nurse Practitioner

## 2024-05-26 ENCOUNTER — Other Ambulatory Visit: Payer: Self-pay | Admitting: Pharmacist

## 2024-05-26 ENCOUNTER — Other Ambulatory Visit: Payer: Self-pay

## 2024-05-26 ENCOUNTER — Other Ambulatory Visit (HOSPITAL_COMMUNITY): Payer: Self-pay

## 2024-05-26 ENCOUNTER — Ambulatory Visit (INDEPENDENT_AMBULATORY_CARE_PROVIDER_SITE_OTHER): Admitting: Nurse Practitioner

## 2024-05-26 ENCOUNTER — Other Ambulatory Visit: Payer: Self-pay | Admitting: Nurse Practitioner

## 2024-05-26 VITALS — BP 110/70 | HR 79 | Temp 97.0°F | Wt 231.0 lb

## 2024-05-26 DIAGNOSIS — R300 Dysuria: Secondary | ICD-10-CM | POA: Diagnosis not present

## 2024-05-26 DIAGNOSIS — K219 Gastro-esophageal reflux disease without esophagitis: Secondary | ICD-10-CM

## 2024-05-26 DIAGNOSIS — K0889 Other specified disorders of teeth and supporting structures: Secondary | ICD-10-CM

## 2024-05-26 DIAGNOSIS — B20 Human immunodeficiency virus [HIV] disease: Secondary | ICD-10-CM

## 2024-05-26 DIAGNOSIS — L299 Pruritus, unspecified: Secondary | ICD-10-CM | POA: Diagnosis not present

## 2024-05-26 DIAGNOSIS — R369 Urethral discharge, unspecified: Secondary | ICD-10-CM | POA: Insufficient documentation

## 2024-05-26 LAB — POCT URINE DIPSTICK
Bilirubin, UA: NEGATIVE
Blood, UA: NEGATIVE
Glucose, UA: NEGATIVE mg/dL
Nitrite, UA: NEGATIVE
POC PROTEIN,UA: 100 — AB
Spec Grav, UA: >=1.03 — AB (ref 1.010–1.025)
Urobilinogen, UA: 0.2 U/dL
pH, UA: 6 (ref 5.0–8.0)

## 2024-05-26 MED ORDER — HYDROXYZINE PAMOATE 50 MG PO CAPS: 50.0000 mg | ORAL_CAPSULE | Freq: Three times a day (TID) | ORAL | 2 refills | Status: DC | PRN

## 2024-05-26 MED ORDER — BIKTARVY 50-200-25 MG PO TABS
1.0000 | ORAL_TABLET | Freq: Every day | ORAL | 0 refills | Status: DC
  Filled 2024-05-26 – 2024-06-03 (×2): qty 30, 30d supply, fill #0

## 2024-05-26 MED ORDER — OMEPRAZOLE 40 MG PO CPDR
40.0000 mg | DELAYED_RELEASE_CAPSULE | Freq: Every day | ORAL | 1 refills | Status: DC | PRN
  Filled 2024-05-26: qty 90, 90d supply, fill #0
  Filled 2024-09-07: qty 90, 90d supply, fill #1

## 2024-05-26 MED ORDER — LUMINEUX WHITENING TOOTHPASTE DT PSTE
1.0000 | PASTE | Freq: Every day | DENTAL | 2 refills | Status: DC
  Filled 2024-05-26 – 2024-11-19 (×3): qty 106, fill #0

## 2024-05-26 NOTE — Progress Notes (Signed)
 Patient reports that he can't find his Biktarvy .  I looked at [employer] tracking and it was delivered to his door, confirmed by him with visual description.  He says he can't find it.  I faxed for a refill and will see how soon insurance will let us  fill or if we can do a lost prescription override once the new rx is received.

## 2024-05-26 NOTE — Progress Notes (Signed)
 Acute Office Visit  Subjective:     Patient ID: Todd Carroll, male    DOB: 02/15/1976, 48 y.o.   MRN: [id number]  Chief Complaint  Patient presents with   Urinary Tract Infection     Burning and discharge     HPI Todd Carroll  has a past medical history of ALLERGIC RHINITIS (08/27/2007), Allergic rhinitis (08/27/2007), Allergy, GERD (05/04/2008), Herpes simplex infection of penis (05/02/2023), Human immunodeficiency virus (HIV) disease (HCC) (02/27/2013), and Spondylolysis of cervical spine (02/22/2022). Patient presents with complaints of dysuria, penile discharge for the past 1 to 2 weeks, he denies fever, genital rashes, abdominal pain, nausea, vomiting, hematuria.  Stated that he has been maintaining 1 sexual partner, does oral sex sometimes    Review of Systems  Constitutional:  Negative for appetite change, chills, fatigue and fever.  HENT:  Negative for congestion, postnasal drip, rhinorrhea and sneezing.   Respiratory:  Negative for cough, shortness of breath and wheezing.   Cardiovascular:  Negative for chest pain, palpitations and leg swelling.  Gastrointestinal:  Negative for abdominal pain, constipation, nausea and vomiting.  Genitourinary:  Positive for dysuria and penile discharge. Negative for difficulty urinating, flank pain and frequency.  Musculoskeletal:  Negative for arthralgias, back pain, joint swelling and myalgias.  Skin:  Negative for color change, pallor, rash and wound.  Neurological:  Negative for dizziness, facial asymmetry, weakness, numbness and headaches.  Psychiatric/Behavioral:  Negative for behavioral problems, confusion, self-injury and suicidal ideas.         Objective:    BP 110/70   Pulse 79   Temp (!) 97 F (36.1 C)   Wt 231 lb (104.8 kg)   SpO2 100%   BMI 30.48 kg/m    Physical Exam Vitals and nursing note reviewed.  Constitutional:      General: He is not in acute distress.    Appearance: Normal appearance. He is obese.  He is not ill-appearing, toxic-appearing or diaphoretic.   Eyes:     General: No scleral icterus.       Right eye: No discharge.        Left eye: No discharge.     Extraocular Movements: Extraocular movements intact.     Conjunctiva/sclera: Conjunctivae normal.    Cardiovascular:     Rate and Rhythm: Normal rate and regular rhythm.     Pulses: Normal pulses.     Heart sounds: Normal heart sounds. No murmur heard.    No friction rub. No gallop.  Pulmonary:     Effort: Pulmonary effort is normal. No respiratory distress.     Breath sounds: Normal breath sounds. No stridor. No wheezing, rhonchi or rales.  Chest:     Chest wall: No tenderness.  Abdominal:     General: There is no distension.     Palpations: Abdomen is soft.     Tenderness: There is no abdominal tenderness. There is no right CVA tenderness, left CVA tenderness or guarding.   Musculoskeletal:        General: No swelling, tenderness, deformity or signs of injury.     Right lower leg: No edema.     Left lower leg: No edema.   Skin:    General: Skin is warm and dry.     Capillary Refill: Capillary refill takes less than 2 seconds.     Coloration: Skin is not jaundiced or pale.     Findings: No bruising, erythema or lesion.   Neurological:     Mental Status:  He is alert and oriented to person, place, and time.     Motor: No weakness.     Gait: Gait normal.   Psychiatric:        Mood and Affect: Mood normal.        Behavior: Behavior normal.        Thought Content: Thought content normal.        Judgment: Judgment normal.     Results for orders placed or performed in visit on 05/26/24  POCT URINE DIPSTICK  Result Value Ref Range   Color, UA yellow yellow   Clarity, UA clear clear   Glucose, UA negative negative mg/dL   Bilirubin, UA negative negative   Ketones, POC UA small (15) (A) negative mg/dL   Spec Grav, UA >=8.969 (A) 1.010 - 1.025   Blood, UA negative negative   pH, UA 6.0 5.0 - 8.0   POC  PROTEIN,UA =100 (A) negative, trace   Urobilinogen, UA 0.2 0.2 or 1.0 E.U./dL   Nitrite, UA Negative Negative   Leukocytes, UA Trace (A) Negative        Assessment & Plan:   Problem List Items Addressed This Visit       Digestive   GERD   Needs medication refill, pantoprazole 40 mg daily as needed refill Advised to avoid fatty fried food, spicy foods and other foods that triggers acid reflux symptoms      Relevant Medications   omeprazole  (PRILOSEC) 40 MG capsule     Other   Dysuria - Primary   UA shows trace leukocytes negative nitrite Urine is sent for culture      Relevant Orders   POCT URINE DIPSTICK (Completed)   Chlamydia/Gonococcus/Trichomonas, NAA   Urine Culture   Pain, dental   Currently denies dental pain Needs refill for toothpaste - Dentifrices (LUMINEUX WHITENING TOOTHPASTE) PSTE; Place 1 Application onto teeth daily.  Dispense: 106 g; Refill: 2       Relevant Medications   Dentifrices (LUMINEUX WHITENING TOOTHPASTE) PSTE   Itch   Need medication refill Hydroxyzine  50 mg 3 times daily as needed refilled      Relevant Medications   hydrOXYzine  (VISTARIL ) 50 MG capsule   Penile discharge    - POCT URINE DIPSTICK - Chlamydia/Gonococcus/Trichomonas, NAA - Urine Culture        Meds ordered this encounter  Medications   omeprazole  (PRILOSEC) 40 MG capsule    Sig: Take 1 capsule (40 mg total) by mouth daily as needed.    Dispense:  90 capsule    Refill:  1    ZERO refills remain on this prescription. Your patient is requesting advance approval of refills for this medication to PREVENT ANY MISSED DOSES   Dentifrices (LUMINEUX WHITENING TOOTHPASTE) PSTE    Sig: Place 1 Application onto teeth daily.    Dispense:  106 g    Refill:  2   hydrOXYzine  (VISTARIL ) 50 MG capsule    Sig: Take 1 capsule (50 mg total) by mouth 3 (three) times daily as needed.    Dispense:  30 capsule    Refill:  2    No follow-ups on file.  Dannon Perlow R Hyder Deman,  FNP

## 2024-05-26 NOTE — Patient Instructions (Signed)

## 2024-05-26 NOTE — Assessment & Plan Note (Signed)
 Currently denies dental pain Needs refill for toothpaste - Dentifrices (LUMINEUX WHITENING TOOTHPASTE) PSTE; Place 1 Application onto teeth daily.  Dispense: 106 g; Refill: 2

## 2024-05-26 NOTE — Assessment & Plan Note (Addendum)
 Needs medication refill, pantoprazole 40 mg daily as needed refill Advised to avoid fatty fried food, spicy foods and other foods that triggers acid reflux symptoms

## 2024-05-26 NOTE — Assessment & Plan Note (Signed)
-   POCT URINE DIPSTICK - Chlamydia/Gonococcus/Trichomonas, NAA - Urine Culture

## 2024-05-26 NOTE — Assessment & Plan Note (Signed)
 Need medication refill Hydroxyzine  50 mg 3 times daily as needed refilled

## 2024-05-26 NOTE — Assessment & Plan Note (Signed)
 UA shows trace leukocytes negative nitrite Urine is sent for culture

## 2024-05-27 ENCOUNTER — Other Ambulatory Visit: Payer: Self-pay

## 2024-05-27 ENCOUNTER — Other Ambulatory Visit (HOSPITAL_COMMUNITY): Payer: Self-pay

## 2024-05-28 ENCOUNTER — Other Ambulatory Visit: Payer: Self-pay

## 2024-05-28 ENCOUNTER — Other Ambulatory Visit: Payer: Self-pay | Admitting: Nurse Practitioner

## 2024-05-28 ENCOUNTER — Ambulatory Visit: Payer: Self-pay

## 2024-05-28 ENCOUNTER — Other Ambulatory Visit (HOSPITAL_COMMUNITY): Payer: Self-pay

## 2024-05-28 LAB — URINE CULTURE: Organism ID, Bacteria: NO GROWTH

## 2024-05-28 LAB — CHLAMYDIA/GONOCOCCUS/TRICHOMONAS, NAA: Trich vag by NAA: NEGATIVE

## 2024-05-28 MED ORDER — ROSUVASTATIN CALCIUM 10 MG PO TABS
10.0000 mg | ORAL_TABLET | Freq: Every day | ORAL | 11 refills | Status: DC
  Filled 2024-05-28: qty 30, 30d supply, fill #0

## 2024-05-28 MED ORDER — ROSUVASTATIN CALCIUM 10 MG PO TABS
10.0000 mg | ORAL_TABLET | Freq: Every day | ORAL | 3 refills | Status: DC
  Filled 2024-05-28: qty 90, 90d supply, fill #0

## 2024-05-28 MED ORDER — MELOXICAM 7.5 MG PO TABS
7.5000 mg | ORAL_TABLET | Freq: Every day | ORAL | 0 refills | Status: DC | PRN
  Filled 2024-05-28: qty 30, 30d supply, fill #0

## 2024-05-28 NOTE — Telephone Encounter (Signed)
 FYI Only or Action Required?: Action required by provider: clinical question for provider and update on patient condition.  Patient was last seen in primary care on 05/26/2024 by [name], [name], FNP. Called Nurse Triage reporting Pain. Symptoms began several days ago. Interventions attempted: Nothing. Symptoms are: gradually worsening.  Triage Disposition: Go to ED or PCP/Alternative with Approval, Call PCP Within 24 Hours  Patient/caregiver understands and will follow disposition?: No, wishes to speak with PCP  Copied from CRM [id number]. Topic: Clinical - Red Word Triage >> May 28, 2024 11:33 AM Todd Carroll wrote: Red Word that prompted transfer to Nurse Triage: Major pain,waiting on urine culture. Reason for Disposition  Caller requesting lab results  (Exception: Routine or non-urgent lab result.)  Patient sounds very sick or weak to the triager    Extreme pain  Answer Assessment - Initial Assessment Questions 1. REASON FOR CALL or QUESTION: What is your reason for calling today? or How can I best help you? or What question do you have that I can help answer?     Lab results, pain in increasing  2. CALLER: Document the source of call. (e.g., laboratory, patient).     Patient  Answer Assessment - Initial Assessment Questions 1. SYMPTOM: What's the main symptom you're concerned about? (e.g., frequency, incontinence)     Urinary pain  2. ONSET: When did the  pain start?     Several days 3. PAIN: Is there any pain? If Yes, ask: How bad is it? (Scale: 1-10; mild, moderate, severe)     Severe. Pain significantly more intense than when evaluated.  4. CAUSE: What do you think is causing the symptoms?     Unsure-infection 5. OTHER SYMPTOMS: Do you have any other symptoms? (e.g., blood in urine, fever, flank pain, pain with urination)     Unsure-pt not interested in further triage   Additional info: Evaluated on 05/26/24 for urinary symptoms, test results are pending.  Patient states pain is worsening. He would like to speak with someone in the office directly regarding lab results and how to manage his pain. Attempted to reach off via CAL however calls went unanswered. Patient is very upset and requesting call back asap.  Protocols used: PCP Call - No Triage-A-AH, Urinary Symptoms-A-AH

## 2024-05-28 NOTE — Telephone Encounter (Signed)
 Please advise [facility]

## 2024-05-29 ENCOUNTER — Other Ambulatory Visit: Payer: Self-pay

## 2024-05-29 ENCOUNTER — Ambulatory Visit: Payer: Self-pay | Admitting: Nurse Practitioner

## 2024-05-29 DIAGNOSIS — A749 Chlamydial infection, unspecified: Secondary | ICD-10-CM

## 2024-05-29 LAB — CHLAMYDIA/GONOCOCCUS/TRICHOMONAS, NAA: Gonococcus by NAA: NEGATIVE

## 2024-05-29 MED ORDER — DOXYCYCLINE HYCLATE 100 MG PO TABS
100.0000 mg | ORAL_TABLET | Freq: Two times a day (BID) | ORAL | 0 refills | Status: AC
  Filled 2024-05-29: qty 14, 7d supply, fill #0

## 2024-06-02 ENCOUNTER — Other Ambulatory Visit: Payer: Self-pay

## 2024-06-02 ENCOUNTER — Ambulatory Visit: Payer: Self-pay

## 2024-06-02 NOTE — Telephone Encounter (Signed)
 Pt informed of Chlamydia infection and doxycycline . Pt would like staff to stop using mychart as he does not use mychart. Pt states I don't know why no one called me, I need to be called, every fucking time y'all don't call me. Pt disconnected call.   Copied from CRM [id number]. Topic: Clinical - Medication Question >> Jun 02, 2024  4:37 PM Tonda B wrote: Reason for CRM: patient has medical question

## 2024-06-03 ENCOUNTER — Other Ambulatory Visit: Payer: Self-pay

## 2024-06-03 ENCOUNTER — Other Ambulatory Visit (HOSPITAL_COMMUNITY): Payer: Self-pay

## 2024-06-03 NOTE — Progress Notes (Signed)
 Biktarvy  was still at [facility] outpatient for pick up. Patient requested medication to be delivered to: [address] [place] [address]   Medication is being filled 06/03/24  Delivery date: 06/04/24

## 2024-06-04 ENCOUNTER — Other Ambulatory Visit: Payer: Self-pay

## 2024-06-08 ENCOUNTER — Other Ambulatory Visit: Payer: Self-pay

## 2024-06-08 ENCOUNTER — Other Ambulatory Visit (HOSPITAL_COMMUNITY): Payer: Self-pay

## 2024-06-09 ENCOUNTER — Other Ambulatory Visit: Payer: Self-pay

## 2024-06-10 ENCOUNTER — Other Ambulatory Visit: Payer: Self-pay

## 2024-06-11 ENCOUNTER — Other Ambulatory Visit: Payer: Self-pay

## 2024-06-11 ENCOUNTER — Other Ambulatory Visit: Payer: Self-pay | Admitting: Nurse Practitioner

## 2024-06-11 ENCOUNTER — Telehealth: Payer: Self-pay | Admitting: Nurse Practitioner

## 2024-06-11 ENCOUNTER — Ambulatory Visit: Payer: Self-pay | Admitting: *Deleted

## 2024-06-11 DIAGNOSIS — A749 Chlamydial infection, unspecified: Secondary | ICD-10-CM

## 2024-06-11 DIAGNOSIS — L299 Pruritus, unspecified: Secondary | ICD-10-CM

## 2024-06-11 MED ORDER — LEVOFLOXACIN 500 MG PO TABS
500.0000 mg | ORAL_TABLET | Freq: Every day | ORAL | 0 refills | Status: DC
  Filled 2024-06-11: qty 7, 7d supply, fill #0

## 2024-06-11 MED ORDER — LEVOFLOXACIN 500 MG PO TABS: 500.0000 mg | ORAL_TABLET | Freq: Every day | ORAL | 0 refills | Status: DC

## 2024-06-11 MED ORDER — HYDROXYZINE PAMOATE 50 MG PO CAPS
50.0000 mg | ORAL_CAPSULE | Freq: Three times a day (TID) | ORAL | 0 refills | Status: DC | PRN
Start: 2024-06-11 — End: 2024-08-13

## 2024-06-11 MED ORDER — LEVOFLOXACIN 500 MG PO TABS
500.0000 mg | ORAL_TABLET | Freq: Every day | ORAL | 0 refills | Status: AC
  Filled 2024-06-11: qty 7, 7d supply, fill #0

## 2024-06-11 MED ORDER — HYDROXYZINE PAMOATE 50 MG PO CAPS
50.0000 mg | ORAL_CAPSULE | Freq: Three times a day (TID) | ORAL | 0 refills | Status: DC | PRN
  Filled 2024-06-11 – 2024-08-25 (×2): qty 270, 90d supply, fill #0
  Filled 2024-09-10 (×2): qty 90, 30d supply, fill #0
  Filled 2024-10-01 – 2024-10-07 (×2): qty 90, 30d supply, fill #1
  Filled 2024-10-30 – 2024-11-02 (×2): qty 90, 30d supply, fill #2

## 2024-06-11 NOTE — Telephone Encounter (Signed)
 Patient stated he needs speak with you about his meds states it's not working.

## 2024-06-11 NOTE — Telephone Encounter (Signed)
 Copied from CRM [id number]. Topic: Clinical - Red Word Triage >> Jun 11, 2024  7:44 AM Donna BRAVO wrote: Red Word that prompted transfer to Nurse Triage: patient requesting to speak to Nurse triage, urine problem Reason for Disposition  Urinating more frequently than usual (i.e., frequency) OR new-onset of the feeling of an urgent need to urinate (i.e., urgency)    Pt seen 6/27.  Having same issues.   Very upset and demanding his medication be stronger.  Refused triage.  Answer Assessment - Initial Assessment Questions 1. SYMPTOM: What's the main symptom you're concerned about? (e.g., frequency, incontinence)     I'm having the same issues as I had before.   I'm not getting better.   I need another medicine.   Pt said I knew this medicine would not work.  It's not strong enough   Pt was upset and demanding.   Refused to be triaged but he has been seen for a UTI so I did not press the issue.   I do not use MyChart.   Pt very upset and repeatedly telling me I don't use MyChart.   Stop asking me about it.   You have not said anything about it but I'm sure you will if I don't tell you up front.   Call me back on my phone.     2. ONSET: When did the  pt refused to answer  start?     I'm not going to answer all those questions again.   I don't need to.   I need this medicine to be stronger. 3. PAIN: Is there any pain? If Yes, ask: How bad is it? (Scale: 1-10; mild, moderate, severe)     See notes from OV for symptoms as pt refused to be triaged. 4. CAUSE: What do you think is causing the symptoms?      5. OTHER SYMPTOMS: Do you have any other symptoms? (e.g., blood in urine, fever, flank pain, pain with urination)     Same symptoms.  No better than when I was seen. 6. PREGNANCY: Is there any chance you are pregnant? When was your last menstrual period?     N/A  Protocols used: Urinary Symptoms-A-AH FYI Only or Action Required?: Action required by provider: clinical question for  provider.  Patient was last seen in primary care on 05/26/2024 by [name], [name], FNP.  Called Nurse Triage reporting Dysuria.  Symptoms began several weeks ago.  Interventions attempted: Prescription medications: Rx not working.  Symptoms are: unchanged.  Triage Disposition: See Physician Within 24 Hours  Patient/caregiver understands and will follow disposition?:   No.   Pt upset and refused to answer triage questions.  I'm still having the same issues.   This medicine is not strong enough.   I don't need to talk with the call center.   I need to talk with my doctor.  [facility].   Pt adamantly said to call him.   He does not use MyChart.  Very upset that he is no better.

## 2024-06-11 NOTE — Telephone Encounter (Signed)
 Pt was taken care of. New script was sent in. [facility]

## 2024-06-11 NOTE — Telephone Encounter (Signed)
 FYI Only or Action Required?: Action required by provider: update on patient condition and demands follow up call from pcp.  Patient was last seen in primary care on 05/26/2024 by [name], [name], FNP.  Called Nurse Triage reporting Dysuria, Complaint of care, and New Med Request.  Symptoms began several weeks ago.  Interventions attempted: Prescription medications: finished doxy.  Symptoms are: unchanged.  Triage Disposition: Call PCP Now, See Physician Within 24 Hours  Patient/caregiver understands and will follow disposition?: No, wishes to speak with PCP     Reason for Disposition  [1] Caller demands to speak with the PCP AND [2] about sick adult (or sick caller)  Answer Assessment - Initial Assessment Questions 1. SITUATION:  Document reason for call.     Patient called for new antibiotic 2. BACKGROUND: Document any background information (e.g., prior calls, known psychiatric history)     05/26/24 rx doxy. Called several times since 730 this morning 3. ASSESSMENT: Document your nursing assessment.     Unable to fully assess. Patient states no symptoms improvement, unable to discern if symptoms have progressed. Patient hyperverbal, stating staff is stupid, yelling on the call. Patient states doxy 100mg  is too low, the doctor prescribed the wrong dose, it should be 500, who ever heard of antibiotic so low at 100 attempts to educate patient not accepted.  4. RESPONSE: Document what your response or recommendation was.     Attempted to empathize with patient over frustration, offered to assist, offered follow up visit, this is when patient began yelling, hung up call when this writer stated a message will be sent to pcp for follow up.  Protocols used: Difficult Call-A-AH

## 2024-06-12 ENCOUNTER — Other Ambulatory Visit: Payer: Self-pay

## 2024-06-12 ENCOUNTER — Other Ambulatory Visit (HOSPITAL_COMMUNITY): Payer: Self-pay

## 2024-06-12 NOTE — Telephone Encounter (Signed)
 New med sent in yesterday. KH

## 2024-06-22 ENCOUNTER — Other Ambulatory Visit: Payer: Self-pay

## 2024-06-22 ENCOUNTER — Other Ambulatory Visit (HOSPITAL_COMMUNITY): Payer: Self-pay

## 2024-06-22 ENCOUNTER — Other Ambulatory Visit: Payer: Self-pay | Admitting: Pharmacist

## 2024-06-22 DIAGNOSIS — B20 Human immunodeficiency virus [HIV] disease: Secondary | ICD-10-CM

## 2024-06-22 MED ORDER — BIKTARVY 50-200-25 MG PO TABS
1.0000 | ORAL_TABLET | Freq: Every day | ORAL | 0 refills | Status: DC
  Filled 2024-06-22 – 2024-07-15 (×3): qty 30, 30d supply, fill #0

## 2024-06-25 ENCOUNTER — Ambulatory Visit: Payer: Self-pay | Admitting: Nurse Practitioner

## 2024-06-26 ENCOUNTER — Other Ambulatory Visit (HOSPITAL_COMMUNITY): Payer: Self-pay

## 2024-06-26 ENCOUNTER — Other Ambulatory Visit: Payer: Self-pay

## 2024-06-30 ENCOUNTER — Other Ambulatory Visit: Payer: Self-pay

## 2024-06-30 ENCOUNTER — Other Ambulatory Visit (HOSPITAL_COMMUNITY): Payer: Self-pay

## 2024-07-02 ENCOUNTER — Other Ambulatory Visit: Payer: Self-pay

## 2024-07-06 ENCOUNTER — Other Ambulatory Visit (HOSPITAL_COMMUNITY): Payer: Self-pay

## 2024-07-15 ENCOUNTER — Other Ambulatory Visit: Payer: Self-pay

## 2024-07-15 NOTE — Progress Notes (Signed)
 Specialty Pharmacy Refill Coordination Note  Todd Carroll is a 48 y.o. male contacted today regarding refills of specialty medication(s) Bictegravir-Emtricitab-Tenofov (Biktarvy )   Patient requested Delivery   Delivery date: 07/16/24   Verified address: [address]  [name] [address]   Medication will be filled on 08.13.25.

## 2024-08-07 ENCOUNTER — Other Ambulatory Visit: Payer: Self-pay

## 2024-08-07 ENCOUNTER — Other Ambulatory Visit: Payer: Self-pay | Admitting: Internal Medicine

## 2024-08-07 DIAGNOSIS — B20 Human immunodeficiency virus [HIV] disease: Secondary | ICD-10-CM

## 2024-08-07 NOTE — Progress Notes (Signed)
 Specialty Pharmacy Refill Coordination Note  Todd Carroll is a 48 y.o. male contacted today regarding refills of specialty medication(s) Bictegravir-Emtricitab-Tenofov (Biktarvy )   Patient requested Delivery   Delivery date: 08/12/24   Verified address: [address]  [name] [address]   Medication will be filled on 08/11/24, pending refill approval.

## 2024-08-07 NOTE — Progress Notes (Signed)
 Patient called back stating he only had 4 or 5 tablets left and requested refill be delivered sooner as he cannot find the refill that he received on 8/18. Patient aware that we had to fax for refills, and that he may need an appointment. Updated ship date to 9/8 with delivery on 9/9 if refill is sent back in time.

## 2024-08-10 ENCOUNTER — Telehealth: Payer: Self-pay

## 2024-08-10 ENCOUNTER — Other Ambulatory Visit: Payer: Self-pay

## 2024-08-10 ENCOUNTER — Other Ambulatory Visit (HOSPITAL_COMMUNITY): Payer: Self-pay

## 2024-08-10 MED ORDER — BIKTARVY 50-200-25 MG PO TABS
1.0000 | ORAL_TABLET | Freq: Every day | ORAL | 0 refills | Status: DC
  Filled 2024-08-10: qty 30, 30d supply, fill #0

## 2024-08-10 NOTE — Telephone Encounter (Signed)
 Pt due for an appointment with Dr. Luiz. I attempted to make contact but call could not be completed and no MyChart activity.

## 2024-08-11 ENCOUNTER — Other Ambulatory Visit: Payer: Self-pay

## 2024-08-11 ENCOUNTER — Other Ambulatory Visit (HOSPITAL_COMMUNITY): Payer: Self-pay

## 2024-08-13 ENCOUNTER — Other Ambulatory Visit: Payer: Self-pay | Admitting: Nurse Practitioner

## 2024-08-13 DIAGNOSIS — L299 Pruritus, unspecified: Secondary | ICD-10-CM

## 2024-08-13 NOTE — Telephone Encounter (Signed)
 Please advise [facility]

## 2024-08-25 ENCOUNTER — Other Ambulatory Visit: Payer: Self-pay

## 2024-09-03 ENCOUNTER — Other Ambulatory Visit: Payer: Self-pay

## 2024-09-03 ENCOUNTER — Other Ambulatory Visit: Payer: Self-pay | Admitting: Internal Medicine

## 2024-09-03 ENCOUNTER — Other Ambulatory Visit (HOSPITAL_COMMUNITY): Payer: Self-pay

## 2024-09-03 DIAGNOSIS — B20 Human immunodeficiency virus [HIV] disease: Secondary | ICD-10-CM

## 2024-09-03 MED ORDER — BIKTARVY 50-200-25 MG PO TABS
1.0000 | ORAL_TABLET | Freq: Every day | ORAL | 0 refills | Status: DC
  Filled 2024-09-03: qty 30, 30d supply, fill #0

## 2024-09-07 ENCOUNTER — Other Ambulatory Visit (HOSPITAL_COMMUNITY): Payer: Self-pay

## 2024-09-07 ENCOUNTER — Other Ambulatory Visit: Payer: Self-pay

## 2024-09-07 ENCOUNTER — Other Ambulatory Visit: Payer: Self-pay | Admitting: Pharmacy Technician

## 2024-09-07 NOTE — Progress Notes (Signed)
 Specialty Pharmacy Refill Coordination Note  Todd Carroll is a 48 y.o. male contacted today regarding refills of specialty medication(s) Bictegravir-Emtricitab-Tenofov (Biktarvy )   Patient requested Delivery   Delivery date: 09/09/24   Verified address: [address] [place]   Medication will be filled on 09/08/24.

## 2024-09-09 ENCOUNTER — Other Ambulatory Visit: Payer: Self-pay

## 2024-09-10 ENCOUNTER — Ambulatory Visit: Payer: Self-pay | Admitting: *Deleted

## 2024-09-10 ENCOUNTER — Other Ambulatory Visit (HOSPITAL_BASED_OUTPATIENT_CLINIC_OR_DEPARTMENT_OTHER): Payer: Self-pay

## 2024-09-10 ENCOUNTER — Other Ambulatory Visit: Payer: Self-pay | Admitting: Nurse Practitioner

## 2024-09-10 ENCOUNTER — Other Ambulatory Visit (HOSPITAL_COMMUNITY): Payer: Self-pay

## 2024-09-10 ENCOUNTER — Other Ambulatory Visit: Payer: Self-pay

## 2024-09-10 DIAGNOSIS — L299 Pruritus, unspecified: Secondary | ICD-10-CM

## 2024-09-10 MED ORDER — TRIAMCINOLONE ACETONIDE 0.025 % EX OINT
TOPICAL_OINTMENT | Freq: Two times a day (BID) | CUTANEOUS | 0 refills | Status: DC
  Filled 2024-09-10: qty 30, 15d supply, fill #0

## 2024-09-10 NOTE — Telephone Encounter (Signed)
 FYI Only or Action Required?: Action required by provider: update on patient condition and requesting medication.  Patient was last seen in primary care on 05/26/2024 by Todd Carroll, Todd R, FNP.  Called Nurse Triage reporting Dysuria.  Symptoms began never went away since July .  Interventions attempted: Other: see chart .  Symptoms are: unchanged.  Triage Disposition: See HCP Within 4 Hours (Or PCP Triage)  Patient/caregiver understands and will follow disposition?: No, wishes to speak with PCP  Patient did not want to review sx and wants to get call back from [place] [facility] [place] that is aware of his needs . Please advise. Patient also reports he is having issues with transportation for an appt tomorrow.                Copied from CRM [id number]. Topic: Clinical - Red Word Triage >> Sep 10, 2024  3:02 PM Ivette P wrote: [facility] that prompted transfer to Nurse Triage: regarding his health. Reason for Disposition  Diabetes mellitus or weak immune system (e.g., HIV positive, cancer chemo, splenectomy, organ transplant, chronic steroids)  Answer Assessment - Initial Assessment Questions Please advise. Patient requesting to have [name], RMA call him back to discuss sx. Patient would only answer limited questions for this RN. Reports sx same as last time itching, burning with urination. Reports on going sx and never went away' since 06/11/24. Reports he has not been sexually active. Patient reports Infectious disease appt was changed to earlier appt and now he can not get transportation to accommodate him for tomorrow. Reviewed with patient this RN can not cancel appt . Please advise.  CAL notified patient did not want to schedule appt just wanted medication for continued sx.        1. SEVERITY: How bad is the pain?  (e.g., Scale 1-10; mild, moderate, or severe)     Did not report pain level 2. FREQUENCY: How many times have you had painful urination today?      Na  3.  PATTERN: Is pain present every time you urinate or just sometimes?      Na  4. ONSET: When did the painful urination start?      On going since 06/11/24  5. FEVER: Do you have a fever? If Yes, ask: What is your temperature, how was it measured, and when did it start?     Na  6. PAST UTI: Have you had a urine infection before? If Yes, ask: When was the last time? and What happened that time?      Last dx chlamydia  7. CAUSE: What do you think is causing the painful urination?      Patient reports same issues as before burning with urination, itching.  8. OTHER SYMPTOMS: Do you have any other symptoms? (e.g., flank pain, penis discharge, scrotal pain, blood in urine)     See above. Patient did not want to answer many questions from NT.  Protocols used: Urination Pain - Male-A-AH

## 2024-09-11 ENCOUNTER — Telehealth (HOSPITAL_COMMUNITY): Payer: Self-pay | Admitting: Pharmacy Technician

## 2024-09-11 ENCOUNTER — Other Ambulatory Visit (HOSPITAL_COMMUNITY)
Admission: RE | Admit: 2024-09-11 | Discharge: 2024-09-11 | Disposition: A | Discharge status: Home / Self Care | Source: Ambulatory Visit | Attending: Internal Medicine | Admitting: Internal Medicine

## 2024-09-11 ENCOUNTER — Other Ambulatory Visit: Payer: Self-pay

## 2024-09-11 ENCOUNTER — Encounter: Payer: Self-pay | Admitting: Internal Medicine

## 2024-09-11 ENCOUNTER — Other Ambulatory Visit (HOSPITAL_COMMUNITY): Payer: Self-pay

## 2024-09-11 ENCOUNTER — Ambulatory Visit (INDEPENDENT_AMBULATORY_CARE_PROVIDER_SITE_OTHER): Admitting: Internal Medicine

## 2024-09-11 ENCOUNTER — Other Ambulatory Visit (HOSPITAL_BASED_OUTPATIENT_CLINIC_OR_DEPARTMENT_OTHER): Payer: Self-pay

## 2024-09-11 VITALS — BP 106/63 | HR 94 | Temp 100.2°F

## 2024-09-11 DIAGNOSIS — B20 Human immunodeficiency virus [HIV] disease: Secondary | ICD-10-CM | POA: Insufficient documentation

## 2024-09-11 DIAGNOSIS — L600 Ingrowing nail: Secondary | ICD-10-CM

## 2024-09-11 DIAGNOSIS — A6001 Herpesviral infection of penis: Secondary | ICD-10-CM

## 2024-09-11 DIAGNOSIS — Z23 Encounter for immunization: Secondary | ICD-10-CM | POA: Diagnosis not present

## 2024-09-11 DIAGNOSIS — Z79899 Other long term (current) drug therapy: Secondary | ICD-10-CM | POA: Diagnosis not present

## 2024-09-11 DIAGNOSIS — L299 Pruritus, unspecified: Secondary | ICD-10-CM

## 2024-09-11 DIAGNOSIS — R300 Dysuria: Secondary | ICD-10-CM | POA: Diagnosis not present

## 2024-09-11 DIAGNOSIS — K219 Gastro-esophageal reflux disease without esophagitis: Secondary | ICD-10-CM

## 2024-09-11 MED ORDER — CLINDAMYCIN PHOS (ONCE-DAILY) 1 % EX GEL
1.0000 | CUTANEOUS | 1 refills | Status: DC | PRN
  Filled 2024-09-11: qty 75, 30d supply, fill #0

## 2024-09-11 MED ORDER — VALACYCLOVIR HCL 500 MG PO TABS
500.0000 mg | ORAL_TABLET | Freq: Every day | ORAL | 11 refills | Status: DC
Start: 2024-09-11 — End: 2024-10-07
  Filled 2024-09-11 – 2024-10-05 (×3): qty 30, 30d supply, fill #0

## 2024-09-11 MED ORDER — SODIUM FLUORIDE 1.1 % DT CREA
TOPICAL_CREAM | DENTAL | 6 refills | Status: AC
  Filled 2024-09-11: qty 51, 30d supply, fill #0
  Filled 2024-10-01: qty 51, 30d supply, fill #1
  Filled 2024-11-02 – 2024-11-27 (×7): qty 51, 30d supply, fill #2
  Filled 2024-12-17 – 2024-12-20 (×2): qty 51, 30d supply, fill #3
  Filled 2024-12-30: qty 51, 30d supply, fill #4

## 2024-09-11 MED ORDER — TRIAMCINOLONE ACETONIDE 0.025 % EX OINT
TOPICAL_OINTMENT | Freq: Two times a day (BID) | CUTANEOUS | 0 refills | Status: AC
  Filled 2024-10-05: qty 30, 15d supply, fill #0

## 2024-09-11 MED ORDER — DICLOFENAC SODIUM 1 % EX GEL
2.0000 g | Freq: Four times a day (QID) | CUTANEOUS | 1 refills | Status: DC
  Filled 2024-09-11: qty 200, 25d supply, fill #0

## 2024-09-11 MED ORDER — BIKTARVY 50-200-25 MG PO TABS
1.0000 | ORAL_TABLET | Freq: Every day | ORAL | 11 refills | Status: DC
  Filled 2024-09-11 – 2024-10-01 (×2): qty 30, 30d supply, fill #0
  Filled 2024-10-30 – 2024-11-02 (×3): qty 30, 30d supply, fill #1
  Filled 2024-11-25 – 2024-11-27 (×2): qty 30, 30d supply, fill #2
  Filled 2024-12-24: qty 30, 30d supply, fill #3

## 2024-09-11 MED ORDER — ROSUVASTATIN CALCIUM 10 MG PO TABS
10.0000 mg | ORAL_TABLET | Freq: Every day | ORAL | 3 refills | Status: DC
  Filled 2024-09-11: qty 90, 90d supply, fill #0

## 2024-09-11 MED ORDER — OMEPRAZOLE 40 MG PO CPDR
40.0000 mg | DELAYED_RELEASE_CAPSULE | Freq: Every day | ORAL | 1 refills | Status: DC | PRN
  Filled 2024-09-11 – 2024-09-14 (×2): qty 90, 90d supply, fill #0

## 2024-09-11 NOTE — Telephone Encounter (Signed)
 PA request has been Received. New Encounter has been or will be created for follow up. For additional info see Pharmacy Prior Auth telephone encounter from 09/11/24.

## 2024-09-11 NOTE — Progress Notes (Signed)
 RFV: follow up for hiv disease  Patient ID: Todd Carroll, male   DOB: [birth year], 48 y.o.   MRN: [id number]  HPI  [place] Is a 48yo M with well controlled hiv disease, CD 4 count of 831/VL<20 (may 2025) hx of chlamydia in August.  Discomfort after urinating or masturbating No sex since JuneAnd finished course of doxy in at end of June Also reports that he has noticed some discomfort to his Left toe nail? Ingrown; no swelling, erythema or drainage. Outpatient Encounter Medications as of 09/11/2024  Medication Sig   albuterol  (VENTOLIN  HFA) 108 (90 Base) MCG/ACT inhaler Inhale 1-2 puffs into the lungs every 6 (six) hours as needed for wheezing or shortness of breath.   bictegravir-emtricitabine -tenofovir  AF (BIKTARVY ) 50-200-25 MG TABS tablet Take 1 tablet by mouth daily. CALL [phone] FOR AN APPOINTMENT   clindamycin  (CLINDAGEL ) 1 % gel Apply topically 2 (two) times daily.   Dentifrices (LUMINEUX WHITENING TOOTHPASTE) PSTE Place 1 Application onto teeth daily.   Diclofenac  Sodium CR 100 MG 24 hr tablet Take 1 tablet (100 mg total) by mouth daily. (Patient not taking: Reported on 05/26/2024)   fexofenadine  (ALLEGRA  ALLERGY) 180 MG tablet Take 1 tablet (180 mg total) by mouth daily.   hydrOXYzine  (VISTARIL ) 50 MG capsule Take 1 capsule (50 mg total) by mouth 3 (three) times daily as needed.   hydrOXYzine  (VISTARIL ) 50 MG capsule TAKE 1 CAPSULE(50 MG) BY MOUTH THREE TIMES DAILY AS NEEDED   lidocaine  (LIDODERM ) 5 % Place 1 patch onto the skin daily. Remove & Discard patch within 12 hours or as directed by MD   meloxicam  (MOBIC ) 7.5 MG tablet Take 1 tablet (7.5 mg total) by mouth daily as needed for pain.   moxifloxacin  (VIGAMOX ) 0.5 % ophthalmic solution Place 1 drop into both eyes 3 (three) times daily.   multivitamin (ONE-A-DAY MEN'S) TABS tablet Take 1 tablet by mouth daily.   omeprazole  (PRILOSEC) 40 MG capsule Take 1 capsule (40 mg total) by mouth daily as needed.    rosuvastatin  (CRESTOR ) 10 MG tablet Take 1 tablet (10 mg total) by mouth daily.   sodium fluoride  (PREVIDENT 5000 PLUS) 1.1 % CREA dental cream Use a pea sized amount of this toothpaste in the evening   tiZANidine  (ZANAFLEX ) 4 MG tablet Take 1 tablet (4 mg total) by mouth 2 (two) times daily as needed.   triamcinolone  (KENALOG ) 0.025 % ointment Apply topically 2 (two) times daily.   triamcinolone  ointment (KENALOG ) 0.5 % Apply 1 Application topically 2 (two) times daily.   valACYclovir  (VALTREX ) 500 MG tablet Take 1 tablet (500 mg total) by mouth daily.   No facility-administered encounter medications on file as of 09/11/2024.     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   Diarrhea 08/10/2021   Streptococcus A carrier or suspected carrier 07/24/2019   Underinsured 07/24/2019   Dysphagia 10/23/2018   Constipation 10/23/2018   Hematospermia 11/01/2014   Scalp pruritus 07/25/2014   Itch 05/25/2014   Rash and nonspecific skin eruption 01/05/2014   Folliculitis 07/13/2013   Pain, dental 07/13/2013   Dysuria 04/29/2013   Human immunodeficiency virus (HIV) disease (HCC) 02/27/2013   GERD 05/04/2008   Allergic rhinitis 08/27/2007     Health Maintenance Due  Topic Date Due   HPV VACCINES (3 - Risk male 3-dose series) 11/14/2018   Fecal  DNA (Cologuard)  Never done   Influenza Vaccine  07/03/2024   COVID-19 Vaccine (4 - 2025-26 season) 08/03/2024     Review of Systems 12 point ros is negative except what is mentioned in hpi Physical Exam  BP 106/63   Pulse 94   Temp 100.2 F (37.9 C) (Oral)   SpO2 98%  Physical Exam  Constitutional: He is oriented to person, place, and time. He appears well-developed and well-nourished. No distress.  HENT:  Mouth/Throat: Oropharynx is clear and moist. No oropharyngeal exudate.   Cardiovascular: Normal rate, regular rhythm and normal heart sounds. Exam reveals no gallop and no friction rub.  No murmur heard.  Pulmonary/Chest: Effort normal and breath sounds normal. No respiratory distress. He has no wheezes.  Abdominal: Soft. Bowel sounds are normal. He exhibits no distension. There is no tenderness.  Lymphadenopathy:  He has no cervical adenopathy.  Neurological: He is alert and oriented to person, place, and time.  Skin: Skin is warm and dry. No rash noted. No erythema.  Psychiatric: He has a normal mood and affect. His behavior is normal.   Lab Results  Component Value Date   CD4TCELL 38 04/16/2024   Lab Results  Component Value Date   CD4TABS 713 02/04/2023   CD4TABS 1,210 08/03/2021   CD4TABS 817 11/08/2020   Lab Results  Component Value Date   HIV1RNAQUANT NOT DETECTED 04/16/2024   Lab Results  Component Value Date   HEPBSAB NON-REACTIVE 04/16/2024   Lab Results  Component Value Date   LABRPR NON-REACTIVE 04/16/2024    CBC Lab Results  Component Value Date   WBC 9.2 02/05/2024   RBC 4.15 02/05/2024   HGB 12.6 (L) 02/05/2024   HCT 38.2 02/05/2024   PLT 425 02/05/2024   MCV 92 02/05/2024   MCH 30.4 02/05/2024   MCHC 33.0 02/05/2024   RDW 13.6 02/05/2024   LYMPHSABS 2,373 02/04/2023   MONOABS 553 07/02/2017   EOSABS 252 02/04/2023    BMET Lab Results  Component Value Date   NA 140 02/05/2024   K 4.3 02/05/2024   CL 102 02/05/2024   CO2 24 02/05/2024   GLUCOSE 89 02/05/2024   BUN 17 02/05/2024   CREATININE 1.27 02/05/2024   CALCIUM  9.4 02/05/2024   GFRNONAA 85 11/08/2020   GFRAA 98 11/08/2020      Assessment and Plan  HIV disease= plan to check cd 4 count and HIV VL; will provider refills of biktarvy   Long term medication management = will chec cr and cbc to see stable on current regimen  Dysuria= plan to check ua with reflex urine cx and sti testing  Health maintenance = plan to give Covid/flu vaccine and Anal  pap; gave refills on rosuvastatin   Left toe nail = appears benign. Continue to monitor if worsens then refer to podiatry.

## 2024-09-12 LAB — URINALYSIS W MICROSCOPIC + REFLEX CULTURE
Bacteria, UA: NONE SEEN /HPF
Bilirubin Urine: NEGATIVE
Glucose, UA: NEGATIVE
Hgb urine dipstick: NEGATIVE
Hyaline Cast: NONE SEEN /LPF
Leukocyte Esterase: NEGATIVE
Nitrites, Initial: NEGATIVE
Protein, ur: NEGATIVE
RBC / HPF: NONE SEEN /HPF (ref 0–2)
Specific Gravity, Urine: 1.026 (ref 1.001–1.035)
Squamous Epithelial / HPF: NONE SEEN /HPF (ref <=5)
WBC, UA: NONE SEEN /HPF (ref 0–5)
pH: 5.5 (ref 5.0–8.0)

## 2024-09-12 LAB — NO CULTURE INDICATED

## 2024-09-14 ENCOUNTER — Other Ambulatory Visit: Payer: Self-pay

## 2024-09-14 ENCOUNTER — Other Ambulatory Visit (HOSPITAL_COMMUNITY): Payer: Self-pay

## 2024-09-14 LAB — URINE CYTOLOGY ANCILLARY ONLY
Chlamydia: NEGATIVE
Comment: NEGATIVE
Comment: NORMAL
Neisseria Gonorrhea: NEGATIVE

## 2024-09-15 ENCOUNTER — Telehealth: Payer: Self-pay

## 2024-09-15 NOTE — Telephone Encounter (Signed)
 Pt was seen by Sinder. KH

## 2024-09-15 NOTE — Telephone Encounter (Signed)
 Patient called requesting to review recent lab results. Reviewed the following:   Urine negative for gonorrhea/chlamydia RPR negative CD4 count healthy at over 1200  Anal pap still pending  viral load still pending   Patient verbalized understanding and has no further questions.   Caley Ciaramitaro, BSN, RN

## 2024-09-16 ENCOUNTER — Other Ambulatory Visit (HOSPITAL_COMMUNITY): Payer: Self-pay

## 2024-09-16 ENCOUNTER — Ambulatory Visit: Admitting: Podiatry

## 2024-09-16 LAB — COMPLETE METABOLIC PANEL WITHOUT GFR
AG Ratio: 1.3 (calc) (ref 1.0–2.5)
ALT: 26 U/L (ref 9–46)
AST: 22 U/L (ref 10–40)
Albumin: 4.5 g/dL (ref 3.6–5.1)
Alkaline phosphatase (APISO): 81 U/L (ref 36–130)
BUN: 16 mg/dL (ref 7–25)
CO2: 27 mmol/L (ref 20–32)
Calcium: 9.8 mg/dL (ref 8.6–10.3)
Chloride: 103 mmol/L (ref 98–110)
Creat: 1.22 mg/dL (ref 0.60–1.29)
Globulin: 3.6 g/dL (ref 1.9–3.7)
Glucose, Bld: 89 mg/dL (ref 65–99)
Potassium: 4.6 mmol/L (ref 3.5–5.3)
Sodium: 138 mmol/L (ref 135–146)
Total Bilirubin: 0.5 mg/dL (ref 0.2–1.2)
Total Protein: 8.1 g/dL (ref 6.1–8.1)

## 2024-09-16 LAB — CBC WITH DIFFERENTIAL/PLATELET
Absolute Lymphocytes: 3258 {cells}/uL (ref 850–3900)
Absolute Monocytes: 550 {cells}/uL (ref 200–950)
Basophils Absolute: 32 {cells}/uL (ref 0–200)
Basophils Relative: 0.5 %
Eosinophils Absolute: 173 {cells}/uL (ref 15–500)
Eosinophils Relative: 2.7 %
HCT: 39.6 % (ref 38.5–50.0)
Hemoglobin: 13 g/dL — ABNORMAL LOW (ref 13.2–17.1)
MCH: 30.3 pg (ref 27.0–33.0)
MCHC: 32.8 g/dL (ref 32.0–36.0)
MCV: 92.3 fL (ref 80.0–100.0)
MPV: 9.4 fL (ref 7.5–12.5)
Monocytes Relative: 8.6 %
Neutro Abs: 2387 {cells}/uL (ref 1500–7800)
Neutrophils Relative %: 37.3 %
Platelets: 443 Thousand/uL — ABNORMAL HIGH (ref 140–400)
RBC: 4.29 Million/uL (ref 4.20–5.80)
RDW: 13.6 % (ref 11.0–15.0)
Total Lymphocyte: 50.9 %
WBC: 6.4 Thousand/uL (ref 3.8–10.8)

## 2024-09-16 LAB — HIV-1 RNA QUANT-NO REFLEX-BLD
HIV 1 RNA Quant: NOT DETECTED {copies}/mL
HIV-1 RNA Quant, Log: NOT DETECTED {Log_copies}/mL

## 2024-09-16 LAB — RPR: RPR Ser Ql: NONREACTIVE

## 2024-09-16 LAB — T-HELPER CELLS (CD4) COUNT (NOT AT ARMC)
Absolute CD4: 1291 {cells}/uL (ref 490–1740)
CD4 T Helper %: 41 % (ref 30–61)
Total lymphocyte count: 3164 {cells}/uL (ref 850–3900)

## 2024-09-17 ENCOUNTER — Telehealth: Payer: Self-pay

## 2024-09-17 LAB — CYTOLOGY - PAP: Diagnosis: NEGATIVE

## 2024-09-17 NOTE — Telephone Encounter (Signed)
 Patient reports that prescribed  Diclofenac  Sodium 1% topical gel (2 g applied four times daily) are not covered by insurance. Patient expresses concern about the cost and requests recommendations for alternative topical pain relief options. Routing to provider for advise. Patient will also contact insurance and let staff know of any alternatives that may be covered.   Enis Kleine, LPN

## 2024-09-22 ENCOUNTER — Telehealth: Payer: Self-pay

## 2024-09-22 ENCOUNTER — Other Ambulatory Visit: Payer: Self-pay

## 2024-09-22 DIAGNOSIS — Z113 Encounter for screening for infections with a predominantly sexual mode of transmission: Secondary | ICD-10-CM

## 2024-09-22 NOTE — Telephone Encounter (Signed)
 Triage returned missed call. Patient unable to reach at this time. Left HIPAA compliant voicemail to contact our office.   Enis Kleine, LPN

## 2024-09-23 ENCOUNTER — Telehealth: Payer: Self-pay

## 2024-09-23 DIAGNOSIS — Z113 Encounter for screening for infections with a predominantly sexual mode of transmission: Secondary | ICD-10-CM

## 2024-09-23 NOTE — Telephone Encounter (Addendum)
 Patient advised of normal labs.  Patient reports still having pain with urination.  Per Dr. Luiz patient will need to come in for another urine specimen to test for mycoplasma/GC and chlamydia  Patient advised to come in for additional test and if testing is negative he will need to be referred to urology.  Patient verbalized understanding and will call Friday morning to let us  know when he can come in for testing. Todd Carroll, CMA

## 2024-09-24 ENCOUNTER — Other Ambulatory Visit (HOSPITAL_COMMUNITY)
Admission: RE | Admit: 2024-09-24 | Discharge: 2024-09-24 | Disposition: A | Discharge status: Home / Self Care | Source: Ambulatory Visit | Attending: Internal Medicine | Admitting: Internal Medicine

## 2024-09-24 ENCOUNTER — Other Ambulatory Visit

## 2024-09-24 ENCOUNTER — Other Ambulatory Visit: Payer: Self-pay

## 2024-09-24 DIAGNOSIS — Z113 Encounter for screening for infections with a predominantly sexual mode of transmission: Secondary | ICD-10-CM

## 2024-09-24 LAB — CYTOLOGY, (ORAL, ANAL, URETHRAL) ANCILLARY ONLY
Chlamydia: NEGATIVE
Chlamydia: NEGATIVE
Comment: NEGATIVE
Comment: NEGATIVE
Comment: NEGATIVE
Comment: NEGATIVE
Comment: NORMAL
Comment: NORMAL
Neisseria Gonorrhea: NEGATIVE
Neisseria Gonorrhea: NEGATIVE
Trichomonas: NEGATIVE
Trichomonas: NEGATIVE

## 2024-09-26 LAB — SURESWAB MYCOPLASMA GENITALIUM, PCR: MYCOPLASMA GENITALIUM, rRNA,TMA: NOT DETECTED

## 2024-09-28 ENCOUNTER — Ambulatory Visit (INDEPENDENT_AMBULATORY_CARE_PROVIDER_SITE_OTHER): Admitting: Podiatry

## 2024-09-28 ENCOUNTER — Encounter: Payer: Self-pay | Admitting: Podiatry

## 2024-09-28 VITALS — Ht 73.0 in | Wt 231.0 lb

## 2024-09-28 DIAGNOSIS — L600 Ingrowing nail: Secondary | ICD-10-CM | POA: Diagnosis not present

## 2024-09-28 NOTE — Progress Notes (Signed)
   Chief Complaint  Patient presents with   Ingrown Toenail    Pt is here due to bilateral ingrown to the great toenails states the left toe is worse, would like to have both removed.    Subjective: Patient presents today for evaluation of pain to the medial border left great toe. Patient is concerned for possible ingrown nail.  It is very sensitive to touch.  Patient presents today for further treatment and evaluation.  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  Medial border left great toe is tender with evidence of an ingrowing nail. Pain on palpation noted to the border of the nail fold. The remaining nails appear unremarkable at this time.   Vascular: DP and PT pulses palpable.  No clinical evidence of vascular compromise  Neruologic: Grossly intact via light touch bilateral.  Musculoskeletal: No pedal deformity noted  Assesement: #1 Paronychia with ingrowing nail medial border left great toe  Plan of Care:  -Patient evaluated.  -Discussed treatment alternatives and plan of care. Explained nail avulsion procedure and post procedure course to patient. -Patient opted for permanent partial nail avulsion of the ingrown portion of the nail.  -Prior to procedure, local anesthesia infiltration utilized using 3 ml of a 50:50 mixture of 2% plain lidocaine  and 0.5% plain  marcaine in a normal hallux block fashion and a betadine prep performed.  -Partial permanent nail avulsion with chemical matrixectomy performed using 3x30sec applications of phenol followed by alcohol flush.  -Light dressing applied.  Post care instructions provided -Return to clinic 3 weeks  *Drives for [employer] and [employer]  Thresa EMERSON Sar, DPM [facility]  Dr. Thresa EMERSON Sar, DPM    [address] [place], [place] [address]                Office [phone]  Fax [phone]

## 2024-09-28 NOTE — Patient Instructions (Signed)

## 2024-09-29 ENCOUNTER — Other Ambulatory Visit (HOSPITAL_BASED_OUTPATIENT_CLINIC_OR_DEPARTMENT_OTHER): Payer: Self-pay

## 2024-09-30 ENCOUNTER — Other Ambulatory Visit: Payer: Self-pay

## 2024-09-30 DIAGNOSIS — R350 Frequency of micturition: Secondary | ICD-10-CM

## 2024-10-01 ENCOUNTER — Other Ambulatory Visit: Payer: Self-pay

## 2024-10-01 ENCOUNTER — Other Ambulatory Visit (HOSPITAL_COMMUNITY): Payer: Self-pay

## 2024-10-05 ENCOUNTER — Other Ambulatory Visit (HOSPITAL_COMMUNITY): Payer: Self-pay

## 2024-10-05 ENCOUNTER — Other Ambulatory Visit: Payer: Self-pay

## 2024-10-06 ENCOUNTER — Other Ambulatory Visit (HOSPITAL_COMMUNITY): Payer: Self-pay

## 2024-10-06 ENCOUNTER — Other Ambulatory Visit: Payer: Self-pay

## 2024-10-07 ENCOUNTER — Other Ambulatory Visit: Payer: Self-pay

## 2024-10-07 ENCOUNTER — Other Ambulatory Visit (HOSPITAL_COMMUNITY): Payer: Self-pay

## 2024-10-07 ENCOUNTER — Other Ambulatory Visit: Payer: Self-pay | Admitting: Internal Medicine

## 2024-10-07 DIAGNOSIS — A6001 Herpesviral infection of penis: Secondary | ICD-10-CM

## 2024-10-07 MED ORDER — VALACYCLOVIR HCL 500 MG PO TABS
500.0000 mg | ORAL_TABLET | Freq: Every day | ORAL | 3 refills | Status: AC
  Filled 2024-10-07: qty 90, 90d supply, fill #0
  Filled 2024-11-02: qty 30, 30d supply, fill #0
  Filled 2024-11-27 – 2024-11-30 (×2): qty 30, 30d supply, fill #1
  Filled 2024-12-17 – 2024-12-28 (×2): qty 30, 30d supply, fill #2
  Filled 2024-12-30 – 2025-01-06 (×6): qty 30, 30d supply, fill #3

## 2024-10-12 ENCOUNTER — Telehealth: Payer: Self-pay

## 2024-10-12 NOTE — Telephone Encounter (Signed)
 Copied from CRM #[id number]. Topic: Clinical - Medical Advice >> Oct 09, 2024  4:09 PM Fonda T wrote: Reason for CRM: Patient calling, states he would like to speak directly to nurse at office, Luke.  No further information provided.   Patient can be reached back at [phone].  Lvm for pt to call. KH

## 2024-10-19 ENCOUNTER — Ambulatory Visit (INDEPENDENT_AMBULATORY_CARE_PROVIDER_SITE_OTHER): Admitting: Podiatry

## 2024-10-19 ENCOUNTER — Encounter: Payer: Self-pay | Admitting: Podiatry

## 2024-10-19 VITALS — Ht 73.0 in | Wt 231.0 lb

## 2024-10-19 DIAGNOSIS — L600 Ingrowing nail: Secondary | ICD-10-CM | POA: Diagnosis not present

## 2024-10-19 NOTE — Progress Notes (Signed)
   Chief Complaint  Patient presents with   Ingrown Toenail    Pt is here to f/u on ingrown removed at his last visit, he has no complaints.    Subjective: 48 y.o. male presents today status post permanent nail avulsion procedure of the medial border left great toe that was performed on 09/28/2024.   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  Skin is warm, dry and supple. Nail and respective nail fold appears to be healing appropriately.   Assessment: #1 s/p partial permanent nail matrixectomy medial border left great toe.  09/28/2024   Plan of care: #1 patient was evaluated  #2 light debridement of the periungual debris was performed to the border of the respective toe and nail plate using a tissue nipper. #3 patient is to return to clinic on a PRN basis.  *Drives for [employer] and [employer]   Thresa EMERSON Sar, DPM [facility]  Dr. Thresa EMERSON Sar, DPM    [address] [place], [place] [address]                Office [phone]  Fax [phone]

## 2024-10-22 ENCOUNTER — Telehealth: Payer: Self-pay

## 2024-10-22 ENCOUNTER — Telehealth: Payer: Self-pay | Admitting: Nurse Practitioner

## 2024-10-22 NOTE — Telephone Encounter (Signed)
 Patient walked in to clinic requesting to speak with a nurse. States that for the last couple of weeks he has had bilateral numbness, tingling, and tightness in his arms and hands. It mostly occurs at night.   Encouraged him to reach out to his PCP to discuss for further evaluation and work up. Notified him he has an appointment with Bascom Borer, NP on 12/4 and provided him with her clinic's number.   Patient verbalized understanding and has no further questions.   Sharad Vaneaton, BSN, RN

## 2024-10-22 NOTE — Telephone Encounter (Signed)
 Copied from CRM #[id number]. Topic: Clinical - Medical Advice >> Oct 21, 2024  2:49 PM Thersia BROCKS wrote: Reason for CRM: Patient called in regarding needing to speak to Nurse Luke,  [phone]

## 2024-10-30 ENCOUNTER — Other Ambulatory Visit: Payer: Self-pay

## 2024-10-30 ENCOUNTER — Other Ambulatory Visit (HOSPITAL_COMMUNITY): Payer: Self-pay

## 2024-11-02 ENCOUNTER — Other Ambulatory Visit: Payer: Self-pay

## 2024-11-02 ENCOUNTER — Other Ambulatory Visit (HOSPITAL_COMMUNITY): Payer: Self-pay

## 2024-11-02 MED ORDER — CYCLOSPORINE 0.05 % OP EMUL
1.0000 [drp] | Freq: Two times a day (BID) | OPHTHALMIC | 12 refills | Status: AC
  Filled 2024-11-02 (×2): qty 180, 90d supply, fill #0
  Filled 2024-12-15 – 2024-12-30 (×4): qty 180, 90d supply, fill #1

## 2024-11-02 NOTE — Progress Notes (Signed)
 Specialty Pharmacy Refill Coordination Note  Todd Carroll is a 48 y.o. male contacted today regarding refills of specialty medication(s) Bictegravir-Emtricitab-Tenofov (Biktarvy )   Patient requested Delivery   Delivery date: 11/03/24   Verified address: [address] [place]   Medication will be filled on: 11/02/24

## 2024-11-03 ENCOUNTER — Other Ambulatory Visit: Payer: Self-pay

## 2024-11-03 ENCOUNTER — Ambulatory Visit

## 2024-11-03 ENCOUNTER — Other Ambulatory Visit (HOSPITAL_COMMUNITY): Payer: Self-pay

## 2024-11-03 MED ORDER — PREVIDENT 5000 BOOSTER PLUS 1.1 % DT PSTE
PASTE | DENTAL | 10 refills | Status: AC
  Filled 2024-11-03: qty 100, 30d supply, fill #0
  Filled 2024-11-26 – 2024-12-15 (×3): qty 100, 30d supply, fill #1
  Filled 2024-12-28 – 2024-12-30 (×2): qty 100, 30d supply, fill #2

## 2024-11-04 ENCOUNTER — Other Ambulatory Visit: Payer: Self-pay | Admitting: Nurse Practitioner

## 2024-11-04 ENCOUNTER — Other Ambulatory Visit: Payer: Self-pay

## 2024-11-04 NOTE — Telephone Encounter (Signed)
 No answer lvm for call back. KH

## 2024-11-05 ENCOUNTER — Emergency Department (HOSPITAL_COMMUNITY)
Admission: EM | Admit: 2024-11-05 | Discharge: 2024-11-05 | Disposition: A | Discharge status: Home / Self Care | Attending: Emergency Medicine | Admitting: Emergency Medicine

## 2024-11-05 ENCOUNTER — Other Ambulatory Visit: Payer: Self-pay

## 2024-11-05 ENCOUNTER — Telehealth (HOSPITAL_COMMUNITY): Payer: Self-pay

## 2024-11-05 ENCOUNTER — Telehealth: Payer: Self-pay | Admitting: Nurse Practitioner

## 2024-11-05 ENCOUNTER — Emergency Department (HOSPITAL_COMMUNITY)

## 2024-11-05 ENCOUNTER — Other Ambulatory Visit (HOSPITAL_COMMUNITY): Payer: Self-pay

## 2024-11-05 ENCOUNTER — Ambulatory Visit: Payer: Self-pay | Admitting: Nurse Practitioner

## 2024-11-05 ENCOUNTER — Encounter: Payer: Self-pay | Admitting: Nurse Practitioner

## 2024-11-05 VITALS — BP 123/76 | HR 96 | Wt 233.0 lb

## 2024-11-05 DIAGNOSIS — R7303 Prediabetes: Secondary | ICD-10-CM | POA: Diagnosis not present

## 2024-11-05 DIAGNOSIS — M25552 Pain in left hip: Secondary | ICD-10-CM | POA: Diagnosis not present

## 2024-11-05 DIAGNOSIS — W19XXXA Unspecified fall, initial encounter: Secondary | ICD-10-CM | POA: Diagnosis not present

## 2024-11-05 DIAGNOSIS — M25512 Pain in left shoulder: Secondary | ICD-10-CM | POA: Insufficient documentation

## 2024-11-05 DIAGNOSIS — Y92002 Bathroom of unspecified non-institutional (private) residence single-family (private) house as the place of occurrence of the external cause: Secondary | ICD-10-CM | POA: Insufficient documentation

## 2024-11-05 DIAGNOSIS — K219 Gastro-esophageal reflux disease without esophagitis: Secondary | ICD-10-CM | POA: Diagnosis not present

## 2024-11-05 DIAGNOSIS — W0110XA Fall on same level from slipping, tripping and stumbling with subsequent striking against unspecified object, initial encounter: Secondary | ICD-10-CM | POA: Insufficient documentation

## 2024-11-05 DIAGNOSIS — R6884 Jaw pain: Secondary | ICD-10-CM | POA: Diagnosis not present

## 2024-11-05 DIAGNOSIS — G44309 Post-traumatic headache, unspecified, not intractable: Secondary | ICD-10-CM

## 2024-11-05 DIAGNOSIS — Z043 Encounter for examination and observation following other accident: Secondary | ICD-10-CM | POA: Diagnosis not present

## 2024-11-05 DIAGNOSIS — H9192 Unspecified hearing loss, left ear: Secondary | ICD-10-CM | POA: Diagnosis not present

## 2024-11-05 DIAGNOSIS — L299 Pruritus, unspecified: Secondary | ICD-10-CM

## 2024-11-05 LAB — POCT GLYCOSYLATED HEMOGLOBIN (HGB A1C): Hemoglobin A1C: 5.7 % — AB (ref 4.0–5.6)

## 2024-11-05 MED ORDER — IBUPROFEN 200 MG PO TABS
600.0000 mg | ORAL_TABLET | Freq: Once | ORAL | Status: AC
  Administered 2024-11-05: 600 mg via ORAL
  Filled 2024-11-05: qty 3

## 2024-11-05 MED ORDER — ROSUVASTATIN CALCIUM 10 MG PO TABS
10.0000 mg | ORAL_TABLET | Freq: Every day | ORAL | 3 refills | Status: AC
  Filled 2024-11-05 – 2024-12-02 (×3): qty 90, 90d supply, fill #0

## 2024-11-05 MED ORDER — HYDROXYZINE PAMOATE 50 MG PO CAPS
50.0000 mg | ORAL_CAPSULE | Freq: Three times a day (TID) | ORAL | 0 refills | Status: AC | PRN
  Filled 2024-11-05: qty 270, 90d supply, fill #0
  Filled 2024-12-02: qty 90, 30d supply, fill #0

## 2024-11-05 MED ORDER — DICLOFENAC SODIUM 1 % EX GEL
2.0000 g | Freq: Four times a day (QID) | CUTANEOUS | 1 refills | Status: DC
  Filled 2024-11-05: qty 100, 30d supply, fill #0
  Filled 2024-11-05: qty 100, 8d supply, fill #0
  Filled 2024-11-05: qty 100, 13d supply, fill #0
  Filled 2024-11-10: qty 100, 8d supply, fill #1
  Filled 2024-11-14: qty 100, 8d supply, fill #2

## 2024-11-05 MED ORDER — CLINDAMYCIN PHOS (ONCE-DAILY) 1 % EX GEL
1.0000 | CUTANEOUS | 1 refills | Status: DC | PRN
  Filled 2024-11-05 (×2): qty 75, 30d supply, fill #0
  Filled 2024-11-05: qty 75, fill #0
  Filled 2024-11-05: qty 75, 30d supply, fill #0

## 2024-11-05 MED ORDER — FEXOFENADINE HCL 180 MG PO TABS
180.0000 mg | ORAL_TABLET | Freq: Every day | ORAL | 2 refills | Status: DC
  Filled 2024-11-05 (×3): qty 90, 90d supply, fill #0
  Filled 2024-11-05: qty 30, 30d supply, fill #0
  Filled 2024-11-12 – 2024-12-04 (×3): qty 90, 90d supply, fill #0

## 2024-11-05 MED ORDER — CYCLOBENZAPRINE HCL 10 MG PO TABS
10.0000 mg | ORAL_TABLET | Freq: Three times a day (TID) | ORAL | 0 refills | Status: DC | PRN
  Filled 2024-11-05 (×2): qty 30, 10d supply, fill #0

## 2024-11-05 MED ORDER — OMEPRAZOLE 40 MG PO CPDR
40.0000 mg | DELAYED_RELEASE_CAPSULE | Freq: Every day | ORAL | 1 refills | Status: DC | PRN
  Filled 2024-11-05 – 2024-11-09 (×2): qty 90, 90d supply, fill #0

## 2024-11-05 NOTE — Telephone Encounter (Signed)
 Spoke to pt at today appt. KH

## 2024-11-05 NOTE — Discharge Instructions (Signed)
 Return for any problem.  CT and Xray studies are normal - you have no broken bones.   Use tylenol  and motrin for pain.

## 2024-11-05 NOTE — ED Triage Notes (Signed)
 Pt states that earlier this morning he was in [facility] and slipped and fell in the bathroom. Pt states that he hit his head on the stall door. Pt denies LOC. C/o head and jaw pain, L shoulder, and L hip pain.

## 2024-11-05 NOTE — ED Provider Notes (Signed)
 [place] EMERGENCY DEPARTMENT AT [facility] Provider Note   CSN: [id number] Arrival date & time: 11/05/24  1022     Patient presents with: Felton Bruckner CHUKWUEBUKA CHURCHILL is a 48 y.o. male.   48 year old male with prior medical history as detailed below presents for evaluation .  Patient reports that he was at [facility].  He slipped on the wet floor while in the bathroom.  He reports that he fell and hit his head and now has left jaw pain.  He also complains of left shoulder pain.  He specifically requests an x-ray or CT scan of his jaw to make sure that it is not broken.  He is speaking without difficulty.  He is swallowing without difficulty.  He is moving his bilateral upper extremities and ambulating without difficulty.  He appears to be in no distress.  No LOC reported.  No neck pain reported.  The history is provided by the patient and medical records.       Prior to Admission medications   Medication Sig Start Date End Date Taking? Authorizing Provider  albuterol  (VENTOLIN  HFA) 108 (90 Base) MCG/ACT inhaler Inhale 1-2 puffs into the lungs every 6 (six) hours as needed for wheezing or shortness of breath. 02/05/24   Oley Bascom RAMAN, NP  bictegravir-emtricitabine -tenofovir  AF (BIKTARVY ) 50-200-25 MG TABS tablet Take 1 tablet by mouth daily. CALL [phone] FOR AN APPOINTMENT 09/11/24   Luiz Channel, MD  clindamycin  (CLINDAGEL ) 1 % gel Apply topically 2 (two) times daily. Patient not taking: Reported on 11/05/2024 02/05/24   Nichols, Tonya S, NP  Clindamycin  Phos, Once-Daily, (CLINDAGEL ) 1 % GEL Apply 1 Dose topically as needed. 11/05/24   Oley Bascom RAMAN, NP  cyclobenzaprine  (FLEXERIL ) 10 MG tablet Take 1 tablet (10 mg total) by mouth 3 (three) times daily as needed for muscle spasms. 11/05/24   Nichols, Tonya S, NP  cycloSPORINE (RESTASIS) 0.05 % ophthalmic emulsion Place 1 drop into both eyes 2 (two) times daily. 11/02/24     Dentifrices (LUMINEUX WHITENING  TOOTHPASTE) PSTE Place 1 Application onto teeth daily. Patient not taking: Reported on 11/05/2024 05/26/24   [name], [name], FNP  diclofenac  Sodium (VOLTAREN ) 1 % GEL Apply 2 g topically 4 (four) times daily. 11/05/24   Oley Bascom RAMAN, NP  Diclofenac  Sodium CR 100 MG 24 hr tablet Take 1 tablet (100 mg total) by mouth daily. Patient not taking: Reported on 11/05/2024 07/14/23   Palumbo, April, MD  fexofenadine  (ALLEGRA  ALLERGY) 180 MG tablet Take 1 tablet (180 mg total) by mouth daily. 11/05/24   Oley Bascom RAMAN, NP  hydrOXYzine  (VISTARIL ) 50 MG capsule TAKE 1 CAPSULE(50 MG) BY MOUTH THREE TIMES DAILY AS NEEDED 08/13/24   Nichols, Tonya S, NP  hydrOXYzine  (VISTARIL ) 50 MG capsule Take 1 capsule (50 mg total) by mouth 3 (three) times daily as needed. 11/05/24 02/03/25  Nichols, Tonya S, NP  lidocaine  (LIDODERM ) 5 % Place 1 patch onto the skin daily. Remove & Discard patch within 12 hours or as directed by MD Patient not taking: Reported on 11/05/2024 07/14/23   Palumbo, April, MD  meloxicam  (MOBIC ) 7.5 MG tablet Take 1 tablet (7.5 mg total) by mouth daily as needed for pain. 05/28/24   Oley Bascom RAMAN, NP  moxifloxacin  (VIGAMOX ) 0.5 % ophthalmic solution Place 1 drop into both eyes 3 (three) times daily. Patient not taking: Reported on 11/05/2024 02/05/24   Nichols, Tonya S, NP  multivitamin (ONE-A-DAY MEN'S) TABS tablet Take 1 tablet by  mouth daily. 02/05/24   Oley Bascom RAMAN, NP  omeprazole  (PRILOSEC) 40 MG capsule Take 1 capsule (40 mg total) by mouth daily as needed. 11/05/24   Oley Bascom RAMAN, NP  rosuvastatin  (CRESTOR ) 10 MG tablet Take 1 tablet (10 mg total) by mouth daily. 11/05/24   Oley Bascom RAMAN, NP  Sodium Fluoride  (PREVIDENT 5000 BOOSTER PLUS) 1.1 % PSTE USE AS DIRECTED: BRUSH ONTO TEETH FOR NIGHT TIME MOUTH CARE . SPIT OUT EXCESS. DO NOT RINSE. 11/03/24     sodium fluoride  (PREVIDENT 5000 PLUS) 1.1 % CREA dental cream Use a pea sized amount of this toothpaste in the evening 09/11/24   Luiz Channel, MD  tiZANidine  (ZANAFLEX ) 4 MG tablet Take 1 tablet (4 mg total) by mouth 2 (two) times daily as needed. Patient not taking: Reported on 11/05/2024 02/05/24   Nichols, Tonya S, NP  triamcinolone  (KENALOG ) 0.025 % ointment Apply topically 2 (two) times daily. Patient not taking: Reported on 11/05/2024 09/11/24 03/10/25  Luiz Channel, MD  triamcinolone  ointment (KENALOG ) 0.5 % Apply 1 Application topically 2 (two) times daily. 02/05/24   Oley Bascom RAMAN, NP  valACYclovir  (VALTREX ) 500 MG tablet Take 1 tablet (500 mg total) by mouth daily. 10/07/24   Luiz Channel, MD    Allergies: Azithromycin     Review of Systems  All other systems reviewed and are negative.   Updated Vital Signs BP 131/77 (BP Location: Left Arm)   Pulse 80   Temp 98.7 F (37.1 C) (Oral)   Resp 16   SpO2 100%   Physical Exam Vitals and nursing note reviewed.  Constitutional:      General: He is not in acute distress.    Appearance: Normal appearance. He is well-developed.  HENT:     Head: Normocephalic and atraumatic.     Mouth/Throat:     Mouth: Mucous membranes are moist.     Comments: Patient with full active ranging of his jaw.  He is speaking without difficulty.  He has normal dentition.  There is no evidence of significant traumatic injury to his face on exam.  His left shoulder is also reassuringly normal on exam.  There is no significant decreased active range of motion.  There is no specific point tenderness identified on exam.  Distal left upper extremity is neurovascular intact. Eyes:     Extraocular Movements: Extraocular movements intact.     Conjunctiva/sclera: Conjunctivae normal.     Pupils: Pupils are equal, round, and reactive to light.  Cardiovascular:     Rate and Rhythm: Normal rate and regular rhythm.     Heart sounds: No murmur heard. Pulmonary:     Effort: Pulmonary effort is normal. No respiratory distress.     Breath sounds: Normal breath sounds.  Abdominal:     Palpations:  Abdomen is soft.     Tenderness: There is no abdominal tenderness.  Musculoskeletal:        General: No swelling or tenderness.     Cervical back: Neck supple.  Skin:    General: Skin is warm and dry.     Capillary Refill: Capillary refill takes less than 2 seconds.  Neurological:     General: No focal deficit present.     Mental Status: He is alert and oriented to person, place, and time.  Psychiatric:        Mood and Affect: Mood normal.     (all labs ordered are listed, but only abnormal results are displayed) Labs Reviewed - No data  to display  EKG: None  Radiology: No results found.   Procedures   Medications Ordered in the ED  ibuprofen (ADVIL) tablet 600 mg (has no administration in time range)                                    Medical Decision Making Patient request evaluation after ground-level fall.  Exam does not suggest significant injury.  Obtained imaging is also reassuringly without acute abnormality.  Patient is appropriate for discharge.  Importance of close follow-up stressed.  Strict return precautions given and understood.  Amount and/or Complexity of Data Reviewed Radiology: ordered.  Risk OTC drugs.        Final diagnoses:  Fall, initial encounter    ED Discharge Orders     None          Laurice Maude BROCKS, MD 11/05/24 1144

## 2024-11-05 NOTE — Telephone Encounter (Signed)
 Copied from CRM #[id number]. Topic: General - Other >> Nov 05, 2024 12:03 PM Charlet HERO wrote: Reason for CRM: Patient is calling to have Nurse [name] call him. Please reach out to the patient at number on chart.

## 2024-11-05 NOTE — Progress Notes (Signed)
 Subjective   Patient ID: Todd Carroll, male    DOB: March 11, 1976, 48 y.o.   MRN: [id number]  Chief Complaint  Patient presents with   Fall   Hearing Loss    Left ear and some in right    Referring provider: Oley Bascom RAMAN, NP  Todd Carroll is a 48 y.o. 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  Patient states that he did fall this morning in [employer] bathroom.  We will take him to the emergency room for further evaluation and scans.  He is complaining of hitting his head and also left hip pain and left shoulder pain.  Fall The accident occurred 1 to 3 hours ago. The fall occurred while walking. He fell from a height of 1 to 2 ft. He landed on Hard floor. The point of impact was the head, left hip and left shoulder. The pain is present in the left hip, left shoulder and head. The pain is at a severity of 8/10. The pain is moderate. The symptoms are aggravated by movement.     Patient presents today for a follow-up and medication refills.  He was last seen in this office on 05/02/2023.  He does have a history of diabetes. A1C today is 5.7. Denies f/c/s, n/v/d, hemoptysis, PND, leg swelling. Denies chest pain or edema.   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  Will refer back to ENT.    Allergies  Allergen Reactions   Azithromycin  Itching    Immunization History  Administered Date(s) Administered   HPV 9-valent 05/15/2018,  06/13/2018   Hepatitis A 04/02/2013   Hepatitis A, Adult 10/23/2013   Hepatitis B 04/02/2013, 06/02/2013   Hepatitis B, ADULT 01/05/2014   Influenza Split 09/14/2011   Influenza, Seasonal, Injecte, Preservative Fre 02/05/2024, 09/11/2024   Influenza,inj,Quad PF,6+ Mos 10/23/2013, 11/01/2014, 11/15/2015, 08/31/2016, 08/31/2016, 07/24/2017, 09/11/2018, 09/16/2020, 11/02/2021   Meningococcal Mcv4o 03/06/2017, 09/12/2018, 04/16/2024   PFIZER(Purple Top)SARS-COV-2 Vaccination 03/23/2020, 04/12/2020, 11/08/2020   PNEUMOCOCCAL CONJUGATE-20 04/16/2024   PPD Test 03/19/2013   Pfizer(Comirnaty)Fall Seasonal Vaccine 12 years and older 09/11/2024   Pneumococcal Conjugate-13 05/15/2018   Pneumococcal Polysaccharide-23 03/19/2013, 12/31/2018   Td 12/03/2006   Tdap 07/24/2017, 07/14/2023   Vaccinia,smallpox Monkeypox Vaccine Live,pf 08/03/2021   Zoster Recombinant(Shingrix ) 04/16/2024    Tobacco History: Social History   Tobacco Use  Smoking Status Never  Smokeless Tobacco Never   Counseling given: Not Answered   Outpatient Encounter Medications as of 11/05/2024  Medication Sig   albuterol  (VENTOLIN  HFA) 108 (90 Base) MCG/ACT inhaler Inhale 1-2 puffs into the lungs every 6 (six) hours as needed for wheezing or shortness of breath.   bictegravir-emtricitabine -tenofovir  AF (BIKTARVY ) 50-200-25 MG TABS tablet Take 1 tablet by mouth daily. CALL [phone] FOR AN APPOINTMENT   cyclobenzaprine  (FLEXERIL ) 10 MG tablet Take 1 tablet (10 mg total) by mouth 3 (three) times daily as needed for muscle  spasms.   cycloSPORINE (RESTASIS) 0.05 % ophthalmic emulsion Place 1 drop into both eyes 2 (two) times daily.   hydrOXYzine  (VISTARIL ) 50 MG capsule TAKE 1 CAPSULE(50 MG) BY MOUTH THREE TIMES DAILY AS NEEDED   meloxicam  (MOBIC ) 7.5 MG tablet Take 1 tablet (7.5 mg total) by mouth daily as needed for pain.   multivitamin (ONE-A-DAY MEN'S) TABS tablet Take 1 tablet by mouth daily.   Sodium Fluoride   (PREVIDENT 5000 BOOSTER PLUS) 1.1 % PSTE USE AS DIRECTED: BRUSH ONTO TEETH FOR NIGHT TIME MOUTH CARE . SPIT OUT EXCESS. DO NOT RINSE.   sodium fluoride  (PREVIDENT 5000 PLUS) 1.1 % CREA dental cream Use a pea sized amount of this toothpaste in the evening   triamcinolone  ointment (KENALOG ) 0.5 % Apply 1 Application topically 2 (two) times daily.   valACYclovir  (VALTREX ) 500 MG tablet Take 1 tablet (500 mg total) by mouth daily.   [DISCONTINUED] hydrOXYzine  (VISTARIL ) 50 MG capsule Take 1 capsule (50 mg total) by mouth 3 (three) times daily as needed.   [DISCONTINUED] omeprazole  (PRILOSEC) 40 MG capsule Take 1 capsule (40 mg total) by mouth daily as needed.   [DISCONTINUED] rosuvastatin  (CRESTOR ) 10 MG tablet Take 1 tablet (10 mg total) by mouth daily.   clindamycin  (CLINDAGEL ) 1 % gel Apply topically 2 (two) times daily. (Patient not taking: Reported on 11/05/2024)   Clindamycin  Phos, Once-Daily, (CLINDAGEL ) 1 % GEL Apply 1 Dose topically as needed.   Dentifrices (LUMINEUX WHITENING TOOTHPASTE) PSTE Place 1 Application onto teeth daily. (Patient not taking: Reported on 11/05/2024)   diclofenac  Sodium (VOLTAREN ) 1 % GEL Apply 2 g topically 4 (four) times daily.   Diclofenac  Sodium CR 100 MG 24 hr tablet Take 1 tablet (100 mg total) by mouth daily. (Patient not taking: Reported on 11/05/2024)   fexofenadine  (ALLEGRA  ALLERGY) 180 MG tablet Take 1 tablet (180 mg total) by mouth daily.   hydrOXYzine  (VISTARIL ) 50 MG capsule Take 1 capsule (50 mg total) by mouth 3 (three) times daily as needed.   lidocaine  (LIDODERM ) 5 % Place 1 patch onto the skin daily. Remove & Discard patch within 12 hours or as directed by MD (Patient not taking: Reported on 11/05/2024)   moxifloxacin  (VIGAMOX ) 0.5 % ophthalmic solution Place 1 drop into both eyes 3 (three) times daily. (Patient not taking: Reported on 11/05/2024)   omeprazole  (PRILOSEC) 40 MG capsule Take 1 capsule (40 mg total) by mouth daily as needed.   rosuvastatin   (CRESTOR ) 10 MG tablet Take 1 tablet (10 mg total) by mouth daily.   tiZANidine  (ZANAFLEX ) 4 MG tablet Take 1 tablet (4 mg total) by mouth 2 (two) times daily as needed. (Patient not taking: Reported on 11/05/2024)   triamcinolone  (KENALOG ) 0.025 % ointment Apply topically 2 (two) times daily. (Patient not taking: Reported on 11/05/2024)   [DISCONTINUED] Clindamycin  Phos, Once-Daily, (CLINDAGEL ) 1 % GEL Apply 1 Dose topically as needed. (Patient not taking: Reported on 11/05/2024)   [DISCONTINUED] diclofenac  Sodium (VOLTAREN ) 1 % GEL Apply 2 g topically 4 (four) times daily.   [DISCONTINUED] fexofenadine  (ALLEGRA  ALLERGY) 180 MG tablet Take 1 tablet (180 mg total) by mouth daily.   No facility-administered encounter medications on file as of 11/05/2024.    Review of Systems  Review of Systems  Constitutional: Negative.   HENT: Negative.    Cardiovascular: Negative.   Gastrointestinal: Negative.   Allergic/Immunologic: Negative.   Neurological: Negative.   Psychiatric/Behavioral: Negative.       Objective:   BP 123/76   Pulse  96   Wt 233 lb (105.7 kg)   SpO2 100%   BMI 30.74 kg/m   Wt Readings from Last 5 Encounters:  11/05/24 233 lb (105.7 kg)  10/19/24 231 lb (104.8 kg)  09/28/24 231 lb (104.8 kg)  05/26/24 231 lb (104.8 kg)  02/05/24 232 lb 6.4 oz (105.4 kg)     Physical Exam Vitals and nursing note reviewed.  Constitutional:      General: He is not in acute distress.    Appearance: He is well-developed.  Cardiovascular:     Rate and Rhythm: Normal rate and regular rhythm.  Pulmonary:     Effort: Pulmonary effort is normal.     Breath sounds: Normal breath sounds.  Musculoskeletal:     Comments: No bruising or raised area to head noted. No bruising to shoulder or hip. Tender to palpation.   Skin:    General: Skin is warm and dry.  Neurological:     Mental Status: He is alert and oriented to person, place, and time.        Assessment & Plan:   Deafness in  left ear -     Ambulatory referral to ENT  Itch -     hydrOXYzine  Pamoate; Take 1 capsule (50 mg total) by mouth 3 (three) times daily as needed.  Dispense: 270 capsule; Refill: 0  Gastroesophageal reflux disease without esophagitis -     Omeprazole ; Take 1 capsule (40 mg total) by mouth daily as needed.  Dispense: 90 capsule; Refill: 1  Fall, initial encounter  Left hip pain  Post-traumatic headache, not intractable, unspecified chronicity pattern   - will transport to ED for further evaluation after fall.  Other orders -     Diclofenac  Sodium; Apply 2 g topically 4 (four) times daily.  Dispense: 120 g; Refill: 1 -     Clindamycin  Phos (Once-Daily); Apply 1 Dose topically as needed.  Dispense: 75 mL; Refill: 1 -     Fexofenadine  HCl; Take 1 tablet (180 mg total) by mouth daily.  Dispense: 90 tablet; Refill: 2 -     Rosuvastatin  Calcium ; Take 1 tablet (10 mg total) by mouth daily.  Dispense: 90 tablet; Refill: 3 -     Cyclobenzaprine  HCl; Take 1 tablet (10 mg total) by mouth 3 (three) times daily as needed for muscle spasms.  Dispense: 30 tablet; Refill: 0     Return in about 3 months (around 02/03/2025).   Bascom GORMAN Borer, NP 11/05/2024

## 2024-11-05 NOTE — Addendum Note (Signed)
 Addended by: Tehran Rabenold R on: 11/05/2024 01:17 PM   Modules accepted: Orders

## 2024-11-06 ENCOUNTER — Other Ambulatory Visit (HOSPITAL_COMMUNITY): Payer: Self-pay

## 2024-11-06 ENCOUNTER — Other Ambulatory Visit: Payer: Self-pay

## 2024-11-06 MED ORDER — TRIAMCINOLONE ACETONIDE 0.5 % EX OINT
1.0000 | TOPICAL_OINTMENT | Freq: Two times a day (BID) | CUTANEOUS | 0 refills | Status: DC
  Filled 2024-11-06: qty 30, 15d supply, fill #0
  Filled 2024-11-10: qty 30, 30d supply, fill #0

## 2024-11-06 NOTE — Telephone Encounter (Signed)
 Please advise [facility]

## 2024-11-07 ENCOUNTER — Other Ambulatory Visit (HOSPITAL_COMMUNITY): Payer: Self-pay

## 2024-11-09 ENCOUNTER — Other Ambulatory Visit: Payer: Self-pay

## 2024-11-09 ENCOUNTER — Other Ambulatory Visit (HOSPITAL_COMMUNITY): Payer: Self-pay

## 2024-11-10 ENCOUNTER — Other Ambulatory Visit (HOSPITAL_BASED_OUTPATIENT_CLINIC_OR_DEPARTMENT_OTHER): Payer: Self-pay

## 2024-11-10 ENCOUNTER — Other Ambulatory Visit: Payer: Self-pay

## 2024-11-10 ENCOUNTER — Other Ambulatory Visit (HOSPITAL_COMMUNITY): Payer: Self-pay

## 2024-11-11 ENCOUNTER — Encounter: Payer: Self-pay | Admitting: Podiatry

## 2024-11-11 ENCOUNTER — Other Ambulatory Visit (HOSPITAL_COMMUNITY): Payer: Self-pay

## 2024-11-11 ENCOUNTER — Other Ambulatory Visit: Payer: Self-pay

## 2024-11-11 ENCOUNTER — Ambulatory Visit: Admitting: Podiatry

## 2024-11-11 ENCOUNTER — Ambulatory Visit: Payer: Self-pay | Admitting: Nurse Practitioner

## 2024-11-11 VITALS — BP 118/84 | HR 97 | Wt 233.4 lb

## 2024-11-11 VITALS — Ht 73.0 in | Wt 233.0 lb

## 2024-11-11 DIAGNOSIS — M25512 Pain in left shoulder: Secondary | ICD-10-CM | POA: Diagnosis not present

## 2024-11-11 DIAGNOSIS — L03032 Cellulitis of left toe: Secondary | ICD-10-CM | POA: Diagnosis not present

## 2024-11-11 DIAGNOSIS — R6884 Jaw pain: Secondary | ICD-10-CM

## 2024-11-11 DIAGNOSIS — R55 Syncope and collapse: Secondary | ICD-10-CM | POA: Diagnosis not present

## 2024-11-11 MED ORDER — DOXYCYCLINE HYCLATE 100 MG PO TABS
100.0000 mg | ORAL_TABLET | Freq: Two times a day (BID) | ORAL | 0 refills | Status: DC
  Filled 2024-11-11 (×2): qty 20, 10d supply, fill #0

## 2024-11-11 MED ORDER — XIIDRA 5 % OP SOLN
1.0000 [drp] | Freq: Two times a day (BID) | OPHTHALMIC | 12 refills | Status: AC
  Filled 2024-11-11: qty 180, 90d supply, fill #0
  Filled 2024-12-02 – 2024-12-31 (×7): qty 180, 90d supply, fill #1

## 2024-11-11 MED ORDER — CYCLOBENZAPRINE HCL 10 MG PO TABS
10.0000 mg | ORAL_TABLET | Freq: Three times a day (TID) | ORAL | 0 refills | Status: DC | PRN
  Filled 2024-11-11 – 2024-11-16 (×3): qty 90, 30d supply, fill #0

## 2024-11-11 MED ORDER — CYCLOBENZAPRINE HCL 10 MG PO TABS
10.0000 mg | ORAL_TABLET | Freq: Three times a day (TID) | ORAL | 0 refills | Status: DC | PRN
  Filled 2024-11-11: qty 30, 10d supply, fill #0

## 2024-11-11 NOTE — Progress Notes (Signed)
 Subjective   Patient ID: Todd Carroll, male    DOB: 08/28/1976, 48 y.o.   MRN: [id number]  Chief Complaint  Patient presents with   Loss of Consciousness   Hospitalization Follow-up    Pain located in the left shoulder still, jaw making a popping noise when eating    Tinnitus    Right ear, happened a day or so after fall     Referring provider: Oley Bascom RAMAN, NP  Todd Carroll is a 48 y.o. 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  He did recently have a fall in [employer].  He states that he is still having pain to his left shoulder and his jaw is making popping noises.  He is having ringing in his right ear.  Will place referral to neurology and Ortho.  Will place referral to dentist for jaw popping. Denies f/c/s, n/v/d, hemoptysis, PND, leg swelling Denies chest pain or edema     Allergies  Allergen Reactions   Azithromycin  Itching    Immunization History  Administered Date(s) Administered   HPV 9-valent 05/15/2018, 06/13/2018   Hepatitis A 04/02/2013   Hepatitis A, Adult 10/23/2013   Hepatitis B 04/02/2013, 06/02/2013   Hepatitis B, ADULT 01/05/2014   Influenza Split 09/14/2011   Influenza, Seasonal, Injecte, Preservative Fre 02/05/2024, 09/11/2024   Influenza,inj,Quad PF,6+ Mos 10/23/2013, 11/01/2014, 11/15/2015, 08/31/2016, 08/31/2016, 07/24/2017, 09/11/2018, 09/16/2020, 11/02/2021   Meningococcal Mcv4o 03/06/2017, 09/12/2018, 04/16/2024   PFIZER(Purple Top)SARS-COV-2 Vaccination 03/23/2020, 04/12/2020, 11/08/2020   PNEUMOCOCCAL CONJUGATE-20  04/16/2024   PPD Test 03/19/2013   Pfizer(Comirnaty)Fall Seasonal Vaccine 12 years and older 09/11/2024   Pneumococcal Conjugate-13 05/15/2018   Pneumococcal Polysaccharide-23 03/19/2013, 12/31/2018   Td 12/03/2006   Tdap 07/24/2017, 07/14/2023   Vaccinia,smallpox Monkeypox Vaccine Live,pf 08/03/2021   Zoster Recombinant(Shingrix ) 04/16/2024    Tobacco History: Social History   Tobacco Use  Smoking Status Never  Smokeless Tobacco Never   Counseling given: Not Answered   Outpatient Encounter Medications as of 11/11/2024  Medication Sig   albuterol  (VENTOLIN  HFA) 108 (90 Base) MCG/ACT inhaler Inhale 1-2 puffs into the lungs every 6 (six) hours as needed for wheezing or shortness of breath.   bictegravir-emtricitabine -tenofovir  AF (BIKTARVY ) 50-200-25 MG TABS tablet Take 1 tablet by mouth daily. CALL [phone] FOR AN APPOINTMENT   Clindamycin  Phos, Once-Daily, (CLINDAGEL ) 1 % GEL Apply 1 Dose topically as needed.   cycloSPORINE  (RESTASIS ) 0.05 % ophthalmic emulsion Place 1 drop into both eyes 2 (two) times daily.   fexofenadine  (ALLEGRA  ALLERGY) 180 MG tablet Take 1 tablet (180 mg total) by mouth daily.   hydrOXYzine  (VISTARIL ) 50 MG capsule TAKE 1 CAPSULE(50 MG) BY MOUTH THREE TIMES DAILY AS NEEDED   hydrOXYzine  (VISTARIL ) 50 MG capsule Take 1 capsule (50 mg total) by mouth 3 (three) times daily as needed.   multivitamin (ONE-A-DAY MEN'S) TABS tablet Take 1 tablet by mouth daily.   omeprazole  (PRILOSEC) 40 MG capsule Take 1 capsule (40 mg total) by mouth daily as needed.   rosuvastatin  (CRESTOR ) 10 MG tablet Take 1 tablet (10  mg total) by mouth daily.   Sodium Fluoride  (PREVIDENT  5000 BOOSTER PLUS) 1.1 % PSTE USE AS DIRECTED: BRUSH ONTO TEETH FOR NIGHT TIME MOUTH CARE . SPIT OUT EXCESS. DO NOT RINSE.   sodium fluoride  (PREVIDENT  5000 PLUS) 1.1 % CREA dental cream Use a pea sized amount of this toothpaste in the evening   triamcinolone  ointment (KENALOG ) 0.5 % Apply 1 Application  topically 2 (two) times daily.   valACYclovir  (VALTREX ) 500 MG tablet Take 1 tablet (500 mg total) by mouth daily.   [DISCONTINUED] cyclobenzaprine  (FLEXERIL ) 10 MG tablet Take 1 tablet (10 mg total) by mouth 3 (three) times daily as needed for muscle spasms.   [DISCONTINUED] diclofenac  Sodium (VOLTAREN ) 1 % GEL Apply 2 g topically 4 (four) times daily.   [DISCONTINUED] Diclofenac  Sodium CR 100 MG 24 hr tablet Take 1 tablet (100 mg total) by mouth daily.   [DISCONTINUED] doxycycline  (VIBRA -TABS) 100 MG tablet Take 1 tablet (100 mg total) by mouth 2 (two) times daily.   [DISCONTINUED] meloxicam  (MOBIC ) 7.5 MG tablet Take 1 tablet (7.5 mg total) by mouth daily as needed for pain.   clindamycin  (CLINDAGEL ) 1 % gel Apply topically 2 (two) times daily. (Patient not taking: Reported on 11/17/2024)   cyclobenzaprine  (FLEXERIL ) 10 MG tablet Take 1 tablet (10 mg total) by mouth 3 (three) times daily as needed for muscle spasms.   Dentifrices (LUMINEUX WHITENING TOOTHPASTE) PSTE Place 1 Application onto teeth daily. (Patient not taking: Reported on 11/17/2024)   lidocaine  (LIDODERM ) 5 % Place 1 patch onto the skin daily. Remove & Discard patch within 12 hours or as directed by MD (Patient not taking: Reported on 11/17/2024)   moxifloxacin  (VIGAMOX ) 0.5 % ophthalmic solution Place 1 drop into both eyes 3 (three) times daily. (Patient not taking: Reported on 11/17/2024)   tiZANidine  (ZANAFLEX ) 4 MG tablet Take 1 tablet (4 mg total) by mouth 2 (two) times daily as needed. (Patient not taking: Reported on 11/17/2024)   triamcinolone  (KENALOG ) 0.025 % ointment Apply topically 2 (two) times daily. (Patient not taking: Reported on 11/17/2024)   [DISCONTINUED] cyclobenzaprine  (FLEXERIL ) 10 MG tablet Take 1 tablet (10 mg total) by mouth 3 (three) times daily as needed for muscle spasms.   No facility-administered encounter medications on file as of 11/11/2024.    Review of Systems  Review of Systems   Constitutional: Negative.   HENT: Negative.    Gastrointestinal: Negative.   Allergic/Immunologic: Negative.   Psychiatric/Behavioral: Negative.       Objective:   BP 118/84 (BP Location: Left Arm, Patient Position: Sitting, Cuff Size: Large)   Pulse 97   Wt 233 lb 6.4 oz (105.9 kg)   SpO2 97%   BMI 30.79 kg/m   Wt Readings from Last 5 Encounters:  11/17/24 235 lb (106.6 kg)  11/11/24 233 lb 6.4 oz (105.9 kg)  11/11/24 233 lb (105.7 kg)  11/05/24 233 lb (105.7 kg)  10/19/24 231 lb (104.8 kg)     Physical Exam Vitals and nursing note reviewed.  Constitutional:      General: He is not in acute distress.    Appearance: He is well-developed.  Cardiovascular:     Rate and Rhythm: Normal rate and regular rhythm.  Pulmonary:     Effort: Pulmonary effort is normal.     Breath sounds: Normal breath sounds.  Musculoskeletal:     Right shoulder: Tenderness present. Decreased range of motion.  Skin:    General: Skin is warm and dry.  Neurological:  Mental Status: He is alert and oriented to person, place, and time.       Assessment & Plan:   Acute pain of left shoulder -     Ambulatory referral to Dentistry -     Ambulatory referral to Orthopedic Surgery -     Cyclobenzaprine  HCl; Take 1 tablet (10 mg total) by mouth 3 (three) times daily as needed for muscle spasms.  Dispense: 90 tablet; Refill: 0  Jaw pain -     Ambulatory referral to Dentistry  Syncope, unspecified syncope type -     CBC -     Comprehensive metabolic panel with GFR -     Ambulatory referral to Neurology     Return in about 3 months (around 02/09/2025).   Bascom GORMAN Borer, NP 11/19/2024
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
## 2024-11-11 NOTE — Patient Instructions (Signed)
     Dental Resources  [facility] Dental Department   [address]   Cleaning: $5 [place], [place] [address]  Xray: $5 [phone]  Call to get on waiting list   Dr. Royston Cornea McMasters/ Dr. Bonnielee Buttery [address]    Xray: $85 each [place], [place] [address]  Extraction $200 [phone]   Dr. Lovena Rubinstein Turner/ Dr. Glorine Laroche  [address]    Exam, Cleaning, Xray: $262 [place]   Extraction: $324-$401 [phone]  Dr. Michiel Ala   [address]   Extraction: $300 per tooth  [place] [address]  [phone]   Dr. Crissie Dome  [address]    Cleaning: $300  [place], [place] [address]  Extraction: [phone]  [place]   Dr. Alessandra Ancona  [address]    Exam: $85 [place], [place]    Extraction: $120 and up  [phone]    *full list of prices available*  [facility] [address]  Exam: $94 [place], [place]    Exam with Xrays: $380  [phone]    Xrays: $68 and up       Cleaning: $101       Extraction $190 and up  [facility]    [address]    Cleaning + Xray: $344 [place]    Extraction: pt must be seen first for price [phone]  [facility] [address]   Emergency Exam: $65 [place] [address]   Cleaning & Exam: $150  [phone]    Extractions: simple $180; surgical $250      Fillings: [id number]

## 2024-11-12 ENCOUNTER — Other Ambulatory Visit: Payer: Self-pay

## 2024-11-12 ENCOUNTER — Other Ambulatory Visit (HOSPITAL_COMMUNITY): Payer: Self-pay

## 2024-11-12 ENCOUNTER — Ambulatory Visit: Payer: Self-pay | Admitting: Nurse Practitioner

## 2024-11-12 LAB — COMPREHENSIVE METABOLIC PANEL WITH GFR
ALT: 27 IU/L (ref 0–44)
AST: 21 IU/L (ref 0–40)
Albumin: 4.8 g/dL (ref 4.1–5.1)
Alkaline Phosphatase: 88 IU/L (ref 47–123)
BUN/Creatinine Ratio: 11 (ref 9–20)
BUN: 15 mg/dL (ref 6–24)
Bilirubin Total: 0.3 mg/dL (ref 0.0–1.2)
CO2: 21 mmol/L (ref 20–29)
Calcium: 9.8 mg/dL (ref 8.7–10.2)
Chloride: 102 mmol/L (ref 96–106)
Creatinine, Ser: 1.33 mg/dL — ABNORMAL HIGH (ref 0.76–1.27)
Globulin, Total: 3.2 g/dL (ref 1.5–4.5)
Glucose: 95 mg/dL (ref 70–99)
Potassium: 4.3 mmol/L (ref 3.5–5.2)
Sodium: 139 mmol/L (ref 134–144)
Total Protein: 8 g/dL (ref 6.0–8.5)
eGFR: 66 mL/min/1.73 (ref >59)

## 2024-11-12 LAB — CBC
Hematocrit: 38.7 % (ref 37.5–51.0)
Hemoglobin: 12.7 g/dL — ABNORMAL LOW (ref 13.0–17.7)
MCH: 31 pg (ref 26.6–33.0)
MCHC: 32.8 g/dL (ref 31.5–35.7)
MCV: 94 fL (ref 79–97)
Platelets: 390 x10E3/uL (ref 150–450)
RBC: 4.1 x10E6/uL — ABNORMAL LOW (ref 4.14–5.80)
RDW: 14.1 % (ref 11.6–15.4)
WBC: 7.6 x10E3/uL (ref 3.4–10.8)

## 2024-11-13 ENCOUNTER — Telehealth: Payer: Self-pay | Admitting: Lab

## 2024-11-13 NOTE — Telephone Encounter (Signed)
 Patient calling states pain is an 8 and also would like another script for antibiotic lost the last bottle also would like higher dose please advise.

## 2024-11-14 ENCOUNTER — Other Ambulatory Visit (HOSPITAL_COMMUNITY): Payer: Self-pay

## 2024-11-14 ENCOUNTER — Other Ambulatory Visit: Payer: Self-pay | Admitting: Nurse Practitioner

## 2024-11-15 ENCOUNTER — Other Ambulatory Visit (HOSPITAL_COMMUNITY): Payer: Self-pay

## 2024-11-16 ENCOUNTER — Other Ambulatory Visit: Payer: Self-pay | Admitting: Podiatry

## 2024-11-16 ENCOUNTER — Telehealth: Payer: Self-pay

## 2024-11-16 ENCOUNTER — Other Ambulatory Visit (HOSPITAL_COMMUNITY): Payer: Self-pay

## 2024-11-16 ENCOUNTER — Other Ambulatory Visit: Payer: Self-pay

## 2024-11-16 MED ORDER — DOXYCYCLINE HYCLATE 100 MG PO TABS
100.0000 mg | ORAL_TABLET | Freq: Two times a day (BID) | ORAL | 0 refills | Status: AC
  Filled 2024-11-16 – 2024-11-19 (×5): qty 20, 10d supply, fill #0
  Filled ????-??-?? (×2): fill #0

## 2024-11-16 NOTE — Telephone Encounter (Signed)
 Patient's second time calling for a refill on antibiotics( lost medicine) and requesting a higher dosage. Patient is also requesting pain medication for total nail avulsion.

## 2024-11-17 ENCOUNTER — Other Ambulatory Visit: Payer: Self-pay

## 2024-11-17 ENCOUNTER — Other Ambulatory Visit: Payer: Self-pay | Admitting: Podiatry

## 2024-11-17 ENCOUNTER — Other Ambulatory Visit (HOSPITAL_COMMUNITY): Payer: Self-pay

## 2024-11-17 ENCOUNTER — Encounter (INDEPENDENT_AMBULATORY_CARE_PROVIDER_SITE_OTHER): Payer: Self-pay

## 2024-11-17 ENCOUNTER — Ambulatory Visit (INDEPENDENT_AMBULATORY_CARE_PROVIDER_SITE_OTHER)

## 2024-11-17 VITALS — BP 102/72 | HR 85 | Temp 98.0°F | Wt 235.0 lb

## 2024-11-17 DIAGNOSIS — J3489 Other specified disorders of nose and nasal sinuses: Secondary | ICD-10-CM | POA: Diagnosis not present

## 2024-11-17 DIAGNOSIS — M26609 Unspecified temporomandibular joint disorder, unspecified side: Secondary | ICD-10-CM

## 2024-11-17 DIAGNOSIS — W19XXXD Unspecified fall, subsequent encounter: Secondary | ICD-10-CM

## 2024-11-17 DIAGNOSIS — R42 Dizziness and giddiness: Secondary | ICD-10-CM

## 2024-11-17 DIAGNOSIS — W19XXXA Unspecified fall, initial encounter: Secondary | ICD-10-CM | POA: Diagnosis not present

## 2024-11-17 DIAGNOSIS — H9202 Otalgia, left ear: Secondary | ICD-10-CM | POA: Diagnosis not present

## 2024-11-17 DIAGNOSIS — H9192 Unspecified hearing loss, left ear: Secondary | ICD-10-CM

## 2024-11-17 DIAGNOSIS — R1314 Dysphagia, pharyngoesophageal phase: Secondary | ICD-10-CM | POA: Diagnosis not present

## 2024-11-17 DIAGNOSIS — H9311 Tinnitus, right ear: Secondary | ICD-10-CM | POA: Diagnosis not present

## 2024-11-17 MED ORDER — TRAMADOL HCL 50 MG PO TABS
50.0000 mg | ORAL_TABLET | Freq: Three times a day (TID) | ORAL | 0 refills | Status: AC | PRN
  Filled 2024-11-17: qty 15, 5d supply, fill #0
  Filled 2024-11-17 (×2): qty 20, 7d supply, fill #0
  Filled 2024-11-19: qty 15, 5d supply, fill #0

## 2024-11-17 MED ORDER — MUPIROCIN 2 % EX OINT
1.0000 | TOPICAL_OINTMENT | Freq: Two times a day (BID) | CUTANEOUS | 0 refills | Status: DC
  Filled 2024-11-17: qty 22, 11d supply, fill #0
  Filled 2024-11-19: qty 22, 22d supply, fill #0

## 2024-11-17 MED ORDER — SALINE SPRAY 0.65 % NA SOLN
2.0000 | Freq: Three times a day (TID) | NASAL | 0 refills | Status: DC
  Filled 2024-11-17: qty 30, 100d supply, fill #0
  Filled 2024-11-19: qty 44, 90d supply, fill #0
  Filled 2024-11-26 – 2024-12-09 (×3): qty 44, 147d supply, fill #0
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
## 2024-11-17 NOTE — Telephone Encounter (Signed)
 Please advise [facility]

## 2024-11-17 NOTE — Progress Notes (Signed)
 PRN total nail avulsion

## 2024-11-18 ENCOUNTER — Other Ambulatory Visit (HOSPITAL_COMMUNITY): Payer: Self-pay

## 2024-11-18 ENCOUNTER — Other Ambulatory Visit: Payer: Self-pay

## 2024-11-18 DIAGNOSIS — R1310 Dysphagia, unspecified: Secondary | ICD-10-CM

## 2024-11-18 MED ORDER — DICLOFENAC SODIUM 1 % EX GEL
2.0000 g | Freq: Four times a day (QID) | CUTANEOUS | 1 refills | Status: AC
  Filled 2024-11-18 – 2024-12-09 (×4): qty 100, 8d supply, fill #0
  Filled 2024-12-15: qty 100, 14d supply, fill #0
  Filled 2024-12-25 – 2025-01-05 (×2): qty 100, 14d supply, fill #1

## 2024-11-19 ENCOUNTER — Ambulatory Visit: Admitting: Orthopaedic Surgery

## 2024-11-19 ENCOUNTER — Other Ambulatory Visit (HOSPITAL_COMMUNITY): Payer: Self-pay

## 2024-11-19 ENCOUNTER — Other Ambulatory Visit (INDEPENDENT_AMBULATORY_CARE_PROVIDER_SITE_OTHER): Payer: Self-pay

## 2024-11-19 ENCOUNTER — Other Ambulatory Visit: Payer: Self-pay

## 2024-11-19 DIAGNOSIS — M25512 Pain in left shoulder: Secondary | ICD-10-CM | POA: Diagnosis not present

## 2024-11-19 MED ORDER — MELOXICAM 15 MG PO TABS
15.0000 mg | ORAL_TABLET | Freq: Every day | ORAL | 2 refills | Status: DC
  Filled 2024-11-19: qty 14, 14d supply, fill #0
  Filled 2024-11-19: qty 14, 14d supply, fill #1
  Filled 2024-11-19: qty 14, 14d supply, fill #0

## 2024-11-19 NOTE — Progress Notes (Signed)
 Office Visit Note   Patient: Todd Carroll           Date of Birth: Jan 02, 1976           MRN: [id number] Visit Date: 11/19/2024              Requested by: Oley Bascom RAMAN, NP [phone] [address],  [place] [address] PCP: Oley Bascom RAMAN, NP   Assessment & Plan: Visit Diagnoses:  1. Acute pain of left shoulder     Plan: History of Present Illness Todd Carroll is a 48 year old male who presents with left shoulder pain following a fall.  He reports diffuse left shoulder pain since a fall a few weeks ago in which he landed on his left side. The pain is worse at night and often wakes him from sleep. He feels it has slightly worsened since onset. He is taking Flexeril  without relief and is not using anti-inflammatory medications, though he previously took diclofenac  and meloxicam  from old prescriptions.  Physical Exam MUSCULOSKELETAL: Normal range of motion and rotator cuff strength in left shoulder. Tenderness in left AC joint.  Results Radiology Left shoulder X-ray (11/2024): No acute abnormality, no fracture or dislocation, osteophyte formation (Independently interpreted)  Assessment and Plan Left shoulder pain with impingement, bursitis, and AC joint tenderness Pain likely due to bone spur causing impingement and bursitis. AC joint tenderness present possible type I AC separation. No acute abnormalities on X-ray. - Prescribed anti-inflammatory medication. - Provided home exercises for shoulder strengthening. - Consider subacromial steroid injection if pain persists.  Follow-Up Instructions: No follow-ups on file.   Orders:  No orders of the defined types were placed in this encounter.  Meds ordered this encounter  Medications   meloxicam  (MOBIC ) 15 MG tablet    Sig: Take 1 tablet (15 mg total) by mouth daily.    Dispense:  14 tablet    Refill:  2      Procedures: No procedures performed   Clinical Data: No additional  findings.   Subjective: Chief Complaint  Patient presents with   Left Shoulder - Pain    HPI  Review of Systems  Constitutional: Negative.   HENT: Negative.    Eyes: Negative.   Respiratory: Negative.    Cardiovascular: Negative.   Gastrointestinal: Negative.   Endocrine: Negative.   Genitourinary: Negative.   Skin: Negative.   Allergic/Immunologic: Negative.   Neurological: Negative.   Hematological: Negative.   Psychiatric/Behavioral: Negative.    All other systems reviewed and are negative.    Objective: Vital Signs: There were no vitals taken for this visit.  Physical Exam Vitals and nursing note reviewed.  Constitutional:      Appearance: He is well-developed.  HENT:     Head: Normocephalic and atraumatic.  Eyes:     Pupils: Pupils are equal, round, and reactive to light.  Pulmonary:     Effort: Pulmonary effort is normal.  Abdominal:     Palpations: Abdomen is soft.  Musculoskeletal:        General: Normal range of motion.     Cervical back: Neck supple.  Skin:    General: Skin is warm.  Neurological:     Mental Status: He is alert and oriented to person, place, and time.  Psychiatric:        Behavior: Behavior normal.        Thought Content: Thought content normal.        Judgment: Judgment normal.  Ortho Exam  Specialty Comments:  No specialty comments available.  Imaging: No results found.   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   Diarrhea 08/10/2021   Streptococcus A carrier or suspected carrier 07/24/2019   Underinsured 07/24/2019   Dysphagia 10/23/2018   Constipation 10/23/2018   Hematospermia 11/01/2014   Scalp pruritus 07/25/2014   Itch 05/25/2014   Rash and nonspecific skin eruption 01/05/2014   Folliculitis 07/13/2013   Pain, dental 07/13/2013    Dysuria 04/29/2013   Human immunodeficiency virus (HIV) disease (HCC) 02/27/2013   GERD 05/04/2008   Allergic rhinitis 08/27/2007   Past Medical History:  Diagnosis Date   ALLERGIC RHINITIS 08/27/2007   Allergic rhinitis 08/27/2007   Qualifier: Diagnosis of   By: Lilton CMA, Jenetta      IMO SNOMED Dx Update Oct 2024     Allergy    GERD 05/04/2008   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   Human immunodeficiency virus (HIV) disease (HCC) 02/27/2013   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    Family History  Problem Relation Age of Onset   Multiple myeloma Mother    Diabetes Mother    Colon cancer Neg Hx    Esophageal cancer Neg Hx    Stomach cancer Neg Hx    Pancreatic cancer Neg Hx    Liver disease Neg Hx    Rectal cancer Neg Hx     Past Surgical History:  Procedure Laterality Date   APPENDECTOMY     Social History   Occupational History   Not on file  Tobacco Use   Smoking status: Never   Smokeless tobacco: Never  Vaping Use   Vaping status: Never Used  Substance and Sexual Activity   Alcohol use: Yes    Comment: occasionally   Drug use: No   Sexual activity: Not Currently    Partners: Female, Male    Birth control/protection: Condom    Comment: condoms given

## 2024-11-20 ENCOUNTER — Other Ambulatory Visit (HOSPITAL_COMMUNITY): Payer: Self-pay

## 2024-11-20 ENCOUNTER — Encounter: Payer: Self-pay | Admitting: Pharmacist

## 2024-11-20 ENCOUNTER — Other Ambulatory Visit: Payer: Self-pay | Admitting: Orthopaedic Surgery

## 2024-11-20 ENCOUNTER — Ambulatory Visit: Admitting: Physical Therapy

## 2024-11-20 ENCOUNTER — Encounter (INDEPENDENT_AMBULATORY_CARE_PROVIDER_SITE_OTHER): Payer: Self-pay

## 2024-11-20 ENCOUNTER — Other Ambulatory Visit: Payer: Self-pay

## 2024-11-20 MED ORDER — MELOXICAM 15 MG PO TABS
15.0000 mg | ORAL_TABLET | Freq: Every day | ORAL | 2 refills | Status: AC
  Filled 2024-11-20 – 2024-11-29 (×4): qty 14, 14d supply, fill #0
  Filled 2024-12-01 – 2024-12-10 (×4): qty 14, 14d supply, fill #1
  Filled 2024-12-27 – 2025-01-01 (×3): qty 14, 14d supply, fill #2

## 2024-11-23 ENCOUNTER — Other Ambulatory Visit (HOSPITAL_COMMUNITY): Payer: Self-pay

## 2024-11-23 ENCOUNTER — Other Ambulatory Visit (INDEPENDENT_AMBULATORY_CARE_PROVIDER_SITE_OTHER): Payer: Self-pay | Admitting: Nurse Practitioner

## 2024-11-24 ENCOUNTER — Other Ambulatory Visit: Payer: Self-pay

## 2024-11-25 ENCOUNTER — Other Ambulatory Visit: Payer: Self-pay

## 2024-11-25 ENCOUNTER — Other Ambulatory Visit (HOSPITAL_COMMUNITY): Payer: Self-pay

## 2024-11-27 ENCOUNTER — Other Ambulatory Visit: Payer: Self-pay

## 2024-11-27 ENCOUNTER — Other Ambulatory Visit (INDEPENDENT_AMBULATORY_CARE_PROVIDER_SITE_OTHER): Payer: Self-pay

## 2024-11-27 ENCOUNTER — Other Ambulatory Visit (HOSPITAL_COMMUNITY): Payer: Self-pay

## 2024-11-27 ENCOUNTER — Other Ambulatory Visit: Payer: Self-pay | Admitting: Internal Medicine

## 2024-11-27 ENCOUNTER — Other Ambulatory Visit (HOSPITAL_BASED_OUTPATIENT_CLINIC_OR_DEPARTMENT_OTHER): Payer: Self-pay

## 2024-11-27 ENCOUNTER — Telehealth: Payer: Self-pay | Admitting: Nurse Practitioner

## 2024-11-27 ENCOUNTER — Other Ambulatory Visit: Payer: Self-pay | Admitting: Nurse Practitioner

## 2024-11-27 DIAGNOSIS — L299 Pruritus, unspecified: Secondary | ICD-10-CM

## 2024-11-27 MED ORDER — MUPIROCIN 2 % EX OINT
1.0000 | TOPICAL_OINTMENT | Freq: Two times a day (BID) | CUTANEOUS | 0 refills | Status: DC
  Filled 2024-11-27 – 2024-12-06 (×3): qty 22, 11d supply, fill #0

## 2024-11-27 MED ORDER — TRIAMCINOLONE ACETONIDE 0.5 % EX OINT
1.0000 | TOPICAL_OINTMENT | Freq: Two times a day (BID) | CUTANEOUS | 0 refills | Status: AC
  Filled 2024-11-27 – 2024-12-12 (×3): qty 30, 15d supply, fill #0

## 2024-11-27 NOTE — Telephone Encounter (Signed)
 Patient called and verbalized his neurology referral was scheduled on 03/12/25 and wondering if the clinical team could locate another location with a sooner appointment date & time. Please see note below about referring to another location.    Copied from CRM #[id number]. Topic: Referral - Question >> Nov 25, 2024 10:31 AM Tinnie C wrote: Reason for CRM: Pt says he called the location of his referral and they were not able to get him in until 4/10. Pt is wondering if he can be referred to another location so he can try to be seen sooner. He says he does not use mychart so would like a call at [phone] once it has been rerouted.

## 2024-11-27 NOTE — Progress Notes (Signed)
 Specialty Pharmacy Refill Coordination Note  Todd Carroll is a 48 y.o. male contacted today regarding refills of specialty medication(s) Bictegravir-Emtricitab-Tenofov (Biktarvy )   Patient requested Delivery   Delivery date: 12/02/24   Verified address: [address] [place] [address]   Medication will be filled on: 12/01/24

## 2024-11-27 NOTE — Telephone Encounter (Signed)
 Ordered by PCP today.   Bobbyjoe Pabst, BSN, RN

## 2024-11-28 ENCOUNTER — Other Ambulatory Visit: Payer: Self-pay

## 2024-11-28 ENCOUNTER — Other Ambulatory Visit (HOSPITAL_COMMUNITY): Payer: Self-pay

## 2024-11-29 ENCOUNTER — Other Ambulatory Visit (HOSPITAL_COMMUNITY): Payer: Self-pay

## 2024-11-30 ENCOUNTER — Other Ambulatory Visit: Payer: Self-pay

## 2024-11-30 ENCOUNTER — Other Ambulatory Visit (HOSPITAL_COMMUNITY): Payer: Self-pay

## 2024-11-30 NOTE — Telephone Encounter (Signed)
 Pt was advised to check for cancellation. Kh

## 2024-12-01 ENCOUNTER — Other Ambulatory Visit: Payer: Self-pay

## 2024-12-02 ENCOUNTER — Ambulatory Visit: Admitting: Podiatry

## 2024-12-02 ENCOUNTER — Other Ambulatory Visit: Payer: Self-pay | Admitting: Nurse Practitioner

## 2024-12-02 ENCOUNTER — Other Ambulatory Visit: Payer: Self-pay

## 2024-12-02 DIAGNOSIS — M25512 Pain in left shoulder: Secondary | ICD-10-CM

## 2024-12-04 ENCOUNTER — Other Ambulatory Visit: Payer: Self-pay

## 2024-12-04 ENCOUNTER — Encounter: Payer: Self-pay | Admitting: Pharmacist

## 2024-12-04 ENCOUNTER — Other Ambulatory Visit (HOSPITAL_COMMUNITY): Payer: Self-pay

## 2024-12-04 MED ORDER — CYCLOBENZAPRINE HCL 10 MG PO TABS
10.0000 mg | ORAL_TABLET | Freq: Three times a day (TID) | ORAL | 0 refills | Status: AC | PRN
  Filled 2024-12-04 – 2024-12-14 (×3): qty 90, 30d supply, fill #0

## 2024-12-04 NOTE — Telephone Encounter (Signed)
cyclobenzaprine (FLEXERIL) 10 MG tablet

## 2024-12-04 NOTE — Telephone Encounter (Signed)
 Please advise

## 2024-12-07 ENCOUNTER — Other Ambulatory Visit (HOSPITAL_COMMUNITY): Payer: Self-pay

## 2024-12-07 ENCOUNTER — Ambulatory Visit: Payer: Self-pay | Admitting: Nurse Practitioner

## 2024-12-07 ENCOUNTER — Encounter: Payer: Self-pay | Admitting: Nurse Practitioner

## 2024-12-07 ENCOUNTER — Ambulatory Visit (INDEPENDENT_AMBULATORY_CARE_PROVIDER_SITE_OTHER): Admitting: Podiatry

## 2024-12-07 ENCOUNTER — Encounter: Payer: Self-pay | Admitting: Podiatry

## 2024-12-07 ENCOUNTER — Other Ambulatory Visit: Payer: Self-pay

## 2024-12-07 VITALS — BP 101/72 | HR 99 | Temp 97.1°F | Wt 235.0 lb

## 2024-12-07 DIAGNOSIS — L299 Pruritus, unspecified: Secondary | ICD-10-CM

## 2024-12-07 DIAGNOSIS — R519 Headache, unspecified: Secondary | ICD-10-CM | POA: Diagnosis not present

## 2024-12-07 DIAGNOSIS — L03032 Cellulitis of left toe: Secondary | ICD-10-CM | POA: Diagnosis not present

## 2024-12-07 DIAGNOSIS — G8929 Other chronic pain: Secondary | ICD-10-CM

## 2024-12-07 DIAGNOSIS — K219 Gastro-esophageal reflux disease without esophagitis: Secondary | ICD-10-CM | POA: Diagnosis not present

## 2024-12-07 MED ORDER — OMEPRAZOLE 40 MG PO CPDR
40.0000 mg | DELAYED_RELEASE_CAPSULE | Freq: Every day | ORAL | 1 refills | Status: DC | PRN
  Filled 2024-12-07: qty 90, 90d supply, fill #0
  Filled 2024-12-15: qty 90, 90d supply, fill #1

## 2024-12-07 MED ORDER — HYDROXYZINE PAMOATE 50 MG PO CAPS
50.0000 mg | ORAL_CAPSULE | Freq: Three times a day (TID) | ORAL | 2 refills | Status: AC | PRN
  Filled 2024-12-07 – 2024-12-15 (×2): qty 270, 90d supply, fill #0
  Filled 2025-01-01: qty 90, 30d supply, fill #0

## 2024-12-07 MED ORDER — ROSUVASTATIN CALCIUM 10 MG PO TABS
10.0000 mg | ORAL_TABLET | Freq: Every day | ORAL | 3 refills | Status: AC
  Filled 2024-12-07 – 2025-01-05 (×5): qty 90, 90d supply, fill #0

## 2024-12-07 MED ORDER — CLINDAMYCIN PHOS (ONCE-DAILY) 1 % EX GEL
1.0000 | CUTANEOUS | 1 refills | Status: DC | PRN
  Filled 2024-12-07: qty 75, 30d supply, fill #0

## 2024-12-07 NOTE — Progress Notes (Signed)
" ° °  Chief Complaint  Patient presents with   Ingrown Toenail    INGROWN PROCEDURE F/U. 0 pain. Non diabetic. Still feels uncomfortable with closed toe shoe. Wearing surgical shoe.     Subjective: 49 y.o. male presents today status post total temporary nail avulsion of the left hallux nail plate performed on 11/11/2024.  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  Skin is warm, dry and supple.  Nailbed is healing nicely.  There is no inflammation or erythema or tenderness  Assessment: #1 s/p partial permanent nail matrixectomy medial border left great toe.  09/28/2024  #2 s/p total temporary nail avulsion left hallux nail plate.  11/11/2024   Plan of care: -Patient doing very well.  The nail avulsion area is clear of any cellulitis or indication of inflammation. -Completed the doxycycline  prescribed on 11/11/2024 without any complication -Full activity no restrictions.  Discontinue postop shoe -Return to clinic PRN   Thresa EMERSON Sar, DPM [facility]  Dr. Thresa EMERSON Sar, DPM    [address] [place], [place] [address]                Office [phone]  Fax [phone]     "

## 2024-12-07 NOTE — Progress Notes (Signed)
 "  Subjective   Patient ID: Todd Carroll, male    DOB: 10-24-1976, 49 y.o.   MRN: [id number]  Chief Complaint  Patient presents with   Shoulder Pain    3 week f/u. Still having pain in shoulder along with HA's and pain chin. Ringing in right ear.     Referring provider: Oley Bascom RAMAN, NP  Todd Carroll is a 49 y.o. 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  He has been having pain in his left shoulder and his jaw.  He has been having headaches.  He has been having ringing to his right ear.  He does have an upcoming appointment with the ENT and has followed with orthopedics.  He tried to get an appointment with neuro for ongoing headaches but the appointment could not be scheduled until this upcoming March or April.  We will place a new referral with a different neurology office today. Denies f/c/s, n/v/d, hemoptysis, PND, leg swelling Denies chest pain or edema    Allergies[1]  Immunization History  Administered Date(s) Administered   HPV 9-valent 05/15/2018, 06/13/2018   Hepatitis A 04/02/2013   Hepatitis A, Adult 10/23/2013   Hepatitis B 04/02/2013, 06/02/2013   Hepatitis B, ADULT 01/05/2014   Influenza Split 09/14/2011   Influenza, Seasonal, Injecte, Preservative Fre 02/05/2024, 09/11/2024   Influenza,inj,Quad PF,6+ Mos 10/23/2013, 11/01/2014, 11/15/2015, 08/31/2016, 08/31/2016, 07/24/2017, 09/11/2018, 09/16/2020, 11/02/2021   Meningococcal Mcv4o 03/06/2017, 09/12/2018, 04/16/2024   PFIZER(Purple Top)SARS-COV-2  Vaccination 03/23/2020, 04/12/2020, 11/08/2020   PNEUMOCOCCAL CONJUGATE-20 04/16/2024   PPD Test 03/19/2013   Pfizer(Comirnaty)Fall Seasonal Vaccine 12 years and older 09/11/2024   Pneumococcal Conjugate-13 05/15/2018   Pneumococcal Polysaccharide-23 03/19/2013, 12/31/2018   Td 12/03/2006   Tdap 07/24/2017, 07/14/2023   Vaccinia,smallpox Monkeypox Vaccine Live,pf 08/03/2021   Zoster Recombinant(Shingrix ) 04/16/2024    Tobacco History: Tobacco Use History[2] Counseling given: Not Answered   Outpatient Encounter Medications as of 12/07/2024  Medication Sig   albuterol  (VENTOLIN  HFA) 108 (90 Base) MCG/ACT inhaler Inhale 1-2 puffs into the lungs every 6 (six) hours as needed for wheezing or shortness of breath.   bictegravir-emtricitabine -tenofovir  AF (BIKTARVY ) 50-200-25 MG TABS tablet Take 1 tablet by mouth daily. CALL [phone] FOR AN APPOINTMENT   cyclobenzaprine  (FLEXERIL ) 10 MG tablet Take 1 tablet (10 mg total) by mouth 3 (three) times daily as needed for muscle spasms.   cycloSPORINE  (RESTASIS ) 0.05 % ophthalmic emulsion Place 1 drop into both eyes 2 (two) times daily.   Dentifrices (LUMINEUX WHITENING TOOTHPASTE) PSTE Place 1 Application onto teeth daily.   diclofenac  Sodium (VOLTAREN ) 1 % GEL Apply 2 grams topically 4 (four) times daily.   doxycycline  (VIBRA -TABS) 100 MG tablet Take 1 tablet (100 mg total) by mouth 2 (two) times daily.   hydrOXYzine  (VISTARIL ) 50 MG capsule TAKE 1 CAPSULE(50 MG) BY MOUTH THREE TIMES DAILY AS NEEDED   Lifitegrast  (XIIDRA ) 5 % SOLN Place 1 drop into both eyes 2 (two) times daily.   meloxicam  (MOBIC ) 15 MG tablet Take 1 tablet (  15 mg total) by mouth daily.   moxifloxacin  (VIGAMOX ) 0.5 % ophthalmic solution Place 1 drop into both eyes 3 (three) times daily.   multivitamin (ONE-A-DAY MEN'S) TABS tablet Take 1 tablet by mouth daily.   mupirocin  ointment (BACTROBAN ) 2 % Apply 1 Application topically 2 (two) times daily. Apply small amount inside nose    sodium chloride  (OCEAN) 0.65 % SOLN nasal spray Place 2 sprays into both nostrils in the morning, at noon, and at bedtime.   Sodium Fluoride  (PREVIDENT  5000 BOOSTER PLUS) 1.1 % PSTE USE AS DIRECTED: BRUSH ONTO TEETH FOR NIGHT TIME MOUTH CARE . SPIT OUT EXCESS. DO NOT RINSE.   sodium fluoride  (PREVIDENT  5000 PLUS) 1.1 % CREA dental cream Use a pea sized amount of this toothpaste in the evening   tiZANidine  (ZANAFLEX ) 4 MG tablet Take 1 tablet (4 mg total) by mouth 2 (two) times daily as needed.   triamcinolone  (KENALOG ) 0.025 % ointment Apply topically 2 (two) times daily.   triamcinolone  ointment (KENALOG ) 0.5 % Apply 1 Application topically 2 (two) times daily.   valACYclovir  (VALTREX ) 500 MG tablet Take 1 tablet (500 mg total) by mouth daily.   [DISCONTINUED] hydrOXYzine  (VISTARIL ) 50 MG capsule Take 1 capsule (50 mg total) by mouth 3 (three) times daily as needed.   [DISCONTINUED] omeprazole  (PRILOSEC) 40 MG capsule Take 1 capsule (40 mg total) by mouth daily as needed.   [DISCONTINUED] rosuvastatin  (CRESTOR ) 10 MG tablet Take 1 tablet (10 mg total) by mouth daily.   Clindamycin  Phos, Once-Daily, (CLINDAGEL ) 1 % GEL Apply 1 Dose topically as needed.   fexofenadine  (ALLEGRA  ALLERGY) 180 MG tablet Take 1 tablet (180 mg total) by mouth daily. (Patient not taking: Reported on 12/07/2024)   hydrOXYzine  (VISTARIL ) 50 MG capsule Take 1 capsule (50 mg total) by mouth 3 (three) times daily as needed.   lidocaine  (LIDODERM ) 5 % Place 1 patch onto the skin daily. Remove & Discard patch within 12 hours or as directed by MD (Patient not taking: Reported on 12/07/2024)   omeprazole  (PRILOSEC) 40 MG capsule Take 1 capsule (40 mg total) by mouth daily as needed.   rosuvastatin  (CRESTOR ) 10 MG tablet Take 1 tablet (10 mg total) by mouth daily.   [DISCONTINUED] clindamycin  (CLINDAGEL ) 1 % gel Apply topically 2 (two) times daily. (Patient not taking: Reported on 12/07/2024)   [DISCONTINUED] Clindamycin  Phos,  Once-Daily, (CLINDAGEL ) 1 % GEL Apply 1 Dose topically as needed. (Patient not taking: Reported on 12/07/2024)   No facility-administered encounter medications on file as of 12/07/2024.    Review of Systems  Review of Systems  Constitutional: Negative.   HENT: Negative.    Cardiovascular: Negative.   Gastrointestinal: Negative.   Allergic/Immunologic: Negative.   Neurological: Negative.   Psychiatric/Behavioral: Negative.       Objective:   BP 101/72 (BP Location: Left Arm, Patient Position: Sitting, Cuff Size: Normal)   Pulse 99   Temp (!) 97.1 F (36.2 C) (Temporal)   Wt 235 lb (106.6 kg)   SpO2 100%   BMI 31.00 kg/m   Wt Readings from Last 5 Encounters:  12/07/24 235 lb (106.6 kg)  11/17/24 235 lb (106.6 kg)  11/11/24 233 lb 6.4 oz (105.9 kg)  11/11/24 233 lb (105.7 kg)  11/05/24 233 lb (105.7 kg)     Physical Exam Vitals and nursing note reviewed.  Constitutional:      General: He is not in acute distress.    Appearance: He is well-developed.  Cardiovascular:  Rate and Rhythm: Normal rate and regular rhythm.  Pulmonary:     Effort: Pulmonary effort is normal.     Breath sounds: Normal breath sounds.  Skin:    General: Skin is warm and dry.  Neurological:     Mental Status: He is alert and oriented to person, place, and time.       Assessment & Plan:   Chronic nonintractable headache, unspecified headache type -     Ambulatory referral to Neurology  Itch -     hydrOXYzine  Pamoate; Take 1 capsule (50 mg total) by mouth 3 (three) times daily as needed.  Dispense: 270 capsule; Refill: 2  Gastroesophageal reflux disease without esophagitis -     Omeprazole ; Take 1 capsule (40 mg total) by mouth daily as needed.  Dispense: 90 capsule; Refill: 1  Other orders -     Clindamycin  Phos (Once-Daily); Apply 1 Dose topically as needed.  Dispense: 75 mL; Refill: 1 -     Rosuvastatin  Calcium ; Take 1 tablet (10 mg total) by mouth daily.  Dispense: 90 tablet;  Refill: 3     Return in about 3 months (around 03/07/2025) for Physical.   Bascom GORMAN Borer, NP 12/07/2024     [1]  Allergies Allergen Reactions   Azithromycin  Itching  [2]  Social History Tobacco Use  Smoking Status Never  Smokeless Tobacco Never   "

## 2024-12-08 ENCOUNTER — Other Ambulatory Visit: Payer: Self-pay

## 2024-12-08 ENCOUNTER — Other Ambulatory Visit (HOSPITAL_COMMUNITY): Payer: Self-pay

## 2024-12-09 ENCOUNTER — Other Ambulatory Visit (HOSPITAL_COMMUNITY): Payer: Self-pay

## 2024-12-09 ENCOUNTER — Other Ambulatory Visit: Payer: Self-pay

## 2024-12-09 ENCOUNTER — Ambulatory Visit: Admitting: Physical Therapy

## 2024-12-09 ENCOUNTER — Ambulatory Visit

## 2024-12-09 ENCOUNTER — Other Ambulatory Visit

## 2024-12-09 ENCOUNTER — Ambulatory Visit: Admitting: Internal Medicine

## 2024-12-09 ENCOUNTER — Other Ambulatory Visit: Payer: Self-pay | Admitting: Internal Medicine

## 2024-12-09 DIAGNOSIS — B20 Human immunodeficiency virus [HIV] disease: Secondary | ICD-10-CM

## 2024-12-09 DIAGNOSIS — Z113 Encounter for screening for infections with a predominantly sexual mode of transmission: Secondary | ICD-10-CM

## 2024-12-09 DIAGNOSIS — M6281 Muscle weakness (generalized): Secondary | ICD-10-CM | POA: Diagnosis present

## 2024-12-09 DIAGNOSIS — M25512 Pain in left shoulder: Secondary | ICD-10-CM | POA: Insufficient documentation

## 2024-12-09 DIAGNOSIS — M26609 Unspecified temporomandibular joint disorder, unspecified side: Secondary | ICD-10-CM | POA: Insufficient documentation

## 2024-12-09 DIAGNOSIS — L299 Pruritus, unspecified: Secondary | ICD-10-CM

## 2024-12-09 DIAGNOSIS — M542 Cervicalgia: Secondary | ICD-10-CM | POA: Diagnosis present

## 2024-12-09 DIAGNOSIS — R1310 Dysphagia, unspecified: Secondary | ICD-10-CM | POA: Insufficient documentation

## 2024-12-09 NOTE — Therapy (Signed)
 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   Past Surgical History:  Procedure Laterality Date   APPENDECTOMY     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 Yes, Number of falls: 1 feels light headed at times - slipped on water in bathroom  MOI/History of condition:  Onset date: 12/4  SUBJECTIVE STATEMENT  Pt is a 49 y.o. male who presents to clinic with chief complaint of L shoulder and L  sided neck pain with headaches as well as bil jaw pain L>R.  Started after fall onto hard floor in bathroom when he slipped on wet floor.   Shoulder pain is worse with movement and OH reaching.  Has L sided neck pain from occiput to L shoulder.  Maybe some intermittent L UE numbness that comes sporadically for very short periods.  Also having bil jaw pain accompanied by clicking which is worse with eating and talking.      Pain:  Are you having pain? Yes Pain location: jaw pain NPRS scale:  Best: 0/10, Worst: 8/10 Aggravating factors: talking, eating Relieving factors: rest Pain description: intermittent  Are you having pain? Yes Pain location: L shoulder NPRS scale:  Best: 4-5/10, Worst: 9/10 Aggravating factors: movement Relieving factors: rest Pain description: intermittent  Occupation: NA  Assistive Device: NA  Hand Dominance: R  Patient Goals/Specific Activities: redcue pain   OBJECTIVE:   DIAGNOSTIC FINDINGS:  CT  IMPRESSION: 1. No acute traumatic injury identified in the face. 2. Parotid gland sialolithiasis.  SENSATION: Light touch: Appears intact   PALPATION: Significant TTP bil sub occipitals, L masseter, cervical paraspinals.  Minimal TTP L temporalis   Cervical ROM  ROM ROM  (Eval)  Flexion 25*  Extension 15*  Right lateral flexion 20*  Left lateral flexion 20*  Right rotation 30*  Left rotation 32*  Flexion rotation (normal is 30 degrees)   Flexion rotation (normal is 30 degrees)     (Blank rows = not tested, N =  WNL, * = concordant pain)  Shoulder flexion ROM: 120 w/ pain L, 135 R  UPPER EXTREMITY MMT:  MMT Right (Eval) Left (Eval)  Shoulder flexion    Shoulder abduction (C5)    Shoulder ER 5 3+*  Shoulder IR 5 4*  Middle trapezius    Lower trapezius    Shoulder extension    Grip strength    Shoulder shrug (C4)    Elbow flexion (C6)    Elbow ext (C7)    Thumb ext (C8)    Finger abd (T1)    Grossly     (Blank rows = not  tested, score listed is out of 5 possible points.  N = WNL, D = diminished, C = clear for gross weakness with myotome testing, * = concordant pain with testing)  Mandibular AROM Observation Resting Dental Alignment/Occlusion (Overbite, underbite, overjet, crossbite): Normal overbite of 3mm Mandible Depression (40-67mm):  Mandible Protrusion (3-61mm):  Mandible Lateral Excursion (10-80mm): R: L:  Audible joint sounds (crepitus or clicking with stethoscope with opening, lateral deviation, and bite): Positive Reciprocal Click (palpation, click with both opening/closing): Positive Deviation of Mouth During Opening: Positive for L deviation Deflection of Mouth at End Range: Positive for L deviation  TMJ AROM/PROM  Active ROM  mm Eval  Depression 45  Protrusion 3  Right lateral deviation 4  Left lateral deviation 6  Right rotation   Left rotation    (Blank rows = not tested)  UE MMT:  MMT  Eval  Mandibular Depression   Mandibular Protrusion   Mandibular Elevation   Mandibular lateral dev L   Mandibular lateral dev R    (Blank rows = not tested)    HOME EXERCISE PROGRAM: Access Code: [email or web address] URL: [email or web address] Date: 12/09/2024 Prepared by: Helene Gasmen  Exercises - Controlled Jaw Opening with Tongue on Roof of Mouth  - 2 x daily - 7 x weekly - 2 sets - 8 reps - Controlled TMJ Deviation  - 2 x daily - 7 x weekly - 2 sets - 8 reps  Treatment priorities   Eval        L DDWR        Likely L RCRP                                 TODAY'S TREATMENT:  Therapeutic Exercise: Creating, reviewing, and completing HEP   PATIENT EDUCATION ([place]/[facility]):  POC, diagnosis, prognosis, HEP, and outcome measures.  Pt educated via explanation, demonstration, and handout (HEP).  Pt confirms understanding verbally.   ASSESSMENT:  CLINICAL IMPRESSION: [name] is a 49 y.o. male who presents to clinic with signs and sxs consistent with L cervical, shoulder, and  TMJ pain following fall in early December.   Shoulder exam not completed in depth d/t time.  L TMJ pain consistent with DDWR.   Kenechukwu will benefit from skilled PT to address relevant deficits and improve comfort with daily tasks including talking, eating, neck movements, and reaching.   OBJECTIVE IMPAIRMENTS: Pain, mandibular ROM, shoulder ROM, cervical ROM, shoulder strength  ACTIVITY LIMITATIONS: chewing, talking, reaching, lifting, driving, reading  PERSONAL FACTORS: See medical history and pertinent history   REHAB POTENTIAL: Good  CLINICAL DECISION MAKING: Evolving/moderate complexity  EVALUATION COMPLEXITY: Moderate   GOALS:   SHORT TERM GOALS: Target date: 01/06/2025   Leny will be >75% HEP compliant to improve carryover between sessions and facilitate independent management of condition  Evaluation: ongoing  Goal status: INITIAL   LONG TERM GOALS: Target date: 02/03/2025   Semaj will self report >/= 50% decrease in pain from evaluation to improve function in daily tasks  Evaluation/Baseline: 8-9/10 max pain (see pain in chart) Goal status: INITIAL   2.  Ardie will show a >/= 12 pt improvement in their NDI score as a proxy for functional improvement  Evaluation/Baseline: 32/50 pts Goal status: INITIAL   3.  Dow will be able to eat and talk, not limited by pain  Evaluation/Baseline: limited Goal status: INITIAL   4.  Alegandro will show significant improvement in cervical ROM not restricted by pain  Evaluation/Baseline:   ROM ROM  (Eval)  Flexion 25*  Extension 15*  Right lateral flexion 20*  Left lateral flexion 20*  Right rotation 30*  Left rotation 32*  Flexion rotation (normal is 30 degrees)   Flexion rotation (normal is 30 degrees)     (Blank rows = not tested, N = WNL, * = concordant pain)  Goal status: INITIAL   5.  Jasier will be able to reach repeatedly to first cabinet shelf with weight equivalent  to plate, not limited by pain  Evaluation/Baseline: limited Goal status: INITIAL    PLAN: PT FREQUENCY: 1-2x/week  PT DURATION: 8 weeks  PLANNED INTERVENTIONS:  97164- PT Re-evaluation, 97110-Therapeutic exercises, 97530- Therapeutic activity, 97112- Neuromuscular re-education, 97535- Self Care, 02859- Manual therapy, [id number]- Gait training, [id number]- Aquatic Therapy, [phone]- Electrical stimulation (manual), [id number]- Vasopneumatic device, [id number]- Traction (mechanical), [id number]- Ionotophoresis 4mg /ml Dexamethasone , Taping, Dry Needling, Joint manipulation, and Spinal manipulation.   Amal Saiki E Yeily Link PT 12/09/2024, 8:10 AM  I just finished a MCD eval/recert.  Name: HARUTO DEMARIA  MRN: [id number] Please request 2x/week for 8 weeks.  Check all conditions that are expected to impact treatment: Musculoskeletal disorders   I DID put a charge in.  Check all possible CPT codes: 02889- Therapeutic Exercise, [phone]- Neuro Re-education, [phone] - Gait Training, [phone] - Manual Therapy, 97530 - Therapeutic Activities, 97535 - Self Care, [phone] - Re-evaluation, [id number] - Mechanical traction, and [phone] - Aquatic therapy   Thank you!  MCD - Secure   "

## 2024-12-10 ENCOUNTER — Other Ambulatory Visit: Payer: Self-pay

## 2024-12-10 LAB — T-HELPER CELL (CD4) - (RCID CLINIC ONLY)
CD4 % Helper T Cell: 42 % (ref 33–65)
CD4 T Cell Abs: 1006 /uL (ref 400–1790)

## 2024-12-11 ENCOUNTER — Other Ambulatory Visit: Payer: Self-pay

## 2024-12-11 LAB — COMPLETE METABOLIC PANEL WITHOUT GFR
AG Ratio: 1.5 (calc) (ref 1.0–2.5)
ALT: 23 U/L (ref 9–46)
AST: 23 U/L (ref 10–40)
Albumin: 4.4 g/dL (ref 3.6–5.1)
Alkaline phosphatase (APISO): 69 U/L (ref 36–130)
BUN: 14 mg/dL (ref 7–25)
CO2: 28 mmol/L (ref 20–32)
Calcium: 9 mg/dL (ref 8.6–10.3)
Chloride: 103 mmol/L (ref 98–110)
Creat: 1.27 mg/dL (ref 0.60–1.29)
Globulin: 2.9 g/dL (ref 1.9–3.7)
Glucose, Bld: 106 mg/dL — ABNORMAL HIGH (ref 65–99)
Potassium: 4.3 mmol/L (ref 3.5–5.3)
Sodium: 137 mmol/L (ref 135–146)
Total Bilirubin: 0.5 mg/dL (ref 0.2–1.2)
Total Protein: 7.3 g/dL (ref 6.1–8.1)

## 2024-12-11 LAB — CBC WITH DIFFERENTIAL/PLATELET
Absolute Lymphocytes: 2563 {cells}/uL (ref 850–3900)
Absolute Monocytes: 431 {cells}/uL (ref 200–950)
Basophils Absolute: 39 {cells}/uL (ref 0–200)
Basophils Relative: 0.8 %
Eosinophils Absolute: 230 {cells}/uL (ref 15–500)
Eosinophils Relative: 4.7 %
HCT: 38.3 % — ABNORMAL LOW (ref 39.4–51.1)
Hemoglobin: 12.7 g/dL — ABNORMAL LOW (ref 13.2–17.1)
MCH: 30.2 pg (ref 27.0–33.0)
MCHC: 33.2 g/dL (ref 31.6–35.4)
MCV: 91.2 fL (ref 81.4–101.7)
MPV: 9.5 fL (ref 7.5–12.5)
Monocytes Relative: 8.8 %
Neutro Abs: 1637 {cells}/uL (ref 1500–7800)
Neutrophils Relative %: 33.4 %
Platelets: 344 Thousand/uL (ref 140–400)
RBC: 4.2 Million/uL (ref 4.20–5.80)
RDW: 13.2 % (ref 11.0–15.0)
Total Lymphocyte: 52.3 %
WBC: 4.9 Thousand/uL (ref 3.8–10.8)

## 2024-12-11 LAB — SYPHILIS: RPR W/REFLEX TO RPR TITER AND TREPONEMAL ANTIBODIES, TRADITIONAL SCREENING AND DIAGNOSIS ALGORITHM: RPR Ser Ql: NONREACTIVE

## 2024-12-11 LAB — HIV-1 RNA QUANT-NO REFLEX-BLD
HIV 1 RNA Quant: NOT DETECTED {copies}/mL
HIV-1 RNA Quant, Log: NOT DETECTED {Log_copies}/mL

## 2024-12-12 ENCOUNTER — Other Ambulatory Visit (HOSPITAL_COMMUNITY): Payer: Self-pay

## 2024-12-13 ENCOUNTER — Other Ambulatory Visit: Payer: Self-pay

## 2024-12-14 ENCOUNTER — Other Ambulatory Visit: Payer: Self-pay

## 2024-12-14 ENCOUNTER — Other Ambulatory Visit: Payer: Self-pay | Admitting: Nurse Practitioner

## 2024-12-14 ENCOUNTER — Ambulatory Visit: Admitting: Physical Therapy

## 2024-12-14 ENCOUNTER — Encounter: Payer: Self-pay | Admitting: Physical Therapy

## 2024-12-14 DIAGNOSIS — M542 Cervicalgia: Secondary | ICD-10-CM

## 2024-12-14 DIAGNOSIS — M25512 Pain in left shoulder: Secondary | ICD-10-CM

## 2024-12-14 DIAGNOSIS — R1310 Dysphagia, unspecified: Secondary | ICD-10-CM

## 2024-12-14 DIAGNOSIS — M6281 Muscle weakness (generalized): Secondary | ICD-10-CM

## 2024-12-14 NOTE — Telephone Encounter (Signed)
 triamcinolone  ointment (KENALOG ) 0.5 %

## 2024-12-14 NOTE — Telephone Encounter (Signed)
 Please advise [facility]

## 2024-12-14 NOTE — Therapy (Signed)
 " OUTPATIENT PHYSICAL THERAPY DAILY NOTE   Patient Name: Todd Carroll MRN: [id number] DOB:February 22, 1976, 49 y.o., male Today's Date: 12/14/2024   PT End of Session - 12/14/24 1012     Visit Number 2    Number of Visits 4    Date for Recertification  02/03/25    Authorization Type [employer]    Authorization Time Period Approval for 4 pt visits from 12/09/24-01/07/25    PT Start Time 1015    PT Stop Time 1056    PT Time Calculation (min) 41 min          Past Medical History:  Diagnosis Date   ALLERGIC RHINITIS 08/27/2007   Allergic rhinitis 08/27/2007   Qualifier: Diagnosis of   By: Lilton CMA, Jenetta      IMO SNOMED Dx Update Oct 2024     Allergy    GERD 05/04/2008   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   Human immunodeficiency virus (HIV) disease (HCC) 02/27/2013   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   Past Surgical History:  Procedure Laterality Date   APPENDECTOMY     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   Diarrhea 08/10/2021   Streptococcus A carrier or suspected carrier 07/24/2019   Underinsured 07/24/2019   Dysphagia 10/23/2018   Constipation 10/23/2018   Hematospermia 11/01/2014   Scalp pruritus 07/25/2014   Itch 05/25/2014   Rash and nonspecific skin eruption 01/05/2014   Folliculitis 07/13/2013   Pain, dental 07/13/2013   Dysuria 04/29/2013   Human immunodeficiency virus (HIV) disease (HCC) 02/27/2013   GERD 05/04/2008   Allergic rhinitis 08/27/2007    PCP: Oley Bascom RAMAN, NP  REFERRING PROVIDER: Oley Bascom RAMAN, NP  THERAPY DIAG:  Cervicalgia  Left shoulder pain, unspecified chronicity  Muscle weakness  Dysphagia, unspecified type  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 Yes, Number of falls: 1 feels light headed at times - slipped on water in bathroom  MOI/History of condition:  Onset date: 12/4  SUBJECTIVE STATEMENT  12/14/2024: He reports the misplace his HEP.  Pain is about the same.    EVAL: Pt is a 49 y.o. male who presents to clinic with chief complaint of L shoulder and L sided neck pain with headaches as well as bil jaw pain L>R.  Started after fall onto hard floor in bathroom when he slipped on wet floor.   Shoulder pain is worse with movement and OH reaching.  Has L sided neck pain from occiput to L shoulder.  Maybe some intermittent L UE numbness that comes sporadically for very short periods.  Also having bil jaw pain accompanied by clicking which is worse with eating and talking.      Pain:  Are you having pain? Yes Pain location: jaw pain NPRS scale:  Best: 0/10, Worst: 8/10 Aggravating factors: talking, eating Relieving factors: rest Pain description: intermittent  Are you having pain? Yes Pain location: L shoulder NPRS scale:  Best: 4-5/10, Worst: 9/10 Aggravating factors: movement Relieving factors: rest Pain description: intermittent  Occupation: NA  Assistive Device: NA  Hand Dominance: R  Patient  Goals/Specific Activities: redcue pain   OBJECTIVE:   DIAGNOSTIC FINDINGS:  CT  IMPRESSION: 1. No acute traumatic injury identified in the face. 2. Parotid gland sialolithiasis.  SENSATION: Light touch: Appears intact   PALPATION: Significant TTP bil sub occipitals, L masseter, cervical paraspinals.  Minimal TTP L temporalis   Cervical ROM  ROM ROM  (Eval)  Flexion 25*  Extension 15*  Right lateral flexion 20*  Left lateral flexion 20*  Right rotation 30*  Left rotation 32*  Flexion rotation (normal is 30 degrees)   Flexion rotation  (normal is 30 degrees)     (Blank rows = not tested, N = WNL, * = concordant pain)  Shoulder flexion ROM: 120 w/ pain L, 135 R  UPPER EXTREMITY MMT:  MMT Right (Eval) Left (Eval)  Shoulder flexion    Shoulder abduction (C5)    Shoulder ER 5 3+*  Shoulder IR 5 4*  Middle trapezius    Lower trapezius    Shoulder extension    Grip strength    Shoulder shrug (C4)    Elbow flexion (C6)    Elbow ext (C7)    Thumb ext (C8)    Finger abd (T1)    Grossly     (Blank rows = not tested, score listed is out of 5 possible points.  N = WNL, D = diminished, C = clear for gross weakness with myotome testing, * = concordant pain with testing)  Mandibular AROM Observation Resting Dental Alignment/Occlusion (Overbite, underbite, overjet, crossbite): Normal overbite of 3mm Mandible Depression (40-65mm):  Mandible Protrusion (3-24mm):  Mandible Lateral Excursion (10-60mm): R: L:  Audible joint sounds (crepitus or clicking with stethoscope with opening, lateral deviation, and bite): Positive Reciprocal Click (palpation, click with both opening/closing): Positive Deviation of Mouth During Opening: Positive for L deviation Deflection of Mouth at End Range: Positive for L deviation  TMJ AROM/PROM  Active ROM  mm Eval  Depression 45  Protrusion 3  Right lateral deviation 4  Left lateral deviation 6  Right rotation   Left rotation    (Blank rows = not tested)  UE MMT:  MMT  Eval  Mandibular Depression   Mandibular Protrusion   Mandibular Elevation   Mandibular lateral dev L   Mandibular lateral dev R    (Blank rows = not tested)    HOME EXERCISE PROGRAM: Access Code: [email or web address] URL: [email or web address] Date: 12/14/2024 Prepared by: Helene Gasmen  Exercises - Controlled Jaw Opening with Tongue on Roof of Mouth  - 2 x daily - 7 x weekly - 2 sets - 8 reps - Controlled TMJ Deviation  - 2 x daily - 7 x weekly - 2 sets - 8 reps - Isometric Jaw Deviation  - 1 x  daily - 7 x weekly - 2 sets - 5 reps - 5'' hold - Isometric Jaw Abduction  - 1 x daily - 7 x weekly - 2 sets - 5 reps - 5'' hold - Isometric Jaw Adduction  - 1 x daily - 7 x weekly - 2 sets - 5 reps - 5 hold  Treatment priorities   Eval        L DDWR        Likely L RCRP                                 TODAY'S TREATMENT:  [facility] Adult PT Treatment  12/15/2023:  Therapeutic Exercise: Scapular squeezes -  2x5x5s Isometrics - open/close/lateral - 5x5s ea  Manual Therapy  Mandibular depression - painful, stopped Isometrics to demonstrate proper force Controlled opening  Therapeutic Activity  Education on TMJ anatomy and goals of therapy Avoiding agging foods     ASSESSMENT:  CLINICAL IMPRESSION:  12/14/2024:  Pt reports that he misplaced his HEP and has not completed exercise.  HEP reissued.  Discussed importance of avoiding agging foods.  Reviewed new HEP which includes isometrics and controlled opening.  Will add in shoulder exercises next visit.  EVAL: Nakota is a 49 y.o. male who presents to clinic with signs and sxs consistent with L cervical, shoulder, and TMJ pain following fall in early December.   Shoulder exam not completed in depth d/t time.  L TMJ pain consistent with DDWR.   Labib will benefit from skilled PT to address relevant deficits and improve comfort with daily tasks including talking, eating, neck movements, and reaching.   OBJECTIVE IMPAIRMENTS: Pain, mandibular ROM, shoulder ROM, cervical ROM, shoulder strength  ACTIVITY LIMITATIONS: chewing, talking, reaching, lifting, driving, reading  PERSONAL FACTORS: See medical history and pertinent history   REHAB POTENTIAL: Good  CLINICAL DECISION MAKING: Evolving/moderate complexity  EVALUATION COMPLEXITY: Moderate   GOALS:   SHORT TERM GOALS: Target date: 01/06/2025   Suleiman will be >75% HEP compliant to improve carryover between sessions and facilitate independent management of  condition  Evaluation: ongoing Goal status: INITIAL   LONG TERM GOALS: Target date: 02/03/2025   Zalmen will self report >/= 50% decrease in pain from evaluation to improve function in daily tasks  Evaluation/Baseline: 8-9/10 max pain (see pain in chart) Goal status: INITIAL   2.  Timarion will show a >/= 12 pt improvement in their NDI score as a proxy for functional improvement  Evaluation/Baseline: 32/50 pts Goal status: INITIAL   3.  Mylz will be able to eat and talk, not limited by pain  Evaluation/Baseline: limited Goal status: INITIAL   4.  Loki will show significant improvement in cervical ROM not restricted by pain  Evaluation/Baseline:   ROM ROM  (Eval)  Flexion 25*  Extension 15*  Right lateral flexion 20*  Left lateral flexion 20*  Right rotation 30*  Left rotation 32*  Flexion rotation (normal is 30 degrees)   Flexion rotation (normal is 30 degrees)     (Blank rows = not tested, N = WNL, * = concordant pain)  Goal status: INITIAL   5.  Daksh will be able to reach repeatedly to first cabinet shelf with weight equivalent to plate, not limited by pain  Evaluation/Baseline: limited Goal status: INITIAL    PLAN: PT FREQUENCY: 1-2x/week  PT DURATION: 8 weeks  PLANNED INTERVENTIONS:  97164- PT Re-evaluation, 97110-Therapeutic exercises, 97530- Therapeutic activity, 97112- Neuromuscular re-education, 97535- Self Care, 02859- Manual therapy, [id number]- Gait training, [id number]- Aquatic Therapy, [phone]- Electrical stimulation (manual), [id number]- Vasopneumatic device, [id number]- Traction (mechanical), [id number]- Ionotophoresis 4mg /ml Dexamethasone , Taping, Dry Needling, Joint manipulation, and Spinal manipulation.   Willys Salvino E Piya Mesch PT 12/14/2024, 11:00 AM  I just finished a MCD eval/recert.  Name: MUKESH KORNEGAY  MRN: [id number] Please request 2x/week for 8 weeks.  Check all conditions that are expected to impact treatment:  Musculoskeletal disorders   I DID put a charge in.  Check all possible CPT codes: 02889- Therapeutic Exercise, [phone]- Neuro Re-education, [phone] - Gait Training, [phone] - Manual Therapy, 97530 - Therapeutic Activities, 97535 - Self Care, [phone] - Re-evaluation, [id number] - Mechanical traction, and [phone] - Aquatic therapy  Thank you!  MCD - Secure   "

## 2024-12-15 ENCOUNTER — Other Ambulatory Visit (HOSPITAL_COMMUNITY): Payer: Self-pay

## 2024-12-15 ENCOUNTER — Other Ambulatory Visit: Payer: Self-pay

## 2024-12-15 ENCOUNTER — Other Ambulatory Visit: Payer: Self-pay | Admitting: Internal Medicine

## 2024-12-15 ENCOUNTER — Other Ambulatory Visit: Payer: Self-pay | Admitting: Nurse Practitioner

## 2024-12-15 ENCOUNTER — Other Ambulatory Visit (INDEPENDENT_AMBULATORY_CARE_PROVIDER_SITE_OTHER): Payer: Self-pay

## 2024-12-15 DIAGNOSIS — L299 Pruritus, unspecified: Secondary | ICD-10-CM

## 2024-12-15 DIAGNOSIS — M25512 Pain in left shoulder: Secondary | ICD-10-CM

## 2024-12-15 MED ORDER — CYCLOBENZAPRINE HCL 10 MG PO TABS
10.0000 mg | ORAL_TABLET | Freq: Three times a day (TID) | ORAL | 0 refills | Status: AC | PRN
  Filled 2024-12-15 – 2025-01-05 (×3): qty 90, 30d supply, fill #0

## 2024-12-15 MED ORDER — TRIAMCINOLONE ACETONIDE 0.5 % EX OINT
1.0000 | TOPICAL_OINTMENT | Freq: Two times a day (BID) | CUTANEOUS | 0 refills | Status: AC
  Filled 2024-12-15 – 2024-12-24 (×3): qty 30, 15d supply, fill #0

## 2024-12-15 NOTE — Telephone Encounter (Signed)
 Please Advise.  CB.

## 2024-12-15 NOTE — Telephone Encounter (Signed)
cyclobenzaprine (FLEXERIL) 10 MG tablet

## 2024-12-16 ENCOUNTER — Other Ambulatory Visit: Payer: Self-pay

## 2024-12-17 ENCOUNTER — Ambulatory Visit: Admitting: Physical Therapy

## 2024-12-17 ENCOUNTER — Other Ambulatory Visit: Payer: Self-pay | Admitting: Nurse Practitioner

## 2024-12-18 ENCOUNTER — Other Ambulatory Visit (HOSPITAL_COMMUNITY): Payer: Self-pay

## 2024-12-18 ENCOUNTER — Other Ambulatory Visit: Payer: Self-pay

## 2024-12-18 MED ORDER — MOXIFLOXACIN HCL 0.5 % OP SOLN
1.0000 [drp] | Freq: Three times a day (TID) | OPHTHALMIC | 0 refills | Status: DC
  Filled 2024-12-18: qty 3, 20d supply, fill #0

## 2024-12-18 NOTE — Telephone Encounter (Signed)
 moxifloxacin (VIGAMOX) 0.5 % ophthalmic solution

## 2024-12-18 NOTE — Telephone Encounter (Signed)
 Please advise [facility]

## 2024-12-20 ENCOUNTER — Other Ambulatory Visit (HOSPITAL_COMMUNITY): Payer: Self-pay

## 2024-12-21 ENCOUNTER — Other Ambulatory Visit (HOSPITAL_COMMUNITY): Payer: Self-pay

## 2024-12-21 ENCOUNTER — Ambulatory Visit (HOSPITAL_COMMUNITY)
Admission: RE | Admit: 2024-12-21 | Discharge: 2024-12-21 | Disposition: A | Source: Ambulatory Visit | Attending: Nurse Practitioner

## 2024-12-21 ENCOUNTER — Other Ambulatory Visit (HOSPITAL_BASED_OUTPATIENT_CLINIC_OR_DEPARTMENT_OTHER): Payer: Self-pay

## 2024-12-21 ENCOUNTER — Other Ambulatory Visit: Payer: Self-pay

## 2024-12-21 DIAGNOSIS — R059 Cough, unspecified: Secondary | ICD-10-CM | POA: Diagnosis not present

## 2024-12-21 DIAGNOSIS — R079 Chest pain, unspecified: Secondary | ICD-10-CM | POA: Diagnosis not present

## 2024-12-21 DIAGNOSIS — R09A2 Foreign body sensation, throat: Secondary | ICD-10-CM | POA: Diagnosis not present

## 2024-12-21 DIAGNOSIS — R1310 Dysphagia, unspecified: Secondary | ICD-10-CM | POA: Insufficient documentation

## 2024-12-21 DIAGNOSIS — Z21 Asymptomatic human immunodeficiency virus [HIV] infection status: Secondary | ICD-10-CM | POA: Diagnosis not present

## 2024-12-21 DIAGNOSIS — R0989 Other specified symptoms and signs involving the circulatory and respiratory systems: Secondary | ICD-10-CM | POA: Insufficient documentation

## 2024-12-21 DIAGNOSIS — H9192 Unspecified hearing loss, left ear: Secondary | ICD-10-CM | POA: Insufficient documentation

## 2024-12-21 DIAGNOSIS — R6884 Jaw pain: Secondary | ICD-10-CM | POA: Insufficient documentation

## 2024-12-21 DIAGNOSIS — R1314 Dysphagia, pharyngoesophageal phase: Secondary | ICD-10-CM

## 2024-12-21 NOTE — Evaluation (Signed)
 Modified Barium Swallow Study  Patient Details  Name: Todd Carroll MRN: [id number] Date of Birth: 1976/01/04  Today's Date: 12/21/2024  Modified Barium Swallow completed.  Full report located under Chart Review in the Imaging Section.  History of Present Illness   Pt is a 49 y/o male presenting for MBS due to complaints of progressive dysphagia following a fall in 12/25. Pt reports globus sensation, requiring water to facilitate swallowing. Pt reports worsening symptoms resulting in apprehension about eating. Additionally, pt reports coughing and throat clearing during/after meals, belching, regurgitation, trouble swallowing pills, painful swallow, chest pain during/after meals, chest congestion, coughing up mucous/phlegm. Pt also reports trouble chewing. Pt reports currently consuming a regular diet. PMH: GERD, dysphagia, nasal septal perforation, jaw pain, spondylolysis of cervical spine, dizziness, left-sided deafness, and allergic rhinitis. Laryngoscopy performed 11/17/24 WFL.   Clinical Impression   Pt consumed all textures and thicknesses w/o aspiration or pharyngeal residue. Pt displays adequate oral and pharyngeal skills for a functional swallow. Question possible esophageal etiology with handout provided with strategies.  Pt displays escape to floor of mouth and lateral buccal cavities during bolus hold of thin liquids, however orally transits all consistencies without concern. Suspect pt reported trouble chewing is related to jaw pain as opposed to oral dysphagia as no difficulty with mastication observed. Trace esophageal residue seen without retrograde flow.   Factors that may increase risk of adverse event in presence of aspiration Noe & Lianne 2021):  N/A  Swallow Evaluation Recommendations Recommendations: PO diet PO Diet Recommendation: Regular;Thin liquids (Level 0) Liquid Administration via: Cup;Straw Medication Administration: Whole meds with  liquid Supervision: Patient able to self-feed Swallowing strategies  : Slow rate;Small bites/sips Postural changes: Position pt fully upright for meals;Stay upright 30-60 min after meals Oral care recommendations: Oral care BID (2x/day)      Rocky Quan, Student SLP  12/21/2024,12:47 PM

## 2024-12-22 ENCOUNTER — Ambulatory Visit: Admitting: Physical Therapy

## 2024-12-22 ENCOUNTER — Other Ambulatory Visit: Payer: Self-pay

## 2024-12-22 ENCOUNTER — Encounter: Payer: Self-pay | Admitting: Physical Therapy

## 2024-12-22 ENCOUNTER — Other Ambulatory Visit (HOSPITAL_COMMUNITY): Payer: Self-pay

## 2024-12-22 DIAGNOSIS — M542 Cervicalgia: Secondary | ICD-10-CM

## 2024-12-22 DIAGNOSIS — M25512 Pain in left shoulder: Secondary | ICD-10-CM

## 2024-12-22 DIAGNOSIS — M6281 Muscle weakness (generalized): Secondary | ICD-10-CM

## 2024-12-22 NOTE — Therapy (Signed)
 " OUTPATIENT PHYSICAL THERAPY DAILY NOTE   Patient Name: Todd Carroll MRN: [id number] DOB:May 15, 1976, 49 y.o., male Today's Date: 12/22/2024   PT End of Session - 12/22/24 0715     Visit Number 3    Number of Visits 4    Date for Recertification  02/03/25    Authorization Type [employer]    Authorization Time Period Approval for 4 pt visits from 12/09/24-01/07/25    PT Start Time 0715    PT Stop Time 0756    PT Time Calculation (min) 41 min          Past Medical History:  Diagnosis Date   ALLERGIC RHINITIS 08/27/2007   Allergic rhinitis 08/27/2007   Qualifier: Diagnosis of   By: Lilton CMA, Jenetta      IMO SNOMED Dx Update Oct 2024     Allergy    GERD 05/04/2008   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   Human immunodeficiency virus (HIV) disease (HCC) 02/27/2013   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   Past Surgical History:  Procedure Laterality Date   APPENDECTOMY     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   Diarrhea 08/10/2021   Streptococcus A carrier or suspected carrier 07/24/2019   Underinsured 07/24/2019   Dysphagia 10/23/2018   Constipation 10/23/2018   Hematospermia 11/01/2014   Scalp pruritus 07/25/2014   Itch 05/25/2014   Rash and nonspecific skin eruption 01/05/2014   Folliculitis 07/13/2013   Pain, dental 07/13/2013   Dysuria 04/29/2013   Human immunodeficiency virus (HIV) disease (HCC) 02/27/2013   GERD 05/04/2008   Allergic rhinitis 08/27/2007    PCP: Oley Bascom RAMAN, NP  REFERRING PROVIDER: Oley Bascom RAMAN, NP  THERAPY DIAG:  Cervicalgia  Left shoulder pain, unspecified chronicity  Muscle weakness  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 Yes, Number of falls: 1 feels light headed at times - slipped on water in bathroom  MOI/History of condition:  Onset date: 12/4  SUBJECTIVE STATEMENT  12/22/2024: Pt reports that his jaw is in more pain than normal.  Pt reports that he has tried to complete exercises but they cause more pain.  Pt reports that the clicking is happening more often.   EVAL: Pt is a 49 y.o. male who presents to clinic with chief complaint of L shoulder and L sided neck pain with headaches as well as bil jaw pain L>R.  Started after fall onto hard floor in bathroom when he slipped on wet floor.   Shoulder pain is worse with movement and OH reaching.  Has L sided neck pain from occiput to L shoulder.  Maybe some intermittent L UE numbness that comes sporadically for very short periods.  Also having bil jaw pain accompanied by clicking which is worse with eating and talking.      Pain:  Are you having pain? Yes Pain location: jaw pain NPRS scale:  Best: 0/10, Worst: 8/10 Aggravating factors: talking, eating Relieving factors: rest Pain description: intermittent  Are you having pain? Yes Pain location: L shoulder NPRS scale:  Best: 4-5/10, Worst: 9/10 Aggravating factors: movement  Relieving factors: rest Pain description: intermittent  Occupation: NA  Assistive Device: NA  Hand Dominance: R  Patient Goals/Specific Activities: redcue pain   OBJECTIVE:   DIAGNOSTIC FINDINGS:  CT  IMPRESSION: 1. No acute traumatic injury identified in the face. 2. Parotid gland sialolithiasis.  SENSATION: Light touch: Appears intact   PALPATION: Significant TTP bil sub occipitals, L masseter, cervical paraspinals.  Minimal TTP L temporalis   Cervical ROM  ROM ROM  (Eval)  Flexion 25*  Extension 15*  Right lateral flexion 20*  Left lateral flexion 20*  Right rotation 30*  Left  rotation 32*  Flexion rotation (normal is 30 degrees)   Flexion rotation (normal is 30 degrees)     (Blank rows = not tested, N = WNL, * = concordant pain)  Shoulder flexion ROM: 120 w/ pain L, 135 R  UPPER EXTREMITY MMT:  MMT Right (Eval) Left (Eval)  Shoulder flexion    Shoulder abduction (C5)    Shoulder ER 5 3+*  Shoulder IR 5 4*  Middle trapezius    Lower trapezius    Shoulder extension    Grip strength    Shoulder shrug (C4)    Elbow flexion (C6)    Elbow ext (C7)    Thumb ext (C8)    Finger abd (T1)    Grossly     (Blank rows = not tested, score listed is out of 5 possible points.  N = WNL, D = diminished, C = clear for gross weakness with myotome testing, * = concordant pain with testing)  Mandibular AROM Observation Resting Dental Alignment/Occlusion (Overbite, underbite, overjet, crossbite): Normal overbite of 3mm Mandible Depression (40-2mm):  Mandible Protrusion (3-31mm):  Mandible Lateral Excursion (10-14mm): R: L:  Audible joint sounds (crepitus or clicking with stethoscope with opening, lateral deviation, and bite): Positive Reciprocal Click (palpation, click with both opening/closing): Positive Deviation of Mouth During Opening: Positive for L deviation Deflection of Mouth at End Range: Positive for L deviation  TMJ AROM/PROM  Active ROM  mm Eval  Depression 45  Protrusion 3  Right lateral deviation 4  Left lateral deviation 6  Right rotation   Left rotation    (Blank rows = not tested)  UE MMT:  MMT  Eval  Mandibular Depression   Mandibular Protrusion   Mandibular Elevation   Mandibular lateral dev L   Mandibular lateral dev R    (Blank rows = not tested)    HOME EXERCISE PROGRAM: Access Code: [email or web address] URL: [email or web address] Date: 12/22/2024 Prepared by: Helene Gasmen  Exercises - Standing Shoulder Row with Anchored Resistance  - 1 x daily - 7 x weekly - 2 sets - 8 reps - 3s hold - Standing Isometric  Shoulder External Rotation with Doorway  - 1 x daily - 7 x weekly - 1 sets - 5 reps - 30 second hold - Seated Scapular Retraction  - 1 x daily - 7 x weekly - 2 sets - 8 reps - 5 hold  Treatment priorities   Eval        L DDWR        Likely L RCRP                                 TODAY'S TREATMENT:  [facility] Adult PT Treatment  12/23/2023:  Therapeutic Exercise: Scapular squeezes - 2x5x5s ER isometric Blue TB row - 2x8x3s Shoulder ext - stopped d/t pain Shrug 2x4x5s  Manual Therapy  STM masseter and temporalis      ASSESSMENT:  CLINICAL IMPRESSION:  12/22/2024:  Pt reports no improvement in his jaw pain.  Reports continued clicking and pain with chewing.  Concentrated more on shoulder today and encouraged him to try to reduce strain on TMJ with avoidance of hard, crunchy, or chewy food.  Good response to initial shoulder treatment.  EVAL: Kellar is a 49 y.o. male who presents to clinic with signs and sxs consistent with L cervical, shoulder, and TMJ pain following fall in early December.   Shoulder exam not completed in depth d/t time.  L TMJ pain consistent with DDWR.   Arrion will benefit from skilled PT to address relevant deficits and improve comfort with daily tasks including talking, eating, neck movements, and reaching.   OBJECTIVE IMPAIRMENTS: Pain, mandibular ROM, shoulder ROM, cervical ROM, shoulder strength  ACTIVITY LIMITATIONS: chewing, talking, reaching, lifting, driving, reading  PERSONAL FACTORS: See medical history and pertinent history   REHAB POTENTIAL: Good  CLINICAL DECISION MAKING: Evolving/moderate complexity  EVALUATION COMPLEXITY: Moderate   GOALS:   SHORT TERM GOALS: Target date: 01/06/2025   Doyel will be >75% HEP compliant to improve carryover between sessions and facilitate independent management of condition  Evaluation: ongoing Goal status: INITIAL   LONG TERM GOALS: Target date: 02/03/2025   Fischer will self  report >/= 50% decrease in pain from evaluation to improve function in daily tasks  Evaluation/Baseline: 8-9/10 max pain (see pain in chart) Goal status: INITIAL   2.  Quintavis will show a >/= 12 pt improvement in their NDI score as a proxy for functional improvement  Evaluation/Baseline: 32/50 pts Goal status: INITIAL   3.  Brandan will be able to eat and talk, not limited by pain  Evaluation/Baseline: limited Goal status: INITIAL   4.  Ples will show significant improvement in cervical ROM not restricted by pain  Evaluation/Baseline:   ROM ROM  (Eval)  Flexion 25*  Extension 15*  Right lateral flexion 20*  Left lateral flexion 20*  Right rotation 30*  Left rotation 32*  Flexion rotation (normal is 30 degrees)   Flexion rotation (normal is 30 degrees)     (Blank rows = not tested, N = WNL, * = concordant pain)  Goal status: INITIAL   5.  Ramiz will be able to reach repeatedly to first cabinet shelf with weight equivalent to plate, not limited by pain  Evaluation/Baseline: limited Goal status: INITIAL    PLAN: PT FREQUENCY: 1-2x/week  PT DURATION: 8 weeks  PLANNED INTERVENTIONS:  97164- PT Re-evaluation, 97110-Therapeutic exercises, 97530- Therapeutic activity, 97112- Neuromuscular re-education, 97535- Self Care, 02859- Manual therapy, [id number]- Gait training, [id number]- Aquatic Therapy, [phone]- Electrical stimulation (manual), [id number]- Vasopneumatic device, [id number]- Traction (mechanical), [id number]- Ionotophoresis 4mg /ml Dexamethasone , Taping, Dry Needling, Joint manipulation, and Spinal manipulation.   Helene BRAVO Machi Whittaker PT 12/22/2024, 8:09 AM  I just finished a MCD eval/recert.  Name: DAI MCADAMS  MRN: [id number] Please request 2x/week for 8 weeks.  Check all conditions that are expected to impact treatment: Musculoskeletal disorders   I DID put a charge in.  Check all possible CPT codes: 02889- Therapeutic Exercise, [phone]- Neuro  Re-education, [phone] - Gait Training, [phone] - Manual Therapy, 97530 - Therapeutic Activities, 97535 - Self Care, [phone] - Re-evaluation, [id number] - Mechanical traction, and [id number] - Aquatic therapy   Thank you!  MCD - Secure   "

## 2024-12-24 ENCOUNTER — Other Ambulatory Visit: Payer: Self-pay

## 2024-12-24 ENCOUNTER — Other Ambulatory Visit: Payer: Self-pay | Admitting: Nurse Practitioner

## 2024-12-24 ENCOUNTER — Ambulatory Visit: Admitting: Physical Therapy

## 2024-12-24 ENCOUNTER — Encounter: Payer: Self-pay | Admitting: Physical Therapy

## 2024-12-24 DIAGNOSIS — M542 Cervicalgia: Secondary | ICD-10-CM | POA: Diagnosis not present

## 2024-12-24 DIAGNOSIS — M6281 Muscle weakness (generalized): Secondary | ICD-10-CM

## 2024-12-24 DIAGNOSIS — M25512 Pain in left shoulder: Secondary | ICD-10-CM

## 2024-12-24 MED ORDER — TIZANIDINE HCL 4 MG PO TABS
4.0000 mg | ORAL_TABLET | Freq: Two times a day (BID) | ORAL | 0 refills | Status: AC | PRN
  Filled 2024-12-24 – 2024-12-28 (×2): qty 60, 30d supply, fill #0

## 2024-12-24 NOTE — Telephone Encounter (Signed)
 Please advise [facility]

## 2024-12-24 NOTE — Progress Notes (Signed)
 Specialty Pharmacy Refill Coordination Note  Merit JAXIN FULFER is a 49 y.o. male contacted today regarding refills of specialty medication(s) Bictegravir-Emtricitab-Tenofov (Biktarvy )   Patient requested (Patient-Rptd) Delivery   Delivery date: 12/31/24   Verified address: (Patient-Rptd) [address] [place] [address]   Medication will be filled on: 12/30/24

## 2024-12-24 NOTE — Therapy (Signed)
 " OUTPATIENT PHYSICAL THERAPY DAILY NOTE   Patient Name: Todd Carroll MRN: [id number] DOB:January 23, 1976, 49 y.o., male Today's Date: 12/24/2024   PT End of Session - 12/24/24 0728     Visit Number 4    Number of Visits 4    Date for Recertification  02/03/25    Authorization Type [employer]    Authorization Time Period Approval for 4 pt visits from 12/09/24-01/07/25    PT Start Time 0728    PT Stop Time 0758    PT Time Calculation (min) 30 min          Past Medical History:  Diagnosis Date   ALLERGIC RHINITIS 08/27/2007   Allergic rhinitis 08/27/2007   Qualifier: Diagnosis of   By: Lilton CMA, Jenetta      IMO SNOMED Dx Update Oct 2024     Allergy    GERD 05/04/2008   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   Human immunodeficiency virus (HIV) disease (HCC) 02/27/2013   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   Past Surgical History:  Procedure Laterality Date   APPENDECTOMY     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   Diarrhea 08/10/2021   Streptococcus A carrier or suspected carrier 07/24/2019   Underinsured 07/24/2019   Dysphagia 10/23/2018   Constipation 10/23/2018   Hematospermia 11/01/2014   Scalp pruritus 07/25/2014   Itch 05/25/2014   Rash and nonspecific skin eruption 01/05/2014   Folliculitis 07/13/2013   Pain, dental 07/13/2013   Dysuria 04/29/2013   Human immunodeficiency virus (HIV) disease (HCC) 02/27/2013   GERD 05/04/2008   Allergic rhinitis 08/27/2007    PCP: Oley Bascom RAMAN, NP  REFERRING PROVIDER: Oley Bascom RAMAN, NP  THERAPY DIAG:  Cervicalgia  Left shoulder pain, unspecified chronicity  Muscle weakness  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 Yes, Number of falls: 1 feels light headed at times - slipped on water in bathroom  MOI/History of condition:  Onset date: 12/4  SUBJECTIVE STATEMENT  12/24/2024: Pt reports that his shoulder continues to have high levels of pain.  Worst at night.  EVAL: Pt is a 49 y.o. male who presents to clinic with chief complaint of L shoulder and L sided neck pain with headaches as well as bil jaw pain L>R.  Started after fall onto hard floor in bathroom when he slipped on wet floor.   Shoulder pain is worse with movement and OH reaching.  Has L sided neck pain from occiput to L shoulder.  Maybe some intermittent L UE numbness that comes sporadically for very short periods.  Also having bil jaw pain accompanied by clicking which is worse with eating and talking.      Pain:  Are you having pain? Yes Pain location: jaw pain NPRS scale:  Best: 0/10, Worst: 8/10 Aggravating factors: talking, eating Relieving factors: rest Pain description: intermittent  Are you having pain? Yes Pain location: L shoulder NPRS scale:  Best: 4-5/10, Worst: 9/10 Aggravating factors: movement Relieving factors: rest Pain description: intermittent  Occupation: NA  Assistive Device: NA  Hand Dominance: R  Patient Goals/Specific Activities:  redcue pain   OBJECTIVE:   DIAGNOSTIC FINDINGS:  CT  IMPRESSION: 1. No acute traumatic injury identified in the face. 2. Parotid gland sialolithiasis.  SENSATION: Light touch: Appears intact   PALPATION: Significant TTP bil sub occipitals, L masseter, cervical paraspinals.  Minimal TTP L temporalis   Cervical ROM  ROM ROM  (Eval)  Flexion 25*  Extension 15*  Right lateral flexion 20*  Left lateral flexion 20*  Right rotation 30*  Left rotation 32*  Flexion rotation (normal is 30 degrees)   Flexion rotation (normal is 30 degrees)      (Blank rows = not tested, N = WNL, * = concordant pain)  Shoulder flexion ROM: 120 w/ pain L, 135 R  UPPER EXTREMITY MMT:  MMT Right (Eval) Left (Eval)  Shoulder flexion    Shoulder abduction (C5)    Shoulder ER 5 3+*  Shoulder IR 5 4*  Middle trapezius    Lower trapezius    Shoulder extension    Grip strength    Shoulder shrug (C4)    Elbow flexion (C6)    Elbow ext (C7)    Thumb ext (C8)    Finger abd (T1)    Grossly     (Blank rows = not tested, score listed is out of 5 possible points.  N = WNL, D = diminished, C = clear for gross weakness with myotome testing, * = concordant pain with testing)  Mandibular AROM Observation Resting Dental Alignment/Occlusion (Overbite, underbite, overjet, crossbite): Normal overbite of 3mm Mandible Depression (40-38mm):  Mandible Protrusion (3-56mm):  Mandible Lateral Excursion (10-40mm): R: L:  Audible joint sounds (crepitus or clicking with stethoscope with opening, lateral deviation, and bite): Positive Reciprocal Click (palpation, click with both opening/closing): Positive Deviation of Mouth During Opening: Positive for L deviation Deflection of Mouth at End Range: Positive for L deviation  TMJ AROM/PROM  Active ROM  mm Eval  Depression 45  Protrusion 3  Right lateral deviation 4  Left lateral deviation 6  Right rotation   Left rotation    (Blank rows = not tested)  UE MMT:  MMT  Eval  Mandibular Depression   Mandibular Protrusion   Mandibular Elevation   Mandibular lateral dev L   Mandibular lateral dev R    (Blank rows = not tested)    HOME EXERCISE PROGRAM: Access Code: [email or web address] URL: [email or web address] Date: 12/22/2024 Prepared by: Todd Gasmen  Exercises - Standing Shoulder Row with Anchored Resistance  - 1 x daily - 7 x weekly - 2 sets - 8 reps - 3s hold - Standing Isometric Shoulder External Rotation with Doorway  - 1 x daily - 7 x weekly - 1 sets - 5 reps - 30 second hold -  Seated Scapular Retraction  - 1 x daily - 7 x weekly - 2 sets - 8 reps - 5 hold  Treatment priorities   Eval        L DDWR        Likely L RCRP                                 TODAY'S TREATMENT:  [facility] Adult PT Treatment  12/25/2023:  Therapeutic Exercise: Scapular squeezes - 2x5x5s ER - RTB 2x5x2s Blue TB row - 2x8x3s Shrug 2x4x5s - 15#  Manual Therapy  Gentle cervical traction - not helpful     ASSESSMENT:  CLINICAL IMPRESSION:  12/24/2024:  Pt reports that  his shoulder pain may be slightly improved but his jaw pain remains quite high.  L shoulder pain appears to be rotator cuff related and we are working on initial gentle loading which as been tolerated fairly well.  Continued education regarding jaw pain and encouraged him to avoid crunchy and chewy foods which has has been intermittently compliant with.  EVAL: Todd Carroll is a 49 y.o. male who presents to clinic with signs and sxs consistent with L cervical, shoulder, and TMJ pain following fall in early December.   Shoulder exam not completed in depth d/t time.  L TMJ pain consistent with DDWR.   Todd Carroll will benefit from skilled PT to address relevant deficits and improve comfort with daily tasks including talking, eating, neck movements, and reaching.   OBJECTIVE IMPAIRMENTS: Pain, mandibular ROM, shoulder ROM, cervical ROM, shoulder strength  ACTIVITY LIMITATIONS: chewing, talking, reaching, lifting, driving, reading  PERSONAL FACTORS: See medical history and pertinent history   REHAB POTENTIAL: Good  CLINICAL DECISION MAKING: Evolving/moderate complexity  EVALUATION COMPLEXITY: Moderate   GOALS:   SHORT TERM GOALS: Target date: 01/06/2025   Rafi will be >75% HEP compliant to improve carryover between sessions and facilitate independent management of condition  Evaluation: ongoing Goal status: MET   LONG TERM GOALS: Target date: 02/03/2025   Todd Carroll will self report >/= 50% decrease in  pain from evaluation to improve function in daily tasks  Evaluation/Baseline: 8-9/10 max pain (see pain in chart) 1/22: 6-10/10 Goal status: Ongoing   2.  Todd Carroll will show a >/= 12 pt improvement in their NDI score as a proxy for functional improvement  Evaluation/Baseline: 32/50 pts Goal status: INITIAL   3.  Todd Carroll will be able to eat and talk, not limited by pain  Evaluation/Baseline: limited 1/22: no significant change Goal status: Ongoing   4.  Todd Carroll will show significant improvement in cervical ROM not restricted by pain  Evaluation/Baseline:   ROM ROM  (Eval) 1/22  Flexion 25* 20*  Extension 15* 20  Right lateral flexion 20* 20*  Left lateral flexion 20* 20*  Right rotation 30* 20*  Left rotation 32* 30*  Flexion rotation (normal is 30 degrees)    Flexion rotation (normal is 30 degrees)      (Blank rows = not tested, N = WNL, * = concordant pain)  Goal status: Ongoing   5.  Todd Carroll will be able to reach repeatedly to first cabinet shelf with weight equivalent to plate, not limited by pain  Evaluation/Baseline: limited 1/22: able to reach with some pain, no weight Goal status: Ongoing     PLAN: PT FREQUENCY: 1-2x/week  PT DURATION: 8 weeks  PLANNED INTERVENTIONS:  97164- PT Re-evaluation, 97110-Therapeutic exercises, 97530- Therapeutic activity, 97112- Neuromuscular re-education, 97535- Self Care, 02859- Manual therapy, [id number]- Gait training, [id number]- Aquatic Therapy, [phone]- Electrical stimulation (manual), [id number]- Vasopneumatic device, [id number]- Traction (mechanical), [id number]- Ionotophoresis 4mg /ml Dexamethasone , Taping, Dry Needling, Joint manipulation, and Spinal manipulation.   Todd Carroll PT 12/24/2024, 8:48 AM  I just finished a MCD eval/recert.  Name: Todd Carroll  MRN: [id number] Please request 2x/week for 8 weeks.  Check all conditions that are expected to impact treatment: Musculoskeletal disorders   I DID put a  charge in.  Check all possible CPT codes: 02889- Therapeutic Exercise, [phone]- Neuro Re-education, [phone] - Gait Training, [phone] - Manual Therapy, 97530 - Therapeutic Activities, 97535 - Self Care, [phone] - Re-evaluation, [id number] - Mechanical traction, and [phone] - Aquatic therapy   Thank you!  MCD -  Secure     "

## 2024-12-25 ENCOUNTER — Other Ambulatory Visit (HOSPITAL_COMMUNITY): Payer: Self-pay

## 2024-12-25 ENCOUNTER — Ambulatory Visit (INDEPENDENT_AMBULATORY_CARE_PROVIDER_SITE_OTHER): Admitting: Internal Medicine

## 2024-12-25 ENCOUNTER — Ambulatory Visit: Admitting: Internal Medicine

## 2024-12-25 ENCOUNTER — Encounter: Payer: Self-pay | Admitting: Internal Medicine

## 2024-12-25 ENCOUNTER — Other Ambulatory Visit (HOSPITAL_COMMUNITY)
Admission: RE | Admit: 2024-12-25 | Discharge: 2024-12-25 | Disposition: A | Source: Ambulatory Visit | Attending: Internal Medicine | Admitting: Internal Medicine

## 2024-12-25 ENCOUNTER — Other Ambulatory Visit: Payer: Self-pay

## 2024-12-25 VITALS — BP 123/90 | HR 103 | Ht 73.0 in | Wt 245.0 lb

## 2024-12-25 DIAGNOSIS — B20 Human immunodeficiency virus [HIV] disease: Secondary | ICD-10-CM | POA: Insufficient documentation

## 2024-12-25 DIAGNOSIS — Z79899 Other long term (current) drug therapy: Secondary | ICD-10-CM | POA: Diagnosis present

## 2024-12-25 DIAGNOSIS — K219 Gastro-esophageal reflux disease without esophagitis: Secondary | ICD-10-CM

## 2024-12-25 DIAGNOSIS — E785 Hyperlipidemia, unspecified: Secondary | ICD-10-CM

## 2024-12-25 MED ORDER — CLINDAMYCIN PHOSPHATE 1 % EX LOTN
TOPICAL_LOTION | Freq: Two times a day (BID) | CUTANEOUS | 3 refills | Status: AC
  Filled 2024-12-25: qty 60, 30d supply, fill #0

## 2024-12-25 MED ORDER — BIKTARVY 50-200-25 MG PO TABS
1.0000 | ORAL_TABLET | Freq: Every day | ORAL | 4 refills | Status: AC
  Filled 2024-12-25: qty 90, 90d supply, fill #0

## 2024-12-25 NOTE — Progress Notes (Signed)
 "     RFV: follow up for hiv disease  Patient ID: Todd Carroll, male   DOB: 11/26/1976, 49 y.o.   MRN: [id number]  HPI 49yo M with HIV disease, CD 4 count of 1007, VL<20 in jan 2026, currently on biktarvy . N.ot missing doses  Had a fall in December. Doing physical therapy to left shoulder. And jaw injury. Not significantly improving.  Outpatient Encounter Medications as of 12/25/2024  Medication Sig   albuterol  (VENTOLIN  HFA) 108 (90 Base) MCG/ACT inhaler Inhale 1-2 puffs into the lungs every 6 (six) hours as needed for wheezing or shortness of breath.   bictegravir-emtricitabine -tenofovir  AF (BIKTARVY ) 50-200-25 MG TABS tablet Take 1 tablet by mouth daily. CALL [phone] FOR AN APPOINTMENT   Clindamycin  Phos, Once-Daily, (CLINDAGEL ) 1 % GEL Apply 1 Dose topically as needed.   cyclobenzaprine  (FLEXERIL ) 10 MG tablet Take 1 tablet (10 mg total) by mouth 3 (three) times daily as needed for muscle spasms.   cycloSPORINE  (RESTASIS ) 0.05 % ophthalmic emulsion Place 1 drop into both eyes 2 (two) times daily.   diclofenac  Sodium (VOLTAREN ) 1 % GEL Apply 2 grams topically 4 (four) times daily.   doxycycline  (VIBRA -TABS) 100 MG tablet Take 1 tablet (100 mg total) by mouth 2 (two) times daily.   hydrOXYzine  (VISTARIL ) 50 MG capsule TAKE 1 CAPSULE(50 MG) BY MOUTH THREE TIMES DAILY AS NEEDED   hydrOXYzine  (VISTARIL ) 50 MG capsule Take 1 capsule (50 mg total) by mouth 3 (three) times daily as needed.   Lifitegrast  (XIIDRA ) 5 % SOLN Place 1 drop into both eyes 2 (two) times daily.   meloxicam  (MOBIC ) 15 MG tablet Take 1 tablet (15 mg total) by mouth daily.   moxifloxacin  (VIGAMOX ) 0.5 % ophthalmic solution Place 1 drop into both eyes 3 (three) times daily.   mupirocin  ointment (BACTROBAN ) 2 % Apply 1 Application topically 2 (two) times daily. Apply small amount inside nose   omeprazole  (PRILOSEC) 40 MG capsule Take 1 capsule (40 mg total) by mouth daily as needed.   rosuvastatin  (CRESTOR ) 10 MG  tablet Take 1 tablet (10 mg total) by mouth daily.   Sodium Fluoride  (PREVIDENT  5000 BOOSTER PLUS) 1.1 % PSTE USE AS DIRECTED: BRUSH ONTO TEETH FOR NIGHT TIME MOUTH CARE . SPIT OUT EXCESS. DO NOT RINSE.   sodium fluoride  (PREVIDENT  5000 PLUS) 1.1 % CREA dental cream Use a pea sized amount of this toothpaste in the evening   tiZANidine  (ZANAFLEX ) 4 MG tablet Take 1 tablet (4 mg total) by mouth 2 (two) times daily as needed.   triamcinolone  (KENALOG ) 0.025 % ointment Apply topically 2 (two) times daily.   triamcinolone  ointment (KENALOG ) 0.5 % Apply 1 Application topically 2 (two) times daily.   valACYclovir  (VALTREX ) 500 MG tablet Take 1 tablet (500 mg total) by mouth daily.   Dentifrices (LUMINEUX WHITENING TOOTHPASTE) PSTE Place 1 Application onto teeth daily.   fexofenadine  (ALLEGRA  ALLERGY) 180 MG tablet Take 1 tablet (180 mg total) by mouth daily. (Patient not taking: Reported on 12/07/2024)   lidocaine  (LIDODERM ) 5 % Place 1 patch onto the skin daily. Remove & Discard patch within 12 hours or as directed by MD (Patient not taking: Reported on 12/07/2024)   multivitamin (ONE-A-DAY MEN'S) TABS tablet Take 1 tablet by mouth daily.   sodium chloride  (OCEAN) 0.65 % SOLN nasal spray Place 2 sprays into both nostrils in the morning, at noon, and at bedtime.   No facility-administered encounter medications on file as of 12/25/2024.     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   Diarrhea 08/10/2021   Streptococcus A carrier or suspected carrier 07/24/2019   Underinsured 07/24/2019   Dysphagia 10/23/2018   Constipation 10/23/2018   Hematospermia 11/01/2014   Scalp pruritus 07/25/2014   Itch 05/25/2014   Rash and nonspecific skin eruption 01/05/2014   Folliculitis 07/13/2013   Pain, dental 07/13/2013   Dysuria 04/29/2013   Human  immunodeficiency virus (HIV) disease (HCC) 02/27/2013   GERD 05/04/2008   Allergic rhinitis 08/27/2007     Health Maintenance Due  Topic Date Due   HPV VACCINES (3 - Risk male 3-dose series) 11/14/2018   Fecal DNA (Cologuard)  Never done     Review of Systems 12 point ros is otherwise negative Physical Exam   BP (!) 123/90   Pulse (!) 103   Ht 6' 1 (1.854 m)   Wt 245 lb (111.1 kg)   BMI 32.32 kg/m   Physical Exam  Constitutional: He is oriented to person, place, and time. He appears well-developed and well-nourished. No distress.  HENT:  Mouth/Throat: Oropharynx is clear and moist. No oropharyngeal exudate.  Cardiovascular: Normal rate, regular rhythm and normal heart sounds. Exam reveals no gallop and no friction rub.  No murmur heard.  Pulmonary/Chest: Effort normal and breath sounds normal. No respiratory distress. He has no wheezes.  Abdominal: Soft. Bowel sounds are normal. He exhibits no distension. There is no tenderness.  Lymphadenopathy:  He has no cervical adenopathy.  Neurological: He is alert and oriented to person, place, and time.  Skin: Skin is warm and dry. No rash noted. No erythema.  Psychiatric: He has a normal mood and affect. His behavior is normal.   Lab Results  Component Value Date   CD4TCELL 42 12/09/2024   Lab Results  Component Value Date   CD4TABS 1,006 12/09/2024   CD4TABS 713 02/04/2023   CD4TABS 1,210 08/03/2021   Lab Results  Component Value Date   HIV1RNAQUANT NOT DETECTED 12/09/2024   Lab Results  Component Value Date   HEPBSAB NON-REACTIVE 04/16/2024   Lab Results  Component Value Date   LABRPR NON-REACTIVE 12/09/2024    CBC Lab Results  Component Value Date   WBC 4.9 12/09/2024   RBC 4.20 12/09/2024   HGB 12.7 (L) 12/09/2024   HCT 38.3 (L) 12/09/2024   PLT 344 12/09/2024   MCV 91.2 12/09/2024   MCH 30.2 12/09/2024   MCHC 33.2 12/09/2024   RDW 13.2 12/09/2024   LYMPHSABS 2,373 02/04/2023   MONOABS 553  07/02/2017   EOSABS 230 12/09/2024    BMET Lab Results  Component Value Date   NA 137 12/09/2024   K 4.3 12/09/2024   CL 103 12/09/2024   CO2 28 12/09/2024   GLUCOSE 106 (H) 12/09/2024   BUN 14 12/09/2024   CREATININE 1.27 12/09/2024   CALCIUM  9.0 12/09/2024   GFRNONAA 85 11/08/2020   GFRAA 98 11/08/2020      Assessment and Plan  Hiv = reviewed labs that showed to be well controlled. gave refillsfor biktarvy   Long term medication management = cr is stable  Dyslipidemia = doing diet modification  GERD = well controlled on omeprazole . Continue to use as needed.   "

## 2024-12-26 ENCOUNTER — Other Ambulatory Visit: Payer: Self-pay | Admitting: Podiatry

## 2024-12-26 LAB — URINE CYTOLOGY ANCILLARY ONLY
Chlamydia: NEGATIVE
Comment: NEGATIVE
Comment: NORMAL
Neisseria Gonorrhea: NEGATIVE

## 2024-12-28 ENCOUNTER — Other Ambulatory Visit: Payer: Self-pay

## 2024-12-28 ENCOUNTER — Encounter: Payer: Self-pay | Admitting: Physical Therapy

## 2024-12-28 ENCOUNTER — Other Ambulatory Visit (HOSPITAL_COMMUNITY): Payer: Self-pay

## 2024-12-29 ENCOUNTER — Encounter (HOSPITAL_COMMUNITY): Payer: Self-pay

## 2024-12-29 ENCOUNTER — Other Ambulatory Visit (HOSPITAL_COMMUNITY): Payer: Self-pay

## 2024-12-29 ENCOUNTER — Ambulatory Visit: Admitting: Physical Therapy

## 2024-12-29 ENCOUNTER — Other Ambulatory Visit: Payer: Self-pay

## 2024-12-30 ENCOUNTER — Other Ambulatory Visit (HOSPITAL_COMMUNITY): Payer: Self-pay

## 2024-12-30 ENCOUNTER — Other Ambulatory Visit: Payer: Self-pay

## 2024-12-30 ENCOUNTER — Encounter (HOSPITAL_COMMUNITY): Payer: Self-pay

## 2024-12-30 ENCOUNTER — Encounter: Payer: Self-pay | Admitting: Internal Medicine

## 2024-12-31 ENCOUNTER — Ambulatory Visit (INDEPENDENT_AMBULATORY_CARE_PROVIDER_SITE_OTHER): Admitting: Audiology

## 2024-12-31 ENCOUNTER — Other Ambulatory Visit: Payer: Self-pay

## 2024-12-31 ENCOUNTER — Encounter: Payer: Self-pay | Admitting: Physical Therapy

## 2024-12-31 ENCOUNTER — Ambulatory Visit: Admitting: Physical Therapy

## 2024-12-31 ENCOUNTER — Other Ambulatory Visit (HOSPITAL_COMMUNITY): Payer: Self-pay

## 2024-12-31 ENCOUNTER — Ambulatory Visit (INDEPENDENT_AMBULATORY_CARE_PROVIDER_SITE_OTHER)

## 2024-12-31 VITALS — BP 126/88 | HR 86 | Wt 245.0 lb

## 2024-12-31 DIAGNOSIS — M26609 Unspecified temporomandibular joint disorder, unspecified side: Secondary | ICD-10-CM | POA: Diagnosis not present

## 2024-12-31 DIAGNOSIS — H9311 Tinnitus, right ear: Secondary | ICD-10-CM

## 2024-12-31 DIAGNOSIS — R1319 Other dysphagia: Secondary | ICD-10-CM

## 2024-12-31 DIAGNOSIS — K219 Gastro-esophageal reflux disease without esophagitis: Secondary | ICD-10-CM

## 2024-12-31 DIAGNOSIS — M542 Cervicalgia: Secondary | ICD-10-CM

## 2024-12-31 DIAGNOSIS — Z0110 Encounter for examination of ears and hearing without abnormal findings: Secondary | ICD-10-CM

## 2024-12-31 DIAGNOSIS — H9202 Otalgia, left ear: Secondary | ICD-10-CM

## 2024-12-31 DIAGNOSIS — H9192 Unspecified hearing loss, left ear: Secondary | ICD-10-CM

## 2024-12-31 DIAGNOSIS — H918X9 Other specified hearing loss, unspecified ear: Secondary | ICD-10-CM

## 2024-12-31 DIAGNOSIS — J3489 Other specified disorders of nose and nasal sinuses: Secondary | ICD-10-CM

## 2024-12-31 DIAGNOSIS — M25512 Pain in left shoulder: Secondary | ICD-10-CM

## 2024-12-31 DIAGNOSIS — R42 Dizziness and giddiness: Secondary | ICD-10-CM

## 2024-12-31 DIAGNOSIS — H93293 Other abnormal auditory perceptions, bilateral: Secondary | ICD-10-CM

## 2024-12-31 DIAGNOSIS — M6281 Muscle weakness (generalized): Secondary | ICD-10-CM

## 2024-12-31 DIAGNOSIS — H918X3 Other specified hearing loss, bilateral: Secondary | ICD-10-CM

## 2024-12-31 MED ORDER — OMEPRAZOLE 40 MG PO CPDR
40.0000 mg | DELAYED_RELEASE_CAPSULE | Freq: Two times a day (BID) | ORAL | 1 refills | Status: AC
  Filled 2024-12-31 – 2025-01-08 (×6): qty 90, 45d supply, fill #0
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
## 2024-12-31 NOTE — Progress Notes (Signed)
 " [address], [address] [place], [place] [address] [phone]  Audiological Evaluation    Name: Todd Carroll     DOB:   October 31, 1976      MRN:   [id number]                                                                                     Service Date: 12/31/2024     Accompanied by: self   Patient comes today after Dr. Anice, ENT sent a referral for a hearing evaluation due to concerns with hearing loss.   Symptoms Yes Details  Hearing loss  [x]  No hearing  in the left ear- noticed more than 10 years ago and concerns with limited hearing in the right ear  Tinnitus  []  sometimes  Ear pain/ infections/pressure  []    Balance problems  []    Noise exposure history  []    Previous ear surgeries  []    Family history of hearing loss  []    Amplification  []    Other  [x]  Clemens to his left side    Otoscopy: Right ear: clear external ear canal and notable landmarks visualized on the tympanic membrane. Left ear:  clear external ear canal and notable landmarks visualized on the tympanic membrane.  Tympanometry: Right ear: Type A - Normal external ear canal volume with normal middle ear pressure and normal tympanic membrane compliance. Findings are consistent with normal middle ear function. Left ear: Type A - Normal external ear canal volume with normal middle ear pressure and normal tympanic membrane compliance. Findings are consistent with normal middle ear function.  Distortion Product Otoacoustic Emissions: Frequencies tested 1.6-8kHz - Diagnostic Test (12 frequencies)   Results are considered present if DP-NF is above 6dB SPL and DP is above -10dB SPL.  Right ear: Present from 2000 Hz -4500 Hz and absent at all other tested frequencies.  Where absent is suggestive of abnormal outer hair cell function for that frequency range. Left ear: Attempted running DPOAE's  and the equipment stopped working (battery error) before testing the left ear.     Hearing Evaluation  The  hearing test results were completed under headphones and re-checked with inserts and results are deemed to be of poor reliability. Test technique:  conventional    Pure tone Audiometry:  Patient was explained that it would be in his best interested to repeat the hearing test given to poor inter-test reliability.   Right ear- Best responses obtained today were at a moderate to severe from 125 Hz - 8000 Hz. Unable to determine type  Left ear-  Best responses obtained today were at a severe to  suggestive sensorineural hearing loss from 125 Hz - 8000 Hz.  Speech Audiometry: Right ear- Speech Reception Threshold (SRT) was obtained at 75 dBHL. Left ear-Speech Awareness Threshold (SAT) was observed at 100 dBHL, reported as vibrations.   Word Recognition Score Tested using NU-6 (recorded) Right ear: 56% was obtained at a presentation level of 95 dBHL with contralateral masking which is deemed as  poor. Left ear: Did not test due to reported inability to hear words.   Impression: Patient needs to have the hearing test  repeated in order to make proper recommendation for his hearing care.  Recommendations: Follow up with ENT as scheduled. Repeat audiogram in 1-2 months.    Todd Carroll MARIE LEROUX-MARTINEZ, AUD  "

## 2024-12-31 NOTE — Therapy (Signed)
 " OUTPATIENT PHYSICAL THERAPY DAILY NOTE   Patient Name: Todd Carroll MRN: [id number] DOB:1976/10/19, 49 y.o., male Today's Date: 12/31/2024   PT End of Session - 12/31/24 0714     Visit Number 5    Number of Visits 8    Date for Recertification  02/03/25    Authorization Type [employer]    Authorization Time Period Approval for 4 pt visits from 12/09/24-01/07/25, Approved 4 PT visits from 12/31/24-01/29/25    PT Start Time 0715    PT Stop Time 0755    PT Time Calculation (min) 40 min          Past Medical History:  Diagnosis Date   ALLERGIC RHINITIS 08/27/2007   Allergic rhinitis 08/27/2007   Qualifier: Diagnosis of   By: Lilton CMA, Jenetta      IMO SNOMED Dx Update Oct 2024     Allergy    GERD 05/04/2008   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   Human immunodeficiency virus (HIV) disease (HCC) 02/27/2013   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   Past Surgical History:  Procedure Laterality Date   APPENDECTOMY     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   Diarrhea 08/10/2021   Streptococcus A carrier or suspected carrier 07/24/2019   Underinsured 07/24/2019   Dysphagia 10/23/2018   Constipation 10/23/2018   Hematospermia 11/01/2014   Scalp pruritus 07/25/2014   Itch 05/25/2014   Rash and nonspecific skin eruption 01/05/2014   Folliculitis 07/13/2013   Pain, dental 07/13/2013   Dysuria 04/29/2013   Human immunodeficiency virus (HIV) disease (HCC) 02/27/2013   GERD 05/04/2008   Allergic rhinitis 08/27/2007    PCP: Oley Bascom RAMAN, NP  REFERRING PROVIDER: Oley Bascom RAMAN, NP  THERAPY DIAG:  Cervicalgia  Left shoulder pain, unspecified chronicity  Muscle weakness  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 Yes, Number of falls: 1 feels light headed at times - slipped on water in bathroom  MOI/History of condition:  Onset date: 12/4  SUBJECTIVE STATEMENT  12/31/2024: Pt reports that he continues to have high levels of pain.  EVAL: Pt is a 49 y.o. male who presents to clinic with chief complaint of L shoulder and L sided neck pain with headaches as well as bil jaw pain L>R.  Started after fall onto hard floor in bathroom when he slipped on wet floor.   Shoulder pain is worse with movement and OH reaching.  Has L sided neck pain from occiput to L shoulder.  Maybe some intermittent L UE numbness that comes sporadically for very short periods.  Also having bil jaw pain accompanied by clicking which is worse with eating and talking.      Pain:  Are you having pain? Yes Pain location: jaw pain NPRS scale:  Best: 0/10, Worst: 8/10 Aggravating factors: talking, eating Relieving factors: rest Pain description: intermittent  Are you having pain? Yes Pain location: L shoulder NPRS scale:  Best: 4-5/10, Worst: 9/10 Aggravating factors: movement Relieving factors: rest Pain description: intermittent  Occupation: NA  Assistive Device: NA  Hand Dominance: R  Patient Goals/Specific  Activities: redcue pain   OBJECTIVE:   DIAGNOSTIC FINDINGS:  CT  IMPRESSION: 1. No acute traumatic injury identified in the face. 2. Parotid gland sialolithiasis.  SENSATION: Light touch: Appears intact   PALPATION: Significant TTP bil sub occipitals, L masseter, cervical paraspinals.  Minimal TTP L temporalis   Cervical ROM  ROM ROM  (Eval)  Flexion 25*  Extension 15*  Right lateral flexion 20*  Left lateral flexion 20*  Right rotation 30*  Left rotation 32*  Flexion rotation (normal is 30 degrees)   Flexion rotation  (normal is 30 degrees)     (Blank rows = not tested, N = WNL, * = concordant pain)  Shoulder flexion ROM: 120 w/ pain L, 135 R  UPPER EXTREMITY MMT:  MMT Right (Eval) Left (Eval)  Shoulder flexion    Shoulder abduction (C5)    Shoulder ER 5 3+*  Shoulder IR 5 4*  Middle trapezius    Lower trapezius    Shoulder extension    Grip strength    Shoulder shrug (C4)    Elbow flexion (C6)    Elbow ext (C7)    Thumb ext (C8)    Finger abd (T1)    Grossly     (Blank rows = not tested, score listed is out of 5 possible points.  N = WNL, D = diminished, C = clear for gross weakness with myotome testing, * = concordant pain with testing)  Mandibular AROM Observation Resting Dental Alignment/Occlusion (Overbite, underbite, overjet, crossbite): Normal overbite of 3mm Mandible Depression (40-61mm):  Mandible Protrusion (3-58mm):  Mandible Lateral Excursion (10-32mm): R: L:  Audible joint sounds (crepitus or clicking with stethoscope with opening, lateral deviation, and bite): Positive Reciprocal Click (palpation, click with both opening/closing): Positive Deviation of Mouth During Opening: Positive for L deviation Deflection of Mouth at End Range: Positive for L deviation  TMJ AROM/PROM  Active ROM  mm Eval  Depression 45  Protrusion 3  Right lateral deviation 4  Left lateral deviation 6  Right rotation   Left rotation    (Blank rows = not tested)  UE MMT:  MMT  Eval  Mandibular Depression   Mandibular Protrusion   Mandibular Elevation   Mandibular lateral dev L   Mandibular lateral dev R    (Blank rows = not tested)    HOME EXERCISE PROGRAM: Access Code: [email or web address] URL: [email or web address] Date: 12/22/2024 Prepared by: Helene Gasmen  Exercises - Standing Shoulder Row with Anchored Resistance  - 1 x daily - 7 x weekly - 2 sets - 8 reps - 3s hold - Standing Isometric Shoulder External Rotation with Doorway  - 1 x daily - 7 x weekly - 1 sets - 5  reps - 30 second hold - Seated Scapular Retraction  - 1 x daily - 7 x weekly - 2 sets - 8 reps - 5 hold  Treatment priorities   Eval        L DDWR        Likely L RCRP                                 TODAY'S TREATMENT:  [facility] Adult PT Treatment  01/01/2024:  Therapeutic Exercise: Scapular squeezes - 2x5x5s ER - RTB 3x4x2s Blue TB row - 2x8x3s Shrug 2x5x5s - 5# Jaw opening in comfortable range  Neuromuscular re-ed: Supine chin tuck in comfortable range - 2x10x3s  Manual Therapy  STM masseter, temporalis,  L UT, cervical paraspinals     ASSESSMENT:  CLINICAL IMPRESSION:  12/31/2024:  Pt continues to be limited by high levels of pain in both his jaw and shoulder.  TMJ pain remains significant with chewing.  Significant deviation with reduction on opening.  Pt has not responded well to gentle ROM which seems to only aggravate sxs.  Continued to educate on load management which pt reports he is semi compliant with (limiting opening which causes deviation and avoiding chewy or crunchy food) pt will return to ENT today.  EVAL: San is a 49 y.o. male who presents to clinic with signs and sxs consistent with L cervical, shoulder, and TMJ pain following fall in early December.   Shoulder exam not completed in depth d/t time.  L TMJ pain consistent with DDWR.   Saiquan will benefit from skilled PT to address relevant deficits and improve comfort with daily tasks including talking, eating, neck movements, and reaching.   OBJECTIVE IMPAIRMENTS: Pain, mandibular ROM, shoulder ROM, cervical ROM, shoulder strength  ACTIVITY LIMITATIONS: chewing, talking, reaching, lifting, driving, reading  PERSONAL FACTORS: See medical history and pertinent history   REHAB POTENTIAL: Good  CLINICAL DECISION MAKING: Evolving/moderate complexity  EVALUATION COMPLEXITY: Moderate   GOALS:   SHORT TERM GOALS: Target date: 01/06/2025   Haydon will be >75% HEP compliant to improve  carryover between sessions and facilitate independent management of condition  Evaluation: ongoing Goal status: MET   LONG TERM GOALS: Target date: 02/03/2025   Kden will self report >/= 50% decrease in pain from evaluation to improve function in daily tasks  Evaluation/Baseline: 8-9/10 max pain (see pain in chart) 1/22: 6-10/10 Goal status: Ongoing   2.  Jerrit will show a >/= 12 pt improvement in their NDI score as a proxy for functional improvement  Evaluation/Baseline: 32/50 pts Goal status: INITIAL   3.  Talal will be able to eat and talk, not limited by pain  Evaluation/Baseline: limited 1/22: no significant change Goal status: Ongoing   4.  Wassim will show significant improvement in cervical ROM not restricted by pain  Evaluation/Baseline:   ROM ROM  (Eval) 1/22  Flexion 25* 20*  Extension 15* 20  Right lateral flexion 20* 20*  Left lateral flexion 20* 20*  Right rotation 30* 20*  Left rotation 32* 30*  Flexion rotation (normal is 30 degrees)    Flexion rotation (normal is 30 degrees)      (Blank rows = not tested, N = WNL, * = concordant pain)  Goal status: Ongoing   5.  Marzell will be able to reach repeatedly to first cabinet shelf with weight equivalent to plate, not limited by pain  Evaluation/Baseline: limited 1/22: able to reach with some pain, no weight Goal status: Ongoing     PLAN: PT FREQUENCY: 1-2x/week  PT DURATION: 8 weeks  PLANNED INTERVENTIONS:  97164- PT Re-evaluation, 97110-Therapeutic exercises, 97530- Therapeutic activity, 97112- Neuromuscular re-education, 97535- Self Care, 02859- Manual therapy, [id number]- Gait training, [id number]- Aquatic Therapy, [phone]- Electrical stimulation (manual), [id number]- Vasopneumatic device, [id number]- Traction (mechanical), [id number]- Ionotophoresis 4mg /ml Dexamethasone , Taping, Dry Needling, Joint manipulation, and Spinal manipulation.   Helene BRAVO Lindzey Zent PT 12/31/2024, 8:02 AM  I  just finished a MCD eval/recert.  Name: DEMETRICE COMBES  MRN: [id number] Please request 2x/week for 8 weeks.  Check all conditions that are expected to impact treatment: Musculoskeletal disorders   I DID put a charge in.  Check all possible CPT codes: 02889- Therapeutic Exercise, [phone]- Neuro Re-education, [phone] -  Gait Training, 02859 - Manual Therapy, [id number] - Therapeutic Activities, [id number] - Self Care, [phone] - Re-evaluation, [id number] - Mechanical traction, and [id number] - Aquatic therapy   Thank you!  MCD - Secure     "

## 2024-12-31 NOTE — Patient Instructions (Signed)
" °  VISIT SUMMARY: During your visit today, we discussed your chronic left-sided hearing loss, nasal septal perforation, and oropharyngeal symptoms. We also addressed your temporomandibular joint pain, gastroesophageal reflux disease, and dizziness. We have made some adjustments to your treatment plan and scheduled follow-up tests to further evaluate your conditions.  YOUR PLAN: -HEARING LOSS, LEFT EAR: You have chronic, profound hearing loss in your left ear with increased sensitivity and itching. We have ordered a repeat hearing test in two months and scheduled a follow-up visit to discuss the results.  -TEMPOROMANDIBULAR JOINT DISORDER: You have persistent jaw pain that has not improved with physical therapy. We discussed the use of a bite guard to help alleviate your symptoms and recommend continuing physical therapy.  -PHARYNGOESOPHAGEAL DYSPHAGIA: You have difficulty swallowing with intermittent pain. We have increased your omeprazole  dosage to twice daily before breakfast and dinner and referred you to a gastroenterologist for further evaluation. Please avoid spicy foods and red sauces as part of your dietary guidance.  -NASAL SEPTAL PERFORATION WITH CRUSTING: You have a nasal septal perforation with associated crusting. Continue using the topical antibiotic cream and nasal spray as directed. We discussed the possibility of a future biopsy if needed.  -TINNITUS, RIGHT EAR: You have persistent ringing in your right ear with dizziness and a vibratory sensation. We have planned a repeat hearing test in two months and ordered an MRI of your brain to check for central causes.  -DIZZINESS: You experience ongoing dizziness, which may be related to auditory or neurological issues. We have ordered an MRI of your brain and planned a repeat hearing test in two months to further investigate the cause.  -GASTROESOPHAGEAL REFLUX DISEASE: Your GERD symptoms persist despite your current medication regimen. We  have increased your omeprazole  dosage to twice daily before breakfast and dinner, provided dietary guidance to avoid spicy foods and red sauces, and ensured your prescription coverage for a 90-day supply and refills.  INSTRUCTIONS: Please follow up in two months for a repeat hearing test and visit. Additionally, follow up with the gastroenterologist as referred for further evaluation of your swallowing difficulties. Continue your current medications and follow the dietary guidance provided. If you experience any new or worsening symptoms, please contact our office.    Contains text generated by Abridge.   "

## 2025-01-01 ENCOUNTER — Other Ambulatory Visit: Payer: Self-pay

## 2025-01-01 ENCOUNTER — Other Ambulatory Visit: Payer: Self-pay | Admitting: Nurse Practitioner

## 2025-01-01 MED ORDER — MOXIFLOXACIN HCL 0.5 % OP SOLN
1.0000 [drp] | Freq: Three times a day (TID) | OPHTHALMIC | 0 refills | Status: AC
  Filled 2025-01-01 – 2025-01-02 (×2): qty 3, 20d supply, fill #0
  Filled 2025-01-02: qty 3, 10d supply, fill #0
  Filled 2025-01-05: qty 3, 20d supply, fill #0

## 2025-01-01 NOTE — Telephone Encounter (Signed)
 moxifloxacin (VIGAMOX) 0.5 % ophthalmic solution

## 2025-01-01 NOTE — Telephone Encounter (Signed)
 Please Advise.  CB.

## 2025-01-02 ENCOUNTER — Other Ambulatory Visit: Payer: Self-pay | Admitting: Physician Assistant

## 2025-01-02 ENCOUNTER — Other Ambulatory Visit (HOSPITAL_COMMUNITY): Payer: Self-pay

## 2025-01-03 ENCOUNTER — Other Ambulatory Visit (HOSPITAL_COMMUNITY): Payer: Self-pay

## 2025-01-03 ENCOUNTER — Other Ambulatory Visit: Payer: Self-pay

## 2025-01-03 ENCOUNTER — Other Ambulatory Visit (HOSPITAL_BASED_OUTPATIENT_CLINIC_OR_DEPARTMENT_OTHER): Payer: Self-pay

## 2025-01-03 MED ORDER — MELOXICAM 15 MG PO TABS
15.0000 mg | ORAL_TABLET | Freq: Every day | ORAL | 2 refills | Status: DC
  Filled 2025-01-03: qty 14, 14d supply, fill #0

## 2025-01-04 ENCOUNTER — Other Ambulatory Visit (HOSPITAL_COMMUNITY): Payer: Self-pay

## 2025-01-04 ENCOUNTER — Encounter (INDEPENDENT_AMBULATORY_CARE_PROVIDER_SITE_OTHER): Payer: Self-pay

## 2025-01-04 ENCOUNTER — Other Ambulatory Visit: Payer: Self-pay

## 2025-01-04 ENCOUNTER — Encounter: Payer: Self-pay | Admitting: Orthopaedic Surgery

## 2025-01-04 ENCOUNTER — Encounter (HOSPITAL_COMMUNITY): Payer: Self-pay

## 2025-01-04 ENCOUNTER — Other Ambulatory Visit (HOSPITAL_BASED_OUTPATIENT_CLINIC_OR_DEPARTMENT_OTHER): Payer: Self-pay

## 2025-01-04 DIAGNOSIS — K219 Gastro-esophageal reflux disease without esophagitis: Secondary | ICD-10-CM

## 2025-01-04 DIAGNOSIS — R1311 Dysphagia, oral phase: Secondary | ICD-10-CM

## 2025-01-05 ENCOUNTER — Other Ambulatory Visit: Payer: Self-pay

## 2025-01-05 ENCOUNTER — Encounter: Payer: Self-pay | Admitting: Physical Therapy

## 2025-01-05 ENCOUNTER — Ambulatory Visit: Admitting: Physical Therapy

## 2025-01-05 ENCOUNTER — Other Ambulatory Visit (HOSPITAL_COMMUNITY): Payer: Self-pay

## 2025-01-05 DIAGNOSIS — M542 Cervicalgia: Secondary | ICD-10-CM

## 2025-01-05 DIAGNOSIS — M25512 Pain in left shoulder: Secondary | ICD-10-CM

## 2025-01-05 DIAGNOSIS — M6281 Muscle weakness (generalized): Secondary | ICD-10-CM

## 2025-01-05 MED ORDER — MELOXICAM 15 MG PO TABS
15.0000 mg | ORAL_TABLET | Freq: Every day | ORAL | 0 refills | Status: AC
  Filled 2025-01-05: qty 30, 30d supply, fill #0

## 2025-01-05 NOTE — Telephone Encounter (Signed)
 Lvm for pt to go to the nearest ED  to be seen. KH

## 2025-01-05 NOTE — Telephone Encounter (Signed)
 Order placed

## 2025-01-05 NOTE — Telephone Encounter (Signed)
 Please order MRI of the left shoulder. Thanks.

## 2025-01-06 ENCOUNTER — Other Ambulatory Visit: Payer: Self-pay

## 2025-01-06 ENCOUNTER — Telehealth (INDEPENDENT_AMBULATORY_CARE_PROVIDER_SITE_OTHER): Payer: Self-pay

## 2025-01-06 ENCOUNTER — Other Ambulatory Visit (HOSPITAL_COMMUNITY): Payer: Self-pay

## 2025-01-06 MED ORDER — PREVIDENT 5000 BOOSTER PLUS 1.1 % DT PSTE
PASTE | DENTAL | 10 refills | Status: AC
  Filled 2025-01-06 (×2): qty 100, 30d supply, fill #0

## 2025-01-06 NOTE — Telephone Encounter (Signed)
 The patient called reporting that his PT provider mentioned that he should have imaging done of his jaw as well when he is getting his MRI.  I told that the MRI Dr Anice has order is of his head/brain.  But, as I could not confirm his jaw would be focused on, he requested dr Knight's medical assistant to call him back to discuss this.  The patient did not mention what the physical therapist wanted checked about his jaw.

## 2025-01-06 NOTE — Telephone Encounter (Signed)
 I spoke with patient about concerns of MRI getting in his jaw. PT person said he should have his jaws looked at for TMJ. I informed patient that I will call Radiology tomorrow morning and ask them about MRI getting his jaws in and I will call him back.

## 2025-01-07 ENCOUNTER — Other Ambulatory Visit (HOSPITAL_COMMUNITY): Payer: Self-pay

## 2025-01-07 ENCOUNTER — Ambulatory Visit: Payer: Self-pay | Admitting: Nurse Practitioner

## 2025-01-07 ENCOUNTER — Encounter: Payer: Self-pay | Admitting: Nurse Practitioner

## 2025-01-07 ENCOUNTER — Ambulatory Visit: Admitting: Physical Therapy

## 2025-01-07 ENCOUNTER — Other Ambulatory Visit: Payer: Self-pay

## 2025-01-07 VITALS — BP 103/80 | HR 102 | Wt 244.0 lb

## 2025-01-07 DIAGNOSIS — R079 Chest pain, unspecified: Secondary | ICD-10-CM

## 2025-01-07 DIAGNOSIS — R300 Dysuria: Secondary | ICD-10-CM | POA: Diagnosis not present

## 2025-01-07 DIAGNOSIS — I517 Cardiomegaly: Secondary | ICD-10-CM | POA: Diagnosis not present

## 2025-01-07 DIAGNOSIS — R200 Anesthesia of skin: Secondary | ICD-10-CM | POA: Diagnosis not present

## 2025-01-07 DIAGNOSIS — R9431 Abnormal electrocardiogram [ECG] [EKG]: Secondary | ICD-10-CM | POA: Diagnosis not present

## 2025-01-07 DIAGNOSIS — Z5941 Food insecurity: Secondary | ICD-10-CM

## 2025-01-07 LAB — POCT URINALYSIS DIP (CLINITEK)
Bilirubin, UA: NEGATIVE
Blood, UA: NEGATIVE
Glucose, UA: NEGATIVE mg/dL
Ketones, POC UA: NEGATIVE mg/dL
Leukocytes, UA: NEGATIVE
Nitrite, UA: NEGATIVE
POC PROTEIN,UA: NEGATIVE
Spec Grav, UA: 1.01
Urobilinogen, UA: 0.2 U/dL
pH, UA: 6

## 2025-01-07 MED ORDER — MUPIROCIN 2 % EX OINT
1.0000 | TOPICAL_OINTMENT | Freq: Two times a day (BID) | CUTANEOUS | 0 refills | Status: AC
  Filled 2025-01-07: qty 22, 11d supply, fill #0

## 2025-01-07 NOTE — Progress Notes (Signed)
 "  Subjective   Patient ID: Todd Carroll, male    DOB: August 03, 1976, 49 y.o.   MRN: [id number]  Chief Complaint  Patient presents with   Chest Pain    With left shoulder pain    Referring provider: Oley Bascom RAMAN, NP  Todd Carroll is a 49 y.o. 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  He states he has been having chest pain and left arm numbness.  He did have a fall a few months ago and has been continuing to have left shoulder pain from the fall.  He has been following with orthopedics and physical therapy.  We will place a referral for him for neurology.  We will check a EKG due to tachycardia in office today.  We will check labs.  Patient does complain also of some urinary burning sensation.  We will check a UA today. Denies f/c/s, n/v/d, hemoptysis, PND, leg swelling Denies chest pain or edema  EKG: NSR - left atrial enlargement   Allergies[1]  Immunization History  Administered Date(s) Administered   HPV 9-valent 05/15/2018, 06/13/2018   Hepatitis A 04/02/2013   Hepatitis A, Adult 10/23/2013   Hepatitis B 04/02/2013, 06/02/2013   Hepatitis B, ADULT 01/05/2014   Influenza Split 09/14/2011   Influenza, Seasonal, Injecte, Preservative Fre 02/05/2024, 09/11/2024   Influenza,inj,Quad PF,6+ Mos 10/23/2013, 11/01/2014, 11/15/2015, 08/31/2016, 08/31/2016, 07/24/2017, 09/11/2018, 09/16/2020, 11/02/2021   Meningococcal Mcv4o 03/06/2017, 09/12/2018, 04/16/2024   PFIZER(Purple Top)SARS-COV-2 Vaccination 03/23/2020, 04/12/2020, 11/08/2020   PNEUMOCOCCAL CONJUGATE-20  04/16/2024   PPD Test 03/19/2013   Pfizer(Comirnaty)Fall Seasonal Vaccine 12 years and older 09/11/2024   Pneumococcal Conjugate-13 05/15/2018   Pneumococcal Polysaccharide-23 03/19/2013, 12/31/2018   Td 12/03/2006   Tdap 07/24/2017, 07/14/2023   Vaccinia,smallpox Monkeypox Vaccine Live,pf 08/03/2021   Zoster Recombinant(Shingrix ) 04/16/2024    Tobacco History: Tobacco Use History[2] Counseling given: Not Answered   Outpatient Encounter Medications as of 01/07/2025  Medication Sig   albuterol  (VENTOLIN  HFA) 108 (90 Base) MCG/ACT inhaler Inhale 1-2 puffs into the lungs every 6 (six) hours as needed for wheezing or shortness of breath.   bictegravir-emtricitabine -tenofovir  AF (BIKTARVY ) 50-200-25 MG TABS tablet Take 1 tablet by mouth daily.   clindamycin  (CLEOCIN  T) 1 % lotion Apply topically 2 (two) times daily.   cyclobenzaprine  (FLEXERIL ) 10 MG tablet Take 1 tablet (10 mg total) by mouth 3 (three) times daily as needed for muscle spasms.   cycloSPORINE  (RESTASIS ) 0.05 % ophthalmic emulsion Place 1 drop into both eyes 2 (two) times daily.   diclofenac  Sodium (VOLTAREN ) 1 % GEL Apply 2 grams topically 4 (four) times daily.   doxycycline  (VIBRA -TABS) 100 MG tablet Take 1 tablet (100 mg total) by mouth 2 (two) times daily.   hydrOXYzine  (VISTARIL ) 50 MG capsule TAKE 1 CAPSULE(50 MG) BY MOUTH THREE TIMES DAILY AS NEEDED   hydrOXYzine  (VISTARIL ) 50 MG capsule Take 1 capsule (50 mg total) by mouth 3 (three) times daily as needed.   Lifitegrast  (XIIDRA ) 5 % SOLN Place 1 drop into both eyes 2 (two) times daily.  meloxicam  (MOBIC ) 15 MG tablet Take 1 tablet (15 mg total) by mouth daily.   moxifloxacin  (VIGAMOX ) 0.5 % ophthalmic solution Place 1 drop into both eyes 3 (three) times daily.   mupirocin  ointment (BACTROBAN ) 2 % Apply 1 Application topically 2 (two) times daily. Apply small amount inside nose   omeprazole  (PRILOSEC) 40 MG capsule Take 1 capsule (40 mg total) by mouth 2 (two) times  daily before a meal.   rosuvastatin  (CRESTOR ) 10 MG tablet Take 1 tablet (10 mg total) by mouth daily.   Sodium Fluoride  (PREVIDENT  5000 BOOSTER PLUS) 1.1 % PSTE USE AS DIRECTED: BRUSH ONTO TEETH FOR NIGHT TIME MOUTH CARE . SPIT OUT EXCESS. DO NOT RINSE.   Sodium Fluoride  (PREVIDENT  5000 BOOSTER PLUS) 1.1 % PSTE Brush on teeth for Pm mouth care. Spit out excess. Do not rinse.   sodium fluoride  (PREVIDENT  5000 PLUS) 1.1 % CREA dental cream Use a pea sized amount of this toothpaste in the evening   tiZANidine  (ZANAFLEX ) 4 MG tablet Take 1 tablet (4 mg total) by mouth 2 (two) times daily as needed.   triamcinolone  (KENALOG ) 0.025 % ointment Apply topically 2 (two) times daily.   triamcinolone  ointment (KENALOG ) 0.5 % Apply 1 Application topically 2 (two) times daily.   valACYclovir  (VALTREX ) 500 MG tablet Take 1 tablet (500 mg total) by mouth daily.   No facility-administered encounter medications on file as of 01/07/2025.    Review of Systems  Review of Systems  Constitutional: Negative.   HENT: Negative.    Cardiovascular:  Positive for chest pain.  Gastrointestinal: Negative.   Genitourinary:  Positive for dysuria.  Allergic/Immunologic: Negative.   Neurological: Negative.   Psychiatric/Behavioral: Negative.       Objective:   BP 103/80   Pulse (!) 102   Wt 244 lb (110.7 kg)   SpO2 94%   BMI 32.19 kg/m   Wt Readings from Last 5 Encounters:  01/07/25 244 lb (110.7 kg)  12/31/24 245 lb (111.1 kg)  12/25/24 245 lb (111.1 kg)  12/07/24 235 lb (106.6 kg)  11/17/24 235 lb (106.6 kg)     Physical Exam Vitals and nursing note reviewed.  Constitutional:      General: He is not in acute distress.    Appearance: He is well-developed.  Cardiovascular:     Rate and Rhythm: Normal rate and regular rhythm.  Pulmonary:     Effort: Pulmonary effort is normal.     Breath sounds: Normal breath sounds.  Skin:    General: Skin is warm and dry.  Neurological:     Mental Status: He is  alert and oriented to person, place, and time.       Assessment & Plan:   Food insecurity  Left arm numbness -     Ambulatory referral to Neurology  Chest pain, unspecified type -     EKG 12-Lead -     CBC -     Comprehensive metabolic panel with GFR  Dysuria -     POCT URINALYSIS DIP (CLINITEK)     Return if symptoms worsen or fail to improve.   Bascom GORMAN Borer, NP 01/07/2025     [1]  Allergies Allergen Reactions   Azithromycin  Itching  [2]  Social History Tobacco Use  Smoking Status Never  Smokeless Tobacco Never   "

## 2025-01-07 NOTE — Telephone Encounter (Signed)
 I spoke with patient to let him know that Dr.Knight is going to call patient's Physical Therapist to talk about patient's need for CT of jaw. Patient understood.

## 2025-01-07 NOTE — Progress Notes (Unsigned)
 "   Cardiology Clinic Note   Patient Name: Todd Carroll Date of Encounter: 01/08/2025  Primary Care Provider:  Oley Bascom RAMAN, NP Primary Cardiologist:  None  Patient Profile    Todd Carroll 49 year old male presents to the clinic today for evaluation of his chest discomfort and abnormal EKG.  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    He was seen and evaluated on 01/07/2025 by Mabel NP.  His blood pressure was noted to be 103/80.  He reported a fall a few months prior.  He noted continued left shoulder pain from his fall.  He had been following with orthopedics and physical therapy.  His EKG showed tachycardia and LAE.SABRA  He denied fever and chills, nausea vomiting diarrhea, hemoptysis, lower extremity edema, and PND.  He denied chest pain.  He was referred to cardiology for further evaluation.  He presents to the clinic today for evaluation and to establish care.  He states since he fell he has been having tingling in his bilateral arms.  He notes occasional episodes of chest discomfort.  These happen after he sneezes and last for 1 minute.  Episodes of discomfort dissipate with rest.  He reports dizziness 2-3 times per week.  He reports that he has been maintaining his hydration.  We reviewed his clinic visit with his PCP from the previous day.  I reassured  him that his chest discomfort is not related to cardiac issues.  He does note that he had coffee today.  We reviewed his EKG and I repeated EKG today.  It shows a sinus tachycardia 101 bpm.  I will order a magnesium  today, order a 7-day cardiac event monitor and plan follow-up in about 8 weeks.  I will ask him to avoid caffeine, chocolate, EtOH and maintain his hydration..  Today he denies chest pain, shortness of breath, lower extremity edema, fatigue, palpitations, melena, hematuria, hemoptysis, diaphoresis, weakness, and syncope.    Home Medications    Prior to Admission medications  Medication Sig Start Date End Date Taking? Authorizing Provider  albuterol  (VENTOLIN  HFA) 108 (90 Base) MCG/ACT inhaler Inhale 1-2 puffs into the lungs every 6 (six) hours as needed for wheezing or shortness of breath. 02/05/24   Oley Bascom RAMAN, NP  bictegravir-emtricitabine -tenofovir  AF (BIKTARVY ) 50-200-25 MG TABS tablet Take 1 tablet by mouth daily. 12/25/24   Luiz Channel, MD  clindamycin  (CLEOCIN  T) 1 % lotion Apply topically 2 (two) times daily. 12/25/24   Luiz Channel, MD  cyclobenzaprine  (FLEXERIL ) 10 MG tablet Take 1 tablet (10 mg total) by mouth 3 (three) times daily as needed for muscle spasms. 12/15/24   Oley Bascom RAMAN, NP  cycloSPORINE  (RESTASIS ) 0.05 % ophthalmic emulsion Place 1 drop into both eyes 2 (two) times daily. 11/02/24     diclofenac  Sodium (  VOLTAREN ) 1 % GEL Apply 2 grams topically 4 (four) times daily. 11/18/24   Oley Bascom RAMAN, NP  doxycycline  (VIBRA -TABS) 100 MG tablet Take 1 tablet (100 mg total) by mouth 2 (two) times daily. 11/16/24   Janit Thresa HERO, DPM  hydrOXYzine  (VISTARIL ) 50 MG capsule TAKE 1 CAPSULE(50 MG) BY MOUTH THREE TIMES DAILY AS NEEDED 08/13/24   Nichols, Tonya S, NP  hydrOXYzine  (VISTARIL ) 50 MG capsule Take 1 capsule (50 mg total) by mouth 3 (three) times daily as needed. 12/07/24 03/07/25  Oley Bascom RAMAN, NP  Lifitegrast  (XIIDRA ) 5 % SOLN Place 1 drop into both  eyes 2 (two) times daily. 09/30/24     meloxicam  (MOBIC ) 15 MG tablet Take 1 tablet (15 mg total) by mouth daily. 01/05/25   Jerri Kay HERO, MD  moxifloxacin  (VIGAMOX ) 0.5 % ophthalmic solution Place 1 drop into both eyes 3 (three) times daily. 01/01/25   Oley Bascom RAMAN, NP  mupirocin  ointment (BACTROBAN ) 2 % Apply 1 Application topically 2 (two) times daily. Apply small amount inside nose 01/07/25   Anice Riis, DO  omeprazole  (PRILOSEC) 40 MG capsule Take 1 capsule (40 mg total) by mouth 2 (two) times daily before a meal. 12/31/24   Anice Riis, DO  rosuvastatin  (CRESTOR ) 10 MG tablet Take 1 tablet (10 mg total) by mouth daily. 12/07/24   Oley Bascom RAMAN, NP  Sodium Fluoride  (PREVIDENT  5000 BOOSTER PLUS) 1.1 % PSTE USE AS DIRECTED: BRUSH ONTO TEETH FOR NIGHT TIME MOUTH CARE . SPIT OUT EXCESS. DO NOT RINSE. 11/03/24     Sodium Fluoride  (PREVIDENT  5000 BOOSTER PLUS) 1.1 % PSTE Brush on teeth for Pm mouth care. Spit out excess. Do not rinse. 01/06/25     sodium fluoride  (PREVIDENT  5000 PLUS) 1.1 % CREA dental cream Use a pea sized amount of this toothpaste in the evening 09/11/24   Luiz Channel, MD  tiZANidine  (ZANAFLEX ) 4 MG tablet Take 1 tablet (4 mg total) by mouth 2 (two) times daily as needed. 12/24/24   Oley Bascom RAMAN, NP  triamcinolone  (KENALOG ) 0.025 % ointment Apply topically 2 (two) times daily. 09/11/24 03/10/25  Luiz Channel, MD  triamcinolone  ointment (KENALOG ) 0.5 % Apply 1 Application topically 2 (two) times daily. 12/15/24   Oley Bascom RAMAN, NP  valACYclovir  (VALTREX ) 500 MG tablet Take 1 tablet (500 mg total) by mouth daily. 10/07/24   Luiz Channel, MD    Family History    Family History  Problem Relation Age of Onset   Multiple myeloma Mother    Diabetes Mother    Colon cancer Neg Hx    Esophageal cancer Neg Hx    Stomach cancer Neg Hx    Pancreatic cancer Neg Hx    Liver disease Neg Hx    Rectal cancer Neg Hx    He indicated that the status of his mother is  unknown. He indicated that the status of his neg hx is unknown.  Social History    Social History   Socioeconomic History   Marital status: Single    Spouse name: Not on file   Number of children: Not on file   Years of education: Not on file   Highest education level: Not on file  Occupational History   Not on file  Tobacco Use   Smoking status: Never   Smokeless tobacco: Never  Vaping Use   Vaping status: Never Used  Substance and Sexual Activity   Alcohol use: Yes    Comment: occasionally   Drug  use: No   Sexual activity: Not Currently    Partners: Female, Male    Birth control/protection: Condom    Comment: condoms given  Other Topics Concern   Not on file  Social History Narrative   Not on file   Social Drivers of Health   Tobacco Use: Low Risk (01/07/2025)   Patient History    Smoking Tobacco Use: Never    Smokeless Tobacco Use: Never    Passive Exposure: Not on file  Financial Resource Strain: Not on file  Food Insecurity: Food Insecurity Present (01/07/2025)   Epic    Worried About Programme Researcher, Broadcasting/film/video in the Last Year: Sometimes true    Ran Out of Food in the Last Year: Sometimes true  Transportation Needs: No Transportation Needs (12/07/2024)   Epic    Lack of Transportation (Medical): No    Lack of Transportation (Non-Medical): No  Physical Activity: Not on file  Stress: Not on file  Social Connections: Unknown (04/06/2022)   Received from [facility]   Social Network    Social Network: Not on file  Intimate Partner Violence: Not At Risk (12/07/2024)   Epic    Fear of Current or Ex-Partner: No    Emotionally Abused: No    Physically Abused: No    Sexually Abused: No  Depression (PHQ2-9): Low Risk (01/07/2025)   Depression (PHQ2-9)    PHQ-2 Score: 2  Alcohol Screen: Not on file  Housing: Low Risk (12/07/2024)   Epic    Unable to Pay for Housing in the Last Year: No    Number of Times Moved in the Last Year: 0    Homeless in the Last Year: No   Utilities: Not At Risk (12/07/2024)   Epic    Threatened with loss of utilities: No  Health Literacy: Not on file     Review of Systems    General:  No chills, fever, night sweats or weight changes.  Cardiovascular:  No chest pain, dyspnea on exertion, edema, orthopnea, palpitations, paroxysmal nocturnal dyspnea. Dermatological: No rash, lesions/masses Respiratory: No cough, dyspnea Urologic: No hematuria, dysuria Abdominal:   No nausea, vomiting, diarrhea, bright red blood per rectum, melena, or hematemesis Neurologic:  No visual changes, wkns, changes in mental status. All other systems reviewed and are otherwise negative except as noted above.  Physical Exam    VS:  BP (!) 130/90   Pulse (!) 109   Ht 6' 2 (1.88 m)   Wt 242 lb 1.3 oz (109.8 kg)   BMI 31.08 kg/m  , BMI Body mass index is 31.08 kg/m. GEN: Well nourished, well developed, in no acute distress. HEENT: normal. Neck: Supple, no JVD, carotid bruits, or masses. Cardiac: RRR, no murmurs, rubs, or gallops. No clubbing, cyanosis, edema.  Radials/DP/PT 2+ and equal bilaterally.  Respiratory:  Respirations regular and unlabored, clear to auscultation bilaterally. GI: Soft, nontender, nondistended, BS + x 4. MS: no deformity or atrophy. Skin: warm and dry, no rash. Neuro:  Strength and sensation are intact. Psych: Normal affect.  Accessory Clinical Findings    Recent Labs: 02/05/2024: TSH 1.420 01/07/2025: ALT 28; BUN 20; Creatinine, Ser 1.35; Hemoglobin 14.0; Platelets 397; Potassium 4.8; Sodium 137   Recent Lipid Panel    Component Value Date/Time   CHOL 197 02/05/2024 1150   TRIG 290 (H) 02/05/2024 1150   HDL 51 02/05/2024 1150   CHOLHDL 3.9 02/05/2024 1150   CHOLHDL 3.9 02/04/2023 1513   VLDL 27 07/08/2017 0958   LDLCALC  97 02/05/2024 1150   LDLCALC 131 (H) 02/04/2023 1513    HYPERTENSION CONTROL Vitals:   01/08/25 0837  BP: (!) 130/90    The patient's blood pressure is elevated above target  today.  In order to address the patient's elevated BP: Blood pressure will be monitored at home to determine if medication changes need to be made.       ECG personally reviewed by me today- EKG Interpretation Date/Time:  Friday January 08 2025 08:49:32 EST Ventricular Rate:  101 PR Interval:  150 QRS Duration:  92 QT Interval:  358 QTC Calculation: 464 R Axis:   -33  Text Interpretation: Sinus tachycardia Left axis deviation Minimal voltage criteria for LVH, may be normal variant ( R in aVL ) Nonspecific T wave abnormality When compared with ECG of 31-Jul-2018 13:40, No significant change was found Confirmed by Emelia Hazy [phone]) on 01/08/2025 8:51:10 AM         Assessment & Plan   1.  Abnormal EKG, tachycardia-EKG today shows sinus tachycardia.  He does note frequent episodes of dizziness that are short-lived and happen 2-3 times per week.  He denies palpitations.  CBC reassuring, metabolic panel reassuring Heart healthy low-sodium diet Avoid triggers caffeine, chocolate, EtOH, dehydration excetra. Order 7-day cardiac event monitor Order magnesium   Chest discomfort-discomfort appears to be musculoskeletal in nature.  Notes chest discomfort with sneezing.  Last for 1-2 minutes and then dissipates Continue physical therapy Rest, compression,: Warm compress May use Tylenol  for pain.-Recommend less than 3 g daily.  GERD-well-controlled with omeprazole .  Does note episodes of nauseousness when he does not take antacid medication. Follows with PCP GERD diet instructions    Disposition: Follow-up with Dr. Floretta or me in 8 weeks.   Hazy HERO. Cosimo Schertzer NP-C     01/08/2025, 9:14 AM [facility] [address] [place], [place] [address] Office [phone]    Notice: This dictation was prepared with Dragon dictation along with smaller phrase technology. Any transcriptional errors that result from this process are unintentional and  may not be corrected upon review.   I spent 14 minutes examining this patient, reviewing medications, and using patient centered shared decision making involving their cardiac care.   I spent  20 minutes reviewing past medical history,  medications, and prior cardiac tests.     [1]  Allergies Allergen Reactions   Azithromycin  Itching   "

## 2025-01-08 ENCOUNTER — Encounter: Payer: Self-pay | Admitting: General Practice

## 2025-01-08 ENCOUNTER — Encounter: Payer: Self-pay | Admitting: Gastroenterology

## 2025-01-08 ENCOUNTER — Other Ambulatory Visit (INDEPENDENT_AMBULATORY_CARE_PROVIDER_SITE_OTHER): Payer: Self-pay

## 2025-01-08 ENCOUNTER — Other Ambulatory Visit: Payer: Self-pay | Admitting: General Practice

## 2025-01-08 ENCOUNTER — Ambulatory Visit: Admitting: General Practice

## 2025-01-08 ENCOUNTER — Ambulatory Visit

## 2025-01-08 ENCOUNTER — Other Ambulatory Visit: Payer: Self-pay

## 2025-01-08 VITALS — BP 130/90 | HR 109 | Ht 74.0 in | Wt 242.1 lb

## 2025-01-08 DIAGNOSIS — R0789 Other chest pain: Secondary | ICD-10-CM

## 2025-01-08 DIAGNOSIS — K219 Gastro-esophageal reflux disease without esophagitis: Secondary | ICD-10-CM

## 2025-01-08 DIAGNOSIS — R9431 Abnormal electrocardiogram [ECG] [EKG]: Secondary | ICD-10-CM

## 2025-01-08 DIAGNOSIS — R000 Tachycardia, unspecified: Secondary | ICD-10-CM

## 2025-01-08 LAB — COMPREHENSIVE METABOLIC PANEL WITH GFR
ALT: 28 [IU]/L (ref 0–44)
AST: 25 [IU]/L (ref 0–40)
Albumin: 4.8 g/dL (ref 4.1–5.1)
Alkaline Phosphatase: 72 [IU]/L (ref 47–123)
BUN/Creatinine Ratio: 15 (ref 9–20)
BUN: 20 mg/dL (ref 6–24)
Bilirubin Total: 0.3 mg/dL (ref 0.0–1.2)
CO2: 21 mmol/L (ref 20–29)
Calcium: 9.8 mg/dL (ref 8.7–10.2)
Chloride: 99 mmol/L (ref 96–106)
Creatinine, Ser: 1.35 mg/dL — ABNORMAL HIGH (ref 0.76–1.27)
Globulin, Total: 3 g/dL (ref 1.5–4.5)
Glucose: 98 mg/dL (ref 70–99)
Potassium: 4.8 mmol/L (ref 3.5–5.2)
Sodium: 137 mmol/L (ref 134–144)
Total Protein: 7.8 g/dL (ref 6.0–8.5)
eGFR: 65 mL/min/{1.73_m2}

## 2025-01-08 LAB — CBC
Hematocrit: 41.5 % (ref 37.5–51.0)
Hemoglobin: 14 g/dL (ref 13.0–17.7)
MCH: 30.6 pg (ref 26.6–33.0)
MCHC: 33.7 g/dL (ref 31.5–35.7)
MCV: 91 fL (ref 79–97)
Platelets: 397 10*3/uL (ref 150–450)
RBC: 4.58 x10E6/uL (ref 4.14–5.80)
RDW: 13 % (ref 11.6–15.4)
WBC: 5.9 10*3/uL (ref 3.4–10.8)

## 2025-01-08 NOTE — Progress Notes (Unsigned)
 Enrolled patient for a 7 day Zio XT monitor to be mailed to patients home   Floretta to read

## 2025-01-08 NOTE — Progress Notes (Signed)
 Blood pressure well-controlled at clinic visit yesterday.  Patient had coffee prior to appointment today.

## 2025-01-08 NOTE — Patient Instructions (Addendum)
 Medication Instructions:  Your physician recommends that you continue on your current medications as directed. Please refer to the Current Medication list given to you today.  *If you need a refill on your cardiac medications before your next appointment, please call your pharmacy*  Lab Work: Your physician recommends that you have lab work: Magnesium   If you have labs (blood work) drawn today and your tests are completely normal, you will receive your results only by: MyChart Message (if you have MyChart) OR A paper copy in the mail If you have any lab test that is abnormal or we need to change your treatment, we will call you to review the results.  Testing/Procedures: Your physician has recommended that you wear an 7 day monitor. Monitors are medical devices that record the hearts electrical activity. Doctors most often us  these monitors to diagnose arrhythmias. Arrhythmias are problems with the speed or rhythm of the heartbeat. The monitor is a small, portable device. You can wear one while you do your normal daily activities. This is usually used to diagnose what is causing palpitations/syncope (passing out).    Follow-Up: At [facility], you and your health needs are our priority.  As part of our continuing mission to provide you with exceptional heart care, our providers are all part of one team.  This team includes your primary Cardiologist (physician) and Advanced Practice Providers or APPs (Physician Assistants and Nurse Practitioners) who all work together to provide you with the care you need, when you need it.  Your next appointment:   8 week(s)  Provider:   Josefa Beauvais, NP    We recommend signing up for the patient portal called MyChart.  Sign up information is provided on this After Visit Summary.  MyChart is used to connect with patients for Virtual Visits (Telemedicine).  Patients are able to view lab/test results, encounter notes, upcoming appointments, etc.   Non-urgent messages can be sent to your provider as well.   To learn more about what you can do with MyChart, go to [email or web address].   Other Instructions Your provider advises to avoid alcohol, caffeine, and any stimulants. Please stay hydrated with 8 glasses of water a day.  ZIO XT- Long Term Monitor Instructions  Your physician has requested you wear a ZIO patch monitor for 7  days.  This is a single patch monitor. [employer] supplies one patch monitor per enrollment. Additional stickers are not available. Please do not apply patch if you will be having a Nuclear Stress Test,  Echocardiogram, Cardiac CT, MRI, or Chest Xray during the period you would be wearing the  monitor. The patch cannot be worn during these tests. You cannot remove and re-apply the  ZIO XT patch monitor.  Your ZIO patch monitor will be mailed 3 day USPS to your address on file. It may take 3-5 days  to receive your monitor after you have been enrolled.  Once you have received your monitor, please review the enclosed instructions. Your monitor  has already been registered assigning a specific monitor serial # to you.  Billing and Patient Assistance Program Information  We have supplied [employer] with any of your insurance information on file for billing purposes. [employer] offers a sliding scale Patient Assistance Program for patients that do not have  insurance, or whose insurance does not completely cover the cost of the ZIO monitor.  You must apply for the Patient Assistance Program to qualify for this discounted rate.  To apply, please call [employer] at  [phone], select option 4, select option 2, ask to apply for  Patient Assistance Program. Meredeth will ask your household income, and how many people  are in your household. They will quote your out-of-pocket cost based on that information.  [employer] will also be able to set up a 21-month, interest-free payment plan if needed.  Applying the monitor   Shave  hair from upper left chest.  Hold abrader disc by orange tab. Rub abrader in 40 strokes over the upper left chest as  indicated in your monitor instructions.  Clean area with 4 enclosed alcohol pads. Let dry.  Apply patch as indicated in monitor instructions. Patch will be placed under collarbone on left  side of chest with arrow pointing upward.  Rub patch adhesive wings for 2 minutes. Remove white label marked 1. Remove the white  label marked 2. Rub patch adhesive wings for 2 additional minutes.  While looking in a mirror, press and release button in center of patch. A small green light will  flash 3-4 times. This will be your only indicator that the monitor has been turned on.  Do not shower for the first 24 hours. You may shower after the first 24 hours.  Press the button if you feel a symptom. You will hear a small click. Record Date, Time and  Symptom in the Patient Logbook.  When you are ready to remove the patch, follow instructions on the last 2 pages of Patient  Logbook. Stick patch monitor onto the last page of Patient Logbook.  Place Patient Logbook in the blue and white box. Use locking tab on box and tape box closed  securely. The blue and white box has prepaid postage on it. Please place it in the mailbox as  soon as possible. Your physician should have your test results approximately 7 days after the  monitor has been mailed back to [facility].  Call [facility] Customer Care at [phone] if you have questions regarding  your ZIO XT patch monitor. Call them immediately if you see an orange light blinking on your  monitor.  If your monitor falls off in less than 4 days, contact our Monitor department at [phone].  If your monitor becomes loose or falls off after 4 days call [employer] at [phone] for  suggestions on securing your monitor

## 2025-01-12 ENCOUNTER — Ambulatory Visit (HOSPITAL_COMMUNITY)

## 2025-01-17 ENCOUNTER — Other Ambulatory Visit

## 2025-01-19 ENCOUNTER — Ambulatory Visit: Admitting: Physical Therapy

## 2025-01-20 ENCOUNTER — Ambulatory Visit: Admitting: Nurse Practitioner

## 2025-02-03 ENCOUNTER — Ambulatory Visit: Admitting: Gastroenterology

## 2025-02-08 ENCOUNTER — Ambulatory Visit (INDEPENDENT_AMBULATORY_CARE_PROVIDER_SITE_OTHER): Admitting: Audiology

## 2025-03-10 ENCOUNTER — Ambulatory Visit: Payer: Self-pay | Admitting: Nurse Practitioner

## 2025-03-10 ENCOUNTER — Ambulatory Visit: Admitting: Nurse Practitioner

## 2025-03-11 ENCOUNTER — Ambulatory Visit: Admitting: General Practice

## 2025-03-12 ENCOUNTER — Ambulatory Visit: Admitting: Neurology

## 2025-06-09 ENCOUNTER — Other Ambulatory Visit: Payer: Self-pay

## 2025-06-23 ENCOUNTER — Ambulatory Visit: Payer: Self-pay | Admitting: Internal Medicine
